# Patient Record
Sex: Male | Born: 1940 | Race: White | Hispanic: No | Smoking: Current every day smoker
Health system: Southern US, Community
[De-identification: ages and names within clinical notes are randomized; demographics above are authoritative.]

## PROBLEM LIST (undated history)

## (undated) DIAGNOSIS — F329 Major depressive disorder, single episode, unspecified: Secondary | ICD-10-CM

## (undated) DIAGNOSIS — F32A Depression, unspecified: Secondary | ICD-10-CM

## (undated) DIAGNOSIS — E785 Hyperlipidemia, unspecified: Secondary | ICD-10-CM

## (undated) DIAGNOSIS — E119 Type 2 diabetes mellitus without complications: Secondary | ICD-10-CM

## (undated) DIAGNOSIS — J189 Pneumonia, unspecified organism: Secondary | ICD-10-CM

## (undated) DIAGNOSIS — I480 Paroxysmal atrial fibrillation: Secondary | ICD-10-CM

## (undated) DIAGNOSIS — J449 Chronic obstructive pulmonary disease, unspecified: Secondary | ICD-10-CM

## (undated) DIAGNOSIS — G4700 Insomnia, unspecified: Secondary | ICD-10-CM

## (undated) DIAGNOSIS — I10 Essential (primary) hypertension: Secondary | ICD-10-CM

## (undated) DIAGNOSIS — M1990 Unspecified osteoarthritis, unspecified site: Secondary | ICD-10-CM

## (undated) HISTORY — DX: Essential (primary) hypertension: I10

## (undated) HISTORY — DX: Hyperlipidemia, unspecified: E78.5

## (undated) HISTORY — PX: REPLACEMENT TOTAL KNEE: SUR1224

## (undated) HISTORY — DX: Insomnia, unspecified: G47.00

## (undated) HISTORY — PX: EYE SURGERY: SHX253

## (undated) HISTORY — DX: Unspecified osteoarthritis, unspecified site: M19.90

## (undated) HISTORY — DX: Major depressive disorder, single episode, unspecified: F32.9

## (undated) HISTORY — DX: Depression, unspecified: F32.A

## (undated) HISTORY — PX: JOINT REPLACEMENT: SHX530

---

## 1998-05-09 DIAGNOSIS — M1990 Unspecified osteoarthritis, unspecified site: Secondary | ICD-10-CM

## 1998-05-09 HISTORY — DX: Unspecified osteoarthritis, unspecified site: M19.90

## 2003-05-10 DIAGNOSIS — G4700 Insomnia, unspecified: Secondary | ICD-10-CM

## 2003-05-10 HISTORY — DX: Insomnia, unspecified: G47.00

## 2009-05-09 HISTORY — PX: OTHER SURGICAL HISTORY: SHX169

## 2011-07-01 ENCOUNTER — Encounter: Payer: Self-pay | Admitting: Physician Assistant

## 2011-07-01 ENCOUNTER — Ambulatory Visit (INDEPENDENT_AMBULATORY_CARE_PROVIDER_SITE_OTHER): Payer: Medicare Other | Admitting: Physician Assistant

## 2011-07-01 VITALS — BP 121/79 | HR 94 | Ht 70.5 in | Wt 205.0 lb

## 2011-07-01 DIAGNOSIS — R1031 Right lower quadrant pain: Secondary | ICD-10-CM

## 2011-07-01 DIAGNOSIS — Z7689 Persons encountering health services in other specified circumstances: Secondary | ICD-10-CM

## 2011-07-01 DIAGNOSIS — Z23 Encounter for immunization: Secondary | ICD-10-CM

## 2011-07-01 DIAGNOSIS — N529 Male erectile dysfunction, unspecified: Secondary | ICD-10-CM

## 2011-07-01 MED ORDER — AMBULATORY NON FORMULARY MEDICATION: 1.0000 | Freq: Once | Status: DC

## 2011-07-01 MED ORDER — TADALAFIL 2.5 MG PO TABS: 1.0000 | ORAL_TABLET | Freq: Every day | ORAL | Status: DC

## 2011-07-01 NOTE — Patient Instructions (Signed)
Gave rx for shingles vaccine to get at local pharmacy. Stop Viagra and start Cialis daily. Recheck in 1 month to see how he is doing. Gave pneumonia vaccine in office.

## 2011-07-01 NOTE — Progress Notes (Signed)
  Subjective:    Patient ID: Raymond Hardin, male    DOB: 1940-10-28, 71 y.o.   MRN: [id number]  HPI Patient present to clinic to establish care. Patient has history of ED, hypertension, PTSD, and chronic back pain. Patient reports that viagra is not working and he wants to switch to Cialis because he found a free trial offer. He is also having pain in his right groin. Certain movements make it worse. Rest makes it better. He has not tried anything to make better. He just got a new puppy and he thinks the extra movement might have strain him. He denies any bulge that increases with lifting. He denies any heavy lifting.    Review of Systems     Objective:   Physical Exam  Constitutional: He is oriented to person, place, and time. He appears well-developed and well-nourished.  HENT:  Head: Normocephalic and atraumatic.  Cardiovascular: Normal rate, regular rhythm, normal heart sounds and intact distal pulses.   Pulmonary/Chest: Effort normal.       Coarse breath sounds.  Genitourinary: Penis normal.       No hernia palpated on either side.   Neurological: He is alert and oriented to person, place, and time.  Psychiatric: He has a normal mood and affect. His behavior is normal.          Assessment & Plan:  Right groin pain- REst, consider heat applied to area and warm baths. Educated to be aware of any buldge in the inguinal area to call if it came out and stayed out not going back in. Heavy lifting can cause hernias so be mindful when lifting.   ED- Cialis prescription given. Discussed side effects of cialis and to never use with viagra. F/U in one month.    Pneumonia Vaccine given. GAve rx to get zostavax.

## 2011-07-29 ENCOUNTER — Ambulatory Visit: Payer: Medicare Other | Admitting: Physician Assistant

## 2011-08-15 ENCOUNTER — Ambulatory Visit: Payer: Medicare Other | Admitting: Physician Assistant

## 2011-09-09 ENCOUNTER — Ambulatory Visit (INDEPENDENT_AMBULATORY_CARE_PROVIDER_SITE_OTHER): Payer: Medicare Other | Admitting: Physician Assistant

## 2011-09-09 ENCOUNTER — Encounter: Payer: Self-pay | Admitting: Physician Assistant

## 2011-09-09 VITALS — BP 138/82 | HR 100 | Ht 70.5 in | Wt 205.0 lb

## 2011-09-09 DIAGNOSIS — N529 Male erectile dysfunction, unspecified: Secondary | ICD-10-CM

## 2011-09-09 DIAGNOSIS — I10 Essential (primary) hypertension: Secondary | ICD-10-CM

## 2011-09-09 MED ORDER — TADALAFIL 2.5 MG PO TABS: 1.0000 | ORAL_TABLET | Freq: Every day | ORAL | Status: DC

## 2011-09-09 NOTE — Progress Notes (Signed)
  Subjective:    Patient ID: Raymond Hardin, male    DOB: Jul 27, 1940, 71 y.o.   MRN: [id number]  HPI Patient presents to the clinic for 1 month follow up on Cialis. He is very happy with results and reports no side effects. Takes Cialis daily and has had no HA's. BP was high today when taken in triage. Recheck manually and got a better reading 138/82. Blood pressure, depression and pain are managed by Dr. Severiano Gilbert and the [place].     Review of Systems     Objective:   Physical Exam  Constitutional: He is oriented to person, place, and time. He appears well-developed and well-nourished.  HENT:  Head: Normocephalic and atraumatic.  Cardiovascular: Normal rate, regular rhythm and normal heart sounds.   Pulmonary/Chest: Effort normal and breath sounds normal. He has no wheezes.       Coarse breath sounds bilaterally.  Neurological: He is alert and oriented to person, place, and time.  Skin: Skin is warm and dry.  Psychiatric: He has a normal mood and affect. His behavior is normal.         Assessment & Plan:  Erectile Dysfunction/Hypertension- Will recheck blood pressure in 2 weeks with nurse visit since on cialis and want to make sure BP stays controlled. Refilled Cialis today. Recheck with me in 6 months.   Zostavax prescription was given at last visit and has not gotten yet/ patient reports he will get and report to office when he does.

## 2011-09-09 NOTE — Patient Instructions (Addendum)
Refilled Cialis to take daily. Want you to come in for nurse visit in 2-4 weeks to recheck blood pressure.

## 2011-09-14 ENCOUNTER — Telehealth: Payer: Self-pay | Admitting: Physician Assistant

## 2011-09-14 NOTE — Telephone Encounter (Signed)
Called patient to see if he wanted to try a preffered drug for ED. I was not able to get an answer and not able to leave msg. He paid cash for cialis. If Patient calls back we need to find out if he has tried any other ED medications or wants to try something the insurance company will pay for.

## 2011-09-30 ENCOUNTER — Ambulatory Visit (INDEPENDENT_AMBULATORY_CARE_PROVIDER_SITE_OTHER): Payer: Medicare Other | Admitting: Physician Assistant

## 2011-09-30 ENCOUNTER — Encounter: Payer: Self-pay | Admitting: Physician Assistant

## 2011-09-30 VITALS — BP 152/92 | HR 100

## 2011-09-30 DIAGNOSIS — I10 Essential (primary) hypertension: Secondary | ICD-10-CM

## 2011-09-30 DIAGNOSIS — M7989 Other specified soft tissue disorders: Secondary | ICD-10-CM

## 2011-09-30 MED ORDER — HYDROCHLOROTHIAZIDE 25 MG PO TABS: 25.0000 mg | ORAL_TABLET | Freq: Every day | ORAL | Status: DC

## 2011-09-30 NOTE — Patient Instructions (Addendum)
Sent to pharmacy HCTZ to take 25mg  daily. Recheck in 1 weeks blood pressure with nurse visit.

## 2011-09-30 NOTE — Progress Notes (Signed)
  Subjective:    Patient ID: Raymond Hardin, male    DOB: 07-06-1940, 71 y.o.   MRN: [id number] BP check: pt is having some ankle swelling x 2 weeks HPI    Review of Systems     Objective:   Physical Exam        Assessment & Plan:  Sent rx for HCTZ 25mg  daily to pharmacy. Follow up in 1 week with nurse visit blood pressure recheck . Tandy Gaw PA-C

## 2011-10-07 ENCOUNTER — Encounter: Payer: Self-pay | Admitting: Physician Assistant

## 2011-10-07 ENCOUNTER — Ambulatory Visit (INDEPENDENT_AMBULATORY_CARE_PROVIDER_SITE_OTHER): Payer: Medicare Other | Admitting: Physician Assistant

## 2011-10-07 VITALS — BP 139/88 | HR 91

## 2011-10-07 DIAGNOSIS — R600 Localized edema: Secondary | ICD-10-CM

## 2011-10-07 DIAGNOSIS — R609 Edema, unspecified: Secondary | ICD-10-CM

## 2011-10-07 DIAGNOSIS — I10 Essential (primary) hypertension: Secondary | ICD-10-CM

## 2011-10-07 MED ORDER — METOPROLOL SUCCINATE ER 25 MG PO TB24: 25.0000 mg | ORAL_TABLET | Freq: Every day | ORAL | Status: DC

## 2011-10-07 NOTE — Progress Notes (Signed)
Pt informed

## 2011-10-07 NOTE — Progress Notes (Signed)
  Subjective:    Patient ID: Raymond Hardin, male    DOB: 1940-05-27, 71 y.o.   MRN: [id number] BP check; swelling has decreased. HPI    Review of Systems     Objective:   Physical Exam        Assessment & Plan:  HTN - BP is better but still borderline. Will add a low dose of metoprolol but he can take at bedtime. F/U with me in 2-3 weeks. We will check a BMP at that time.

## 2011-10-28 ENCOUNTER — Telehealth: Payer: Self-pay | Admitting: *Deleted

## 2011-10-28 DIAGNOSIS — I10 Essential (primary) hypertension: Secondary | ICD-10-CM

## 2011-10-28 NOTE — Telephone Encounter (Signed)
Lab order entered and given to pt

## 2011-10-29 LAB — BASIC METABOLIC PANEL WITH GFR
Calcium: 9 mg/dL (ref 8.4–10.5)
GFR, Est African American: 70 mL/min
Sodium: 139 mEq/L (ref 135–145)

## 2011-11-04 ENCOUNTER — Encounter: Payer: Self-pay | Admitting: Physician Assistant

## 2011-11-04 ENCOUNTER — Ambulatory Visit (INDEPENDENT_AMBULATORY_CARE_PROVIDER_SITE_OTHER): Payer: Medicare Other | Admitting: Physician Assistant

## 2011-11-04 VITALS — BP 111/75 | HR 83 | Ht 70.5 in | Wt 208.0 lb

## 2011-11-04 DIAGNOSIS — N529 Male erectile dysfunction, unspecified: Secondary | ICD-10-CM | POA: Insufficient documentation

## 2011-11-04 DIAGNOSIS — I10 Essential (primary) hypertension: Secondary | ICD-10-CM | POA: Insufficient documentation

## 2011-11-04 MED ORDER — METOPROLOL SUCCINATE ER 25 MG PO TB24: 25.0000 mg | ORAL_TABLET | Freq: Every day | ORAL | Status: DC

## 2011-11-04 MED ORDER — VARDENAFIL HCL 5 MG PO TABS: 5.0000 mg | ORAL_TABLET | ORAL | Status: DC | PRN

## 2011-11-04 MED ORDER — HYDROCHLOROTHIAZIDE 25 MG PO TABS: 25.0000 mg | ORAL_TABLET | Freq: Every day | ORAL | Status: DC

## 2011-11-04 NOTE — Patient Instructions (Addendum)
Try levitra. Let me know how it does. Refilled blood pressure medication. Follow up in 6 months.  [facility] and blood [facility]. Get Shingles vaccine.

## 2011-11-06 NOTE — Progress Notes (Signed)
  Subjective:    Patient ID: Raymond Hardin, male    DOB: 1940-05-14, 71 y.o.   MRN: [id number]  HPI Pt presents to the clinic to follow up on HTN and to talk about cheaper options for ED rather than Cialis. BP doing great today. Pt denies any SOB, CP, HA or palpitations. He is feeling really good. He has not been able to refill Cialis due to cost. He really wants something for ED. He needs his cholesterol checked and cannot remember when he had a regular check up. He goes to [facility] hospital and they usually manage those needs. Pt continues to smoke.  Review of Systems     Objective:   Physical Exam  Constitutional: He is oriented to person, place, and time. He appears well-developed and well-nourished.  HENT:  Head: Normocephalic and atraumatic.  Cardiovascular: Normal rate, regular rhythm, normal heart sounds and intact distal pulses.   Pulmonary/Chest: Effort normal and breath sounds normal. He has no wheezes.  Neurological: He is alert and oriented to person, place, and time.  Skin: Skin is warm and dry.  Psychiatric: He has a normal mood and affect. His behavior is normal.          Assessment & Plan:  HTN- doing great today. Refilled bp medications. Continue with current therapy. Will follow up in 6 months.  ED- Will give rx for levitra to try. Insurance may cover this rx. Will recheck in 1 month to recheck after starting new medication.  Pt was instructed to get CPE either in our office or with [facility]. He needs an up to date cholesterol and diabetes check. Already given rx for shingles shot reminded to get shot. Discussed need to quit smoking.

## 2012-05-07 ENCOUNTER — Ambulatory Visit: Payer: Medicare Other | Admitting: Physician Assistant

## 2012-05-11 ENCOUNTER — Ambulatory Visit: Payer: Medicare Other | Admitting: Physician Assistant

## 2012-05-16 ENCOUNTER — Ambulatory Visit (INDEPENDENT_AMBULATORY_CARE_PROVIDER_SITE_OTHER): Payer: Medicare Other | Admitting: Physician Assistant

## 2012-05-16 ENCOUNTER — Encounter: Payer: Self-pay | Admitting: Physician Assistant

## 2012-05-16 VITALS — BP 146/88 | HR 96 | Ht 70.5 in | Wt 223.0 lb

## 2012-05-16 DIAGNOSIS — I10 Essential (primary) hypertension: Secondary | ICD-10-CM

## 2012-05-16 DIAGNOSIS — N529 Male erectile dysfunction, unspecified: Secondary | ICD-10-CM

## 2012-05-16 MED ORDER — VARDENAFIL HCL 5 MG PO TABS: 10.0000 mg | ORAL_TABLET | ORAL | Status: DC | PRN

## 2012-05-16 NOTE — Patient Instructions (Addendum)
Increase toprol 50mg  daily. BP in 4 weeks.

## 2012-05-16 NOTE — Progress Notes (Signed)
  Subjective:    Patient ID: Raymond Hardin, male    DOB: Dec 28, 1940, 72 y.o.   MRN: [id number]  Hypertension This is a chronic problem. The current episode started more than 1 year ago. The problem has been gradually worsening since onset. The problem is uncontrolled. Pertinent negatives include no anxiety, blurred vision, chest pain, headaches, malaise/fatigue, neck pain, orthopnea, palpitations, peripheral edema, PND, shortness of breath or sweats. Agents associated with hypertension include NSAIDs. Risk factors for coronary artery disease include family history, obesity, male gender, smoking/tobacco exposure and sedentary lifestyle. Past treatments include diuretics, beta blockers and ACE inhibitors. The current treatment provides mild (Was controlled in June 2013.) improvement. Compliance problems include diet and exercise.  There is no history of kidney disease, renovascular disease or retinopathy. There is no history of a hypertension causing med.  Erectile Dysfunction This is a chronic problem. The current episode started more than 1 year ago. The problem is unchanged. The nature of his difficulty is achieving erection and maintaining erection. He reports no anxiety, decreased libido or performance anxiety. Duration of erection: with medication more than . Irritative symptoms do not include frequency, nocturia or urgency. Obstructive symptoms do not include dribbling, an intermittent stream, a slower stream, straining or a weak stream. Pertinent negatives include no chills, dysuria, genital pain, hematuria, hesitancy or inability to urinate. The symptoms are aggravated by alcohol use and medications. Past treatments include vardenafil, tadalafil and sildenafil (Cialis works the best but Orthoptist works also and needs refill). The treatment provided significant relief. He has been using treatment for 6 to 12 months. He has had no adverse reactions caused by medications. Risk factors include  hypertension.      Review of Systems  Constitutional: Negative for chills and malaise/fatigue.  HENT: Negative for neck pain.   Eyes: Negative for blurred vision.  Respiratory: Negative for shortness of breath.   Cardiovascular: Negative for chest pain, palpitations, orthopnea and PND.  Genitourinary: Negative for dysuria, hesitancy, urgency, frequency, hematuria, decreased libido and nocturia.  Neurological: Negative for headaches.       Objective:   Physical Exam  Constitutional: He is oriented to person, place, and time. He appears well-developed and well-nourished.  HENT:  Head: Normocephalic and atraumatic.  Cardiovascular: Normal rate, regular rhythm and normal heart sounds.   Pulmonary/Chest: Effort normal and breath sounds normal. He has no wheezes.  Neurological: He is alert and oriented to person, place, and time.  Skin: Skin is warm and dry.  Psychiatric: He has a normal mood and affect. His behavior is normal.          Assessment & Plan:  Hypertension-increase Toprol XL to 50 mg daily. Patient  or admits to eating a lot of salt. Encouraged patient to decrease sodium in diet into work on regular exercise. Followup in 4 weeks.   ED-new prescription was given for Levitra. Followup as needed.

## 2012-06-13 ENCOUNTER — Ambulatory Visit (INDEPENDENT_AMBULATORY_CARE_PROVIDER_SITE_OTHER): Payer: Medicare Other | Admitting: Physician Assistant

## 2012-06-13 ENCOUNTER — Encounter: Payer: Self-pay | Admitting: Physician Assistant

## 2012-06-13 VITALS — BP 136/76 | HR 97 | Wt 222.0 lb

## 2012-06-13 DIAGNOSIS — I10 Essential (primary) hypertension: Secondary | ICD-10-CM

## 2012-06-13 NOTE — Patient Instructions (Signed)
Can try voltaren up to twice a day. Continue low salt diet. Call me if want to try gel to put on joint areas that are painful.

## 2012-06-13 NOTE — Progress Notes (Signed)
  Subjective:    Patient ID: Raymond Hardin, male    DOB: 1940-09-17, 72 y.o.   MRN: [id number]  Hypertension This is a chronic problem. The current episode started more than 1 year ago. The problem has been gradually improving since onset. The problem is controlled. Pertinent negatives include no anxiety, blurred vision, chest pain, headaches, malaise/fatigue, neck pain, orthopnea, palpitations, peripheral edema, PND, shortness of breath or sweats. Agents associated with hypertension include NSAIDs. Risk factors for coronary artery disease include obesity, male gender, sedentary lifestyle and smoking/tobacco exposure. Past treatments include ACE inhibitors, beta blockers and diuretics. The current treatment provides moderate improvement. Compliance problems include diet and exercise.  There is no history of angina, kidney disease or CAD/MI.      Review of Systems  Constitutional: Negative for malaise/fatigue.  HENT: Negative for neck pain.   Eyes: Negative for blurred vision.  Respiratory: Negative for shortness of breath.   Cardiovascular: Negative for chest pain, palpitations, orthopnea and PND.  Neurological: Negative for headaches.       Objective:   Physical Exam  Constitutional: He is oriented to person, place, and time. He appears well-developed and well-nourished.  HENT:  Head: Normocephalic and atraumatic.  Cardiovascular: Normal rate, regular rhythm and normal heart sounds.   Pulmonary/Chest: Effort normal and breath sounds normal. He has no wheezes.  Neurological: He is alert and oriented to person, place, and time.  Skin: Skin is warm and dry.  Psychiatric: He has a normal mood and affect. His behavior is normal.          Assessment & Plan:  HTN- BP looks better today. Continue with current therapy. Keep low salt diet. Need to start exercising. Follow up 6 months. Keep monitoring BP away from office. Raymond Hardin keep below 140/90.  PT only comes here for HTN and BP. [facility] other conditions.

## 2012-11-15 ENCOUNTER — Other Ambulatory Visit: Payer: Self-pay | Admitting: *Deleted

## 2012-11-15 ENCOUNTER — Telehealth: Payer: Self-pay | Admitting: Physician Assistant

## 2012-11-15 ENCOUNTER — Telehealth: Payer: Self-pay | Admitting: *Deleted

## 2012-11-15 NOTE — Telephone Encounter (Signed)
Pt was in office 2 times today stating that [facility] had sent a refill request for cialis.  I spoke with pharmacy & they confirmed but no one saw the request.  My hesitation is that according to his chart Lesly Rubenstein gave him levitra but he was on cialis last year.  Are you ok with me filling this for him? Please advise

## 2012-11-15 NOTE — Telephone Encounter (Signed)
Patient came by to check status of his medication (cialis) to [facility] pharmacy. He said that they faxed request already.  I told him i would send you message and someone could call him back with status.  Number is [phone].  thanks

## 2012-11-16 NOTE — Telephone Encounter (Signed)
Okay to fill. Just make sure that he is not taking them at the same time or mixing them.

## 2012-12-12 ENCOUNTER — Ambulatory Visit (INDEPENDENT_AMBULATORY_CARE_PROVIDER_SITE_OTHER): Payer: Medicare HMO | Admitting: Physician Assistant

## 2012-12-12 ENCOUNTER — Telehealth: Payer: Self-pay | Admitting: *Deleted

## 2012-12-12 ENCOUNTER — Encounter: Payer: Self-pay | Admitting: Physician Assistant

## 2012-12-12 VITALS — BP 134/85 | HR 85 | Wt 215.0 lb

## 2012-12-12 DIAGNOSIS — Z720 Tobacco use: Secondary | ICD-10-CM

## 2012-12-12 DIAGNOSIS — R5383 Other fatigue: Secondary | ICD-10-CM

## 2012-12-12 DIAGNOSIS — K439 Ventral hernia without obstruction or gangrene: Secondary | ICD-10-CM

## 2012-12-12 DIAGNOSIS — R5381 Other malaise: Secondary | ICD-10-CM

## 2012-12-12 DIAGNOSIS — F329 Major depressive disorder, single episode, unspecified: Secondary | ICD-10-CM

## 2012-12-12 DIAGNOSIS — I10 Essential (primary) hypertension: Secondary | ICD-10-CM

## 2012-12-12 DIAGNOSIS — F17200 Nicotine dependence, unspecified, uncomplicated: Secondary | ICD-10-CM

## 2012-12-12 MED ORDER — SERTRALINE HCL 100 MG PO TABS: 50.0000 mg | ORAL_TABLET | Freq: Every day | ORAL | Status: DC

## 2012-12-12 MED ORDER — BUPROPION HCL ER (XL) 150 MG PO TB24: 150.0000 mg | ORAL_TABLET | Freq: Every day | ORAL | Status: DC

## 2012-12-12 NOTE — Telephone Encounter (Signed)
Liborio Nixon from [facility] called stating that they are not in network with [employer] & the pt will have to pay a $226 deposit to come to their office.

## 2012-12-12 NOTE — Telephone Encounter (Signed)
Can we call around and see if anyone takes [facility] for general surgery referral.

## 2012-12-12 NOTE — Addendum Note (Signed)
Addended by: Jomarie Longs on: 12/12/2012 11:13 AM   Modules accepted: Level of Service

## 2012-12-12 NOTE — Progress Notes (Signed)
  Subjective:    Patient ID: Raymond Hardin, male    DOB: [birth year], 72 y.o.   MRN: [id number]  HPI Patient is a 72 year old man who presents to the clinic to followup on hypertension.  Hypertension is well controlled today on a Natrecor L. Patient denies any chest pains, palpitations, shortness of breath, headache, vision changes. He continues to take his medication regularly.  Patient has had a bulging of his abdominal wall for many years now but it continues to worry him. He has had previous PCP spell him it is nothing to worry about. He denies any pain in the abdominal area. He is still able to reduce bulge.   Patient is very tired today. He reports that he does feel more depressed. She's been on Zoloft 100 mg for many years and has worked well. He lives with his 76 year old sister who wants him a lot. He says that he sleeps a lot of the day now and doesn't have as much pleasure in doing things that he once did. He still rides his motorcycle regularly and does find that son. Sometimes he admits to feeling like he would be better off dead. He does admit to trying the Xanax from his sisters and it helped.   Patient has decreased on smoking. He smokes about half pack a day currently. He has no interesting completely quitting.   Review of Systems     Objective:   Physical Exam  Constitutional: He is oriented to person, place, and time. He appears well-developed and well-nourished.  HENT:  Head: Normocephalic and atraumatic.  Cardiovascular: Normal rate, regular rhythm and normal heart sounds.   Pulmonary/Chest: Effort normal and breath sounds normal.  Abdominal: Soft. Bowel sounds are normal.  Soft reducible bulge from umbilicus about 6 cm up following central abdominal crease.  Neurological: He is alert and oriented to person, place, and time.  Skin: Skin is warm and dry.  Psychiatric: His behavior is normal.  Patient's affect was flat today.          Assessment & Plan:   Hypertension-patient reports he does not need any refills but encouraged patient blood pressure looked good. Would like to check a CMP make sure kidney and liver are in check.   Depression/fatigue- it could be that depression is making patient feel more fatigued. PHQ-9 was 9. I did decide to add Wellbutrin as a potential activator with the Zoloft daily. Will recheck in 6 weeks. I did go ahead and order a fatigue panel with a CBC, vitamin D, B12, TSH, and testosterone.  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 This seems to worry patients I stated I would make a General surgery referral to have surgeon evaluate.  Tobacco use-patient encouraged to continue to decrease cigarettes until he has stopped smoking.

## 2012-12-12 NOTE — Patient Instructions (Addendum)
Start Wellbutrin daily in the morning.   Will call with labs.

## 2012-12-13 LAB — VITAMIN D 25 HYDROXY (VIT D DEFICIENCY, FRACTURES): Vit D, 25-Hydroxy: 32 ng/mL (ref 30–89)

## 2012-12-13 LAB — COMPLETE METABOLIC PANEL WITH GFR
ALT: 10 U/L (ref 0–53)
AST: 16 U/L (ref 0–37)
Creat: 1.03 mg/dL (ref 0.50–1.35)
Total Bilirubin: 0.4 mg/dL (ref 0.3–1.2)

## 2012-12-13 LAB — CBC
HCT: 44.2 % (ref 39.0–52.0)
Hemoglobin: 14.3 g/dL (ref 13.0–17.0)
RBC: 4.61 MIL/uL (ref 4.22–5.81)

## 2012-12-13 LAB — TESTOSTERONE, FREE, TOTAL, SHBG
Testosterone, Free: 67.8 pg/mL (ref 47.0–244.0)
Testosterone-% Free: 1.7 % (ref 1.6–2.9)

## 2012-12-13 LAB — VITAMIN B12: Vitamin B-12: 319 pg/mL (ref 211–911)

## 2013-01-23 ENCOUNTER — Ambulatory Visit (INDEPENDENT_AMBULATORY_CARE_PROVIDER_SITE_OTHER): Payer: Medicare HMO | Admitting: Physician Assistant

## 2013-01-23 ENCOUNTER — Encounter: Payer: Self-pay | Admitting: Physician Assistant

## 2013-01-23 VITALS — BP 139/85 | HR 90 | Wt 212.0 lb

## 2013-01-23 DIAGNOSIS — R454 Irritability and anger: Secondary | ICD-10-CM

## 2013-01-23 DIAGNOSIS — F329 Major depressive disorder, single episode, unspecified: Secondary | ICD-10-CM

## 2013-01-23 DIAGNOSIS — Z23 Encounter for immunization: Secondary | ICD-10-CM

## 2013-01-23 DIAGNOSIS — F911 Conduct disorder, childhood-onset type: Secondary | ICD-10-CM

## 2013-01-23 MED ORDER — SERTRALINE HCL 100 MG PO TABS: ORAL_TABLET | ORAL | Status: DC

## 2013-01-23 MED ORDER — CLONAZEPAM 0.5 MG PO TABS: 0.5000 mg | ORAL_TABLET | Freq: Two times a day (BID) | ORAL | Status: DC | PRN

## 2013-01-23 MED ORDER — BUPROPION HCL ER (XL) 150 MG PO TB24: 150.0000 mg | ORAL_TABLET | Freq: Every day | ORAL | Status: DC

## 2013-01-23 NOTE — Progress Notes (Signed)
  Subjective:    Patient ID: Raymond Hardin, male    DOB: 1940/09/05, 72 y.o.   MRN: [id number]  HPI Patient is a 72 year old man who presents to the clinic following up on depression. He reports today that he feels much happier and under control as long as he is not in communication with his sister. His sister is older than him and he lives with her. He states that he becomes very easily agitated every time she exhibits present. She continually yells at him and talks about him. He often has extreme outbursts of anger when he is having conversations with her. He is considering moving out. He denies thinking about harming her or himself. Patient asked about Xanax and if he should have a prescription.   Review of Systems     Objective:   Physical Exam  Constitutional: He appears well-developed and well-nourished.  Cardiovascular: Normal rate, regular rhythm and normal heart sounds.   Pulmonary/Chest: Effort normal and breath sounds normal.  Psychiatric: He has a normal mood and affect. His behavior is normal.          Assessment & Plan:  Depression/outbursts of anger-PHQ-9 was 7 which has decreased from 9. I will keep on current therapy of Wellbutrin and Zoloft. If sister is the culprit I suggest looking for a place. He is worried about finances. I gave him a name of the place that seems more affordable close to where he currently lives. I discussed with patient 10 minutes and the side effects. I do not think that Xanax is a good medication for patient. I did talk about Klonopin and the extended half life. We will do a trial of Klonopin only as needed. Patient made aware that this is only to use daily and should not be used every day. I do not want patient to drink or drive with Klonopin and system.  Followup 3 months.   Flu shot given today without complication.  Patient gets labs done at [place]. Instructed patient to give [place] copy of fasting labs.

## 2013-01-23 NOTE — Patient Instructions (Signed)
Address: [address], [place], [place] [address]  [phone]

## 2013-04-24 ENCOUNTER — Ambulatory Visit (INDEPENDENT_AMBULATORY_CARE_PROVIDER_SITE_OTHER): Payer: Medicare HMO | Admitting: Physician Assistant

## 2013-04-24 ENCOUNTER — Encounter: Payer: Self-pay | Admitting: Physician Assistant

## 2013-04-24 VITALS — BP 130/73 | HR 79 | Wt 218.0 lb

## 2013-04-24 DIAGNOSIS — M1711 Unilateral primary osteoarthritis, right knee: Secondary | ICD-10-CM | POA: Insufficient documentation

## 2013-04-24 DIAGNOSIS — N529 Male erectile dysfunction, unspecified: Secondary | ICD-10-CM

## 2013-04-24 DIAGNOSIS — M1710 Unilateral primary osteoarthritis, unspecified knee: Secondary | ICD-10-CM

## 2013-04-24 DIAGNOSIS — I10 Essential (primary) hypertension: Secondary | ICD-10-CM

## 2013-04-24 DIAGNOSIS — M170 Bilateral primary osteoarthritis of knee: Secondary | ICD-10-CM

## 2013-04-24 DIAGNOSIS — F329 Major depressive disorder, single episode, unspecified: Secondary | ICD-10-CM

## 2013-04-24 MED ORDER — MELOXICAM 15 MG PO TABS: 15.0000 mg | ORAL_TABLET | Freq: Every day | ORAL | Status: DC

## 2013-04-24 MED ORDER — VORTIOXETINE HBR 10 MG PO TABS: 10.0000 mg | ORAL_TABLET | Freq: Every day | ORAL | Status: DC

## 2013-04-24 NOTE — Progress Notes (Signed)
   Subjective:    Patient ID: Raymond Hardin, male    DOB: [birth year], 72 y.o.   MRN: [id number]  HPI Patient is a 72 year old male who presents to the clinic to followup on depression. Patient currently takes Wellbutrin and Zoloft. He had previously been doing much better however the last 3 months he definitely feels more down. He is still living with his sister which is a point of contention in a lot of arguments. He is also down because of his bilateral knee pain from osteo-arthritis. He has seen orthopedic and they have done injections but they don't seem to help. He has been seen by Dr.Peele. He is not currently on any anti-inflammatories for pain. He does use oxycodone as needed for knee pain. The doctor at the [facility] gives him his oxycodone. Patient does not feels like he can't do all that he would like to do. He does report that he has had thoughts that he would be better off dead but has never made a plan.  ED-patient cannot afford prescriptions for erectile dysfunction he does asked for samples today.   Review of Systems     Objective:   Physical Exam  Constitutional: He is oriented to person, place, and time. He appears well-developed and well-nourished.  HENT:  Head: Normocephalic and atraumatic.  Cardiovascular: Normal rate, regular rhythm and normal heart sounds.   Pulmonary/Chest: Effort normal and breath sounds normal.  Neurological: He is alert and oriented to person, place, and time.  Skin: Skin is warm and dry.  Psychiatric: He has a normal mood and affect. His behavior is normal.          Assessment & Plan:  Depression- PHQ-9 was 9. I would like to change patient's medications up today but concerned that he will not on medications due to cost. I gave him a card and prescription for Brintellix 10mg  daily. I would like for him to see how much this would cost him. I did give him samples to take for 7 days to see if he didn't notice any benefit. I told him most likely he  would need to take longer than 7 days to make sure he would not seeing improvement. It starts up until a patient was advised to stop Wellbutrin and Zoloft. When considering if he can afford medications I reminded him to consider the to co-pays he would not have to pay anymore. Followup in 6 weeks. Discuss if he were to have suicidal thoughts to call about one or call our office and let us know how he is feeling.  Bilateral knee osteoarthritis-will make appointment for Dr. Karie Schwalbe. in office to see if he can make any headway with his knee pain. In the meantime I did give Mobic to take daily to see if an anti-inflammatory would help some of the pain.  ED-one sample of Cialis was given.

## 2013-04-24 NOTE — Patient Instructions (Addendum)
Start mobic for knee pain. Will send to Dr. Karie Schwalbe to look at bilateral knee pain.   Stop wellbutrin and zoloft. Start 10mg  of Brintellix.

## 2013-05-14 ENCOUNTER — Encounter: Payer: Self-pay | Admitting: Sports Medicine

## 2013-05-14 ENCOUNTER — Ambulatory Visit (INDEPENDENT_AMBULATORY_CARE_PROVIDER_SITE_OTHER): Payer: Medicare HMO

## 2013-05-14 ENCOUNTER — Ambulatory Visit (INDEPENDENT_AMBULATORY_CARE_PROVIDER_SITE_OTHER): Payer: Medicare HMO | Admitting: Sports Medicine

## 2013-05-14 VITALS — BP 145/87 | HR 108 | Wt 219.0 lb

## 2013-05-14 DIAGNOSIS — M1710 Unilateral primary osteoarthritis, unspecified knee: Secondary | ICD-10-CM

## 2013-05-14 DIAGNOSIS — M170 Bilateral primary osteoarthritis of knee: Secondary | ICD-10-CM

## 2013-05-14 DIAGNOSIS — IMO0002 Reserved for concepts with insufficient information to code with codable children: Secondary | ICD-10-CM

## 2013-05-14 MED ORDER — MELOXICAM 15 MG PO TABS: ORAL_TABLET | ORAL | Status: DC

## 2013-05-14 NOTE — Progress Notes (Signed)
Subjective:    I'm seeing this patient as a consultation for:  Raymond Planas, PA-C  CC: Knee osteoarthritis  HPI: This is a very pleasant 73 year old male, he has a history of severe knee arthritis, his last injection provided about 3 days of response and was about a year ago. He now has recurrent pain he localizes along the medial and lateral joint lines, worse with weightbearing, moderate, persistent, associated with swelling but no mechanical symptoms. He was referred to me for further evaluation and definitive treatment.  Past medical history, Surgical history, Family history not pertinant except as noted below, Social history, Allergies, and medications have been entered into the medical record, reviewed, and no changes needed.   Review of Systems: No headache, visual changes, nausea, vomiting, diarrhea, constipation, dizziness, abdominal pain, skin rash, fevers, chills, night sweats, weight loss, swollen lymph nodes, body aches, joint swelling, muscle aches, chest pain, shortness of breath, mood changes, visual or auditory hallucinations.   Objective:   General: Well Developed, well nourished, and in no acute distress.  Neuro/Psych: Alert and oriented x3, extra-ocular muscles intact, able to move all 4 extremities, sensation grossly intact. Skin: Warm and dry, no rashes noted.  Respiratory: Not using accessory muscles, speaking in full sentences, trachea midline.  Cardiovascular: Pulses palpable, no extremity edema. Abdomen: Does not appear distended. Bilateral Knee: Visible and palpable swelling and effusion with fluid wave, tender to palpation over the medial and lateral joint lines of both knees. ROM full in flexion and extension and lower leg rotation. Ligaments with solid consistent endpoints including ACL, PCL, LCL, MCL. Negative Mcmurray's, Apley's, and Thessalonian tests. Non painful patellar compression. Patellar glide without crepitus. Patellar and quadriceps tendons  unremarkable. Hamstring and quadriceps strength is normal.   Procedure: Real-time Ultrasound Guided aspiration/Injection of left knee Device: GE Logiq E  Verbal informed consent obtained.  Time-out conducted.  Noted no overlying erythema, induration, or other signs of local infection.  Skin prepped in a sterile fashion.  Local anesthesia: Topical Ethyl chloride.  With sterile technique and under real time ultrasound guidance:  22-gauge needle advanced into the suprapatellar recess, a total of 10 cc of straw-colored fluid is aspirated, syringe switched and 2 cc Kenalog 40, 4 cc lidocaine injected, syringe again switched and 25 mg/2.5 mL of Supartz (sodium hyaluronate) in a prefilled syringe was injected easily into the knee through a 22-gauge needle. Completed without difficulty  Pain immediately resolved suggesting accurate placement of the medication.  Advised to call if fevers/chills, erythema, induration, drainage, or persistent bleeding.  Images permanently stored and available for review in the ultrasound unit.  Impression: Technically successful ultrasound guided injection.  Procedure: Real-time Ultrasound Guided aspiration/Injection of right knee Device: GE Logiq E  Verbal informed consent obtained.  Time-out conducted.  Noted no overlying erythema, induration, or other signs of local infection.  Skin prepped in a sterile fashion.  Local anesthesia: Topical Ethyl chloride.  With sterile technique and under real time ultrasound guidance:  22-gauge needle advanced into the suprapatellar recess, a total of 20 cc of straw-colored fluid is aspirated, syringe switched and 2 cc Kenalog 40, 4 cc lidocaine injected, syringe again switched and 25 mg/2.5 mL of Supartz (sodium hyaluronate) in a prefilled syringe was injected easily into the knee through a 22-gauge needle. Completed without difficulty  Pain immediately resolved suggesting accurate placement of the medication.  Advised to call if  fevers/chills, erythema, induration, drainage, or persistent bleeding.  Images permanently stored and available for review in the  ultrasound unit.  Impression: Technically successful ultrasound guided injection.  Impression and Recommendations:   This case required medical decision making of moderate complexity.  I spent 40 minutes with this patient, greater than 50% was face-to-face time counseling regarding the diagnosis of osteoarthritis.

## 2013-05-14 NOTE — Assessment & Plan Note (Addendum)
Last intra-articular steroid injection was done one year ago and gave him several days of improvement. I am going to start viscous supplementation. I'm also going to have him go through formal physical therapy for his knees. Mobic. Today we did an aspiration and injection of steroid and Supartz, into both knees. Return to see me in one week for Supartz injection #2 of 5 into both knees.

## 2013-05-20 ENCOUNTER — Ambulatory Visit: Payer: Medicare HMO | Admitting: Physical Therapy

## 2013-05-21 ENCOUNTER — Encounter: Payer: Self-pay | Admitting: Sports Medicine

## 2013-05-21 ENCOUNTER — Ambulatory Visit (INDEPENDENT_AMBULATORY_CARE_PROVIDER_SITE_OTHER): Payer: Medicare HMO | Admitting: Sports Medicine

## 2013-05-21 VITALS — BP 143/79 | HR 93 | Wt 217.0 lb

## 2013-05-21 DIAGNOSIS — M170 Bilateral primary osteoarthritis of knee: Secondary | ICD-10-CM

## 2013-05-21 DIAGNOSIS — M1710 Unilateral primary osteoarthritis, unspecified knee: Secondary | ICD-10-CM

## 2013-05-21 MED ORDER — OXYCODONE HCL 5 MG PO TABS: 5.0000 mg | ORAL_TABLET | ORAL | Status: DC | PRN

## 2013-05-21 NOTE — Progress Notes (Signed)
  Procedure: Real-time Ultrasound Guided Injection of left knee Device: GE Logiq E  Verbal informed consent obtained.  Time-out conducted.  Noted no overlying erythema, induration, or other signs of local infection.  Skin prepped in a sterile fashion.  Local anesthesia: Topical Ethyl chloride.  With sterile technique and under real time ultrasound guidance:  Aspirated 10 cc of thick, straw-colored fluid, syringe switched and 25 mg/2.5 mL of Supartz (sodium hyaluronate) in a prefilled syringe was injected easily into the knee through a 22-gauge needle. Completed without difficulty  Pain immediately resolved suggesting accurate placement of the medication.  Advised to call if fevers/chills, erythema, induration, drainage, or persistent bleeding.  Images permanently stored and available for review in the ultrasound unit.  Impression: Technically successful ultrasound guided injection.  Procedure: Real-time Ultrasound Guided Injection of right knee Device: GE Logiq E  Verbal informed consent obtained.  Time-out conducted.  Noted no overlying erythema, induration, or other signs of local infection.  Skin prepped in a sterile fashion.  Local anesthesia: Topical Ethyl chloride.  With sterile technique and under real time ultrasound guidance:  Aspirated 5 cc of straw-colored fluid, syringe switched and 25 mg/2.5 mL of Supartz (sodium hyaluronate) in a prefilled syringe was injected easily into the knee through a 22-gauge needle.  Completed without difficulty  Pain immediately resolved suggesting accurate placement of the medication.  Advised to call if fevers/chills, erythema, induration, drainage, or persistent bleeding.  Images permanently stored and available for review in the ultrasound unit.  Impression: Technically successful ultrasound guided injection.

## 2013-05-21 NOTE — Assessment & Plan Note (Signed)
Supartz injection #2 of 5 into both knees. Return in one week for #3. Right knee is pain-free, left knee still hurts a little bit. Small supply of oxycodone given.

## 2013-05-22 ENCOUNTER — Ambulatory Visit: Payer: Medicare HMO | Admitting: Physician Assistant

## 2013-05-27 ENCOUNTER — Ambulatory Visit: Payer: Commercial Managed Care - HMO | Admitting: Physical Therapy

## 2013-05-28 ENCOUNTER — Encounter: Payer: Self-pay | Admitting: Sports Medicine

## 2013-05-28 ENCOUNTER — Ambulatory Visit (INDEPENDENT_AMBULATORY_CARE_PROVIDER_SITE_OTHER): Payer: Medicare HMO | Admitting: Sports Medicine

## 2013-05-28 VITALS — BP 153/78 | HR 92 | Wt 226.0 lb

## 2013-05-28 DIAGNOSIS — M1710 Unilateral primary osteoarthritis, unspecified knee: Secondary | ICD-10-CM

## 2013-05-28 DIAGNOSIS — M170 Bilateral primary osteoarthritis of knee: Secondary | ICD-10-CM

## 2013-05-28 NOTE — Progress Notes (Signed)
  Procedure: Real-time Ultrasound Guided Injection of left knee Device: GE Logiq E  Verbal informed consent obtained.  Time-out conducted.  Noted no overlying erythema, induration, or other signs of local infection.  Skin prepped in a sterile fashion.  Local anesthesia: Topical Ethyl chloride.  With sterile technique and under real time ultrasound guidance:  Aspirated 15 cc of thick, straw-colored fluid, syringe switched and 25 mg/2.5 mL of Supartz (sodium hyaluronate) in a prefilled syringe was injected easily into the knee through a 22-gauge needle. Completed without difficulty  Pain immediately resolved suggesting accurate placement of the medication.  Advised to call if fevers/chills, erythema, induration, drainage, or persistent bleeding.  Images permanently stored and available for review in the ultrasound unit.  Impression: Technically successful ultrasound guided injection.  Procedure: Real-time Ultrasound Guided Injection of right knee Device: GE Logiq E  Verbal informed consent obtained.  Time-out conducted.  Noted no overlying erythema, induration, or other signs of local infection.  Skin prepped in a sterile fashion.  Local anesthesia: Topical Ethyl chloride.  With sterile technique and under real time ultrasound guidance:  Aspirated 7 cc of straw-colored fluid, syringe switched and 25 mg/2.5 mL of Supartz (sodium hyaluronate) in a prefilled syringe was injected easily into the knee through a 22-gauge needle.  Completed without difficulty  Pain immediately resolved suggesting accurate placement of the medication.  Advised to call if fevers/chills, erythema, induration, drainage, or persistent bleeding.  Images permanently stored and available for review in the ultrasound unit.  Impression: Technically successful ultrasound guided injection.

## 2013-05-28 NOTE — Assessment & Plan Note (Signed)
Supartz injection #3 of 5 into both knees. Return in one week.

## 2013-05-29 ENCOUNTER — Ambulatory Visit (INDEPENDENT_AMBULATORY_CARE_PROVIDER_SITE_OTHER): Payer: Medicare HMO | Admitting: Physician Assistant

## 2013-05-29 ENCOUNTER — Encounter: Payer: Self-pay | Admitting: Physician Assistant

## 2013-05-29 VITALS — BP 147/82 | HR 99 | Wt 220.0 lb

## 2013-05-29 DIAGNOSIS — F329 Major depressive disorder, single episode, unspecified: Secondary | ICD-10-CM

## 2013-05-29 DIAGNOSIS — F32A Depression, unspecified: Secondary | ICD-10-CM

## 2013-05-29 DIAGNOSIS — F3289 Other specified depressive episodes: Secondary | ICD-10-CM

## 2013-05-29 DIAGNOSIS — I10 Essential (primary) hypertension: Secondary | ICD-10-CM

## 2013-05-29 MED ORDER — SERTRALINE HCL 100 MG PO TABS: ORAL_TABLET | ORAL | Status: DC

## 2013-05-29 MED ORDER — BUPROPION HCL ER (XL) 300 MG PO TB24: 300.0000 mg | ORAL_TABLET | Freq: Every day | ORAL | Status: DC

## 2013-05-29 NOTE — Patient Instructions (Signed)
Follow up [place] for hypertension.   Follow up in 2 months.

## 2013-05-29 NOTE — Progress Notes (Signed)
   Subjective:    Patient ID: Raymond Hardin, male    DOB: [birth year], 73 y.o.   MRN: [id number]  HPI Patient is a 73 year old man who presents to the clinic to followup on depression. He admits he has not started Brintellix a new medication. He wanted to finish taking all of his Zoloft and Wellbutrin before starting the medication. He did take medication to pharmacy and is going to be over $50. He is not wanting to pay that much for depression medication. He does so like the warmer weather in sunshine has made him feel better. He would like to go back on cheaper medications.   Review of Systems     Objective:   Physical Exam  Constitutional: He appears well-developed and well-nourished.  HENT:  Head: Normocephalic and atraumatic.  Cardiovascular: Normal rate, regular rhythm and normal heart sounds.   Pulmonary/Chest: Effort normal and breath sounds normal.  Skin: Skin is warm and dry.  Psychiatric: He has a normal mood and affect. His behavior is normal.          Assessment & Plan:  Depression- PHQ-9 was 2. This is improved since last visit with score of 9. Pt has not started Brintellix. Took off medication list. Decided to up wellbutrin to 300mg  and keep on zoloft 100mg  1 and 1/2 tablet daily. Encouraged regular exercise and doing hobbies that he likes. PHQ-9 was very reassuring. Follow up in 2 months.   HTN- BP is elevated today. [facility]. [facility]ed with [facility]>

## 2013-06-04 ENCOUNTER — Ambulatory Visit: Payer: Medicare HMO | Admitting: Sports Medicine

## 2013-06-11 ENCOUNTER — Ambulatory Visit (INDEPENDENT_AMBULATORY_CARE_PROVIDER_SITE_OTHER): Payer: Medicare HMO | Admitting: Sports Medicine

## 2013-06-11 ENCOUNTER — Encounter: Payer: Self-pay | Admitting: Sports Medicine

## 2013-06-11 VITALS — BP 163/95 | HR 101 | Ht 70.0 in | Wt 221.0 lb

## 2013-06-11 DIAGNOSIS — M170 Bilateral primary osteoarthritis of knee: Secondary | ICD-10-CM

## 2013-06-11 DIAGNOSIS — M1710 Unilateral primary osteoarthritis, unspecified knee: Secondary | ICD-10-CM

## 2013-06-11 MED ORDER — OXYCODONE HCL 5 MG PO TABS: 5.0000 mg | ORAL_TABLET | ORAL | Status: DC | PRN

## 2013-06-11 NOTE — Progress Notes (Signed)
   Procedure: Real-time Ultrasound Guided Injection of left knee Device: GE Logiq E  Verbal informed consent obtained.  Time-out conducted.  Noted no overlying erythema, induration, or other signs of local infection.  Skin prepped in a sterile fashion.  Local anesthesia: Topical Ethyl chloride.  With sterile technique and under real time ultrasound guidance:  Aspirated 27cc of straw-colored fluid, syringe switched and 25 mg/2.5 mL of Supartz (sodium hyaluronate) in a prefilled syringe was injected easily into the knee through a 22-gauge needle. Completed without difficulty  Pain immediately resolved suggesting accurate placement of the medication.  Advised to call if fevers/chills, erythema, induration, drainage, or persistent bleeding.  Images permanently stored and available for review in the ultrasound unit.  Impression: Technically successful ultrasound guided injection.  Procedure: Real-time Ultrasound Guided Injection of right knee Device: GE Logiq E  Verbal informed consent obtained.  Time-out conducted.  Noted no overlying erythema, induration, or other signs of local infection.  Skin prepped in a sterile fashion.  Local anesthesia: Topical Ethyl chloride.  With sterile technique and under real time ultrasound guidance:  25 mg/2.5 mL of Supartz (sodium hyaluronate) in a prefilled syringe was injected easily into the knee through a 22-gauge needle.  Completed without difficulty  Pain immediately resolved suggesting accurate placement of the medication.  Advised to call if fevers/chills, erythema, induration, drainage, or persistent bleeding.  Images permanently stored and available for review in the ultrasound unit.  Impression: Technically successful ultrasound guided injection.

## 2013-06-11 NOTE — Assessment & Plan Note (Signed)
Supartz injection # 4 of 5 into both knees. Return in one week for number 5. 70% improvement since initial injection

## 2013-06-18 ENCOUNTER — Ambulatory Visit (INDEPENDENT_AMBULATORY_CARE_PROVIDER_SITE_OTHER): Payer: Medicare HMO | Admitting: Sports Medicine

## 2013-06-18 ENCOUNTER — Encounter: Payer: Self-pay | Admitting: Sports Medicine

## 2013-06-18 VITALS — BP 160/85 | HR 93 | Ht 70.0 in | Wt 221.0 lb

## 2013-06-18 DIAGNOSIS — M1710 Unilateral primary osteoarthritis, unspecified knee: Secondary | ICD-10-CM

## 2013-06-18 DIAGNOSIS — M170 Bilateral primary osteoarthritis of knee: Secondary | ICD-10-CM

## 2013-06-18 NOTE — Assessment & Plan Note (Signed)
Fifth and final Supartz injection placed into both knees. Return in one month.

## 2013-06-18 NOTE — Progress Notes (Signed)
   Procedure: Real-time Ultrasound Guided Injection of left knee Device: GE Logiq E  Verbal informed consent obtained.  Time-out conducted.  Noted no overlying erythema, induration, or other signs of local infection.  Skin prepped in a sterile fashion.  Local anesthesia: Topical Ethyl chloride.  With sterile technique and under real time ultrasound guidance:  26 cc of straw-colored fluid was aspirated, syringe switched and 1 cc Kenalog 40, 2 cc lidocaine injected, syringe again switched and 25 mg/2.5 mL of Supartz (sodium hyaluronate) in a prefilled syringe was injected easily into the knee through a 22-gauge needle. Completed without difficulty  Pain immediately resolved suggesting accurate placement of the medication.  Advised to call if fevers/chills, erythema, induration, drainage, or persistent bleeding.  Images permanently stored and available for review in the ultrasound unit.  Impression: Technically successful ultrasound guided injection.  Procedure: Real-time Ultrasound Guided Injection of right knee Device: GE Logiq E  Verbal informed consent obtained.  Time-out conducted.  Noted no overlying erythema, induration, or other signs of local infection.  Skin prepped in a sterile fashion.  Local anesthesia: Topical Ethyl chloride.  With sterile technique and under real time ultrasound guidance:   25 mg/2.5 mL of Supartz (sodium hyaluronate) in a prefilled syringe was injected easily into the knee through a 22-gauge needle. Completed without difficulty  Pain immediately resolved suggesting accurate placement of the medication.  Advised to call if fevers/chills, erythema, induration, drainage, or persistent bleeding.  Images permanently stored and available for review in the ultrasound unit.  Impression: Technically successful ultrasound guided injection.

## 2013-07-16 ENCOUNTER — Ambulatory Visit (INDEPENDENT_AMBULATORY_CARE_PROVIDER_SITE_OTHER): Payer: Medicare HMO | Admitting: Sports Medicine

## 2013-07-16 ENCOUNTER — Encounter: Payer: Self-pay | Admitting: Sports Medicine

## 2013-07-16 VITALS — BP 155/86 | HR 118 | Ht 70.0 in | Wt 224.0 lb

## 2013-07-16 DIAGNOSIS — M170 Bilateral primary osteoarthritis of knee: Secondary | ICD-10-CM

## 2013-07-16 DIAGNOSIS — M1710 Unilateral primary osteoarthritis, unspecified knee: Secondary | ICD-10-CM

## 2013-07-16 NOTE — Assessment & Plan Note (Signed)
Pain-free after Supartz series. Return as needed, we can repeat this every 6 months as needed.

## 2013-07-16 NOTE — Progress Notes (Signed)
  Subjective:    CC: Followup  HPI: Bilateral knee osteoarthritis: Has now finished a full series of Supartz and is pain-free.  Past medical history, Surgical history, Family history not pertinant except as noted below, Social history, Allergies, and medications have been entered into the medical record, reviewed, and no changes needed.   Review of Systems: No fevers, chills, night sweats, weight loss, chest pain, or shortness of breath.   Objective:    General: Well Developed, well nourished, and in no acute distress.  Neuro: Alert and oriented x3, extra-ocular muscles intact, sensation grossly intact.  HEENT: Normocephalic, atraumatic, pupils equal round reactive to light, neck supple, no masses, no lymphadenopathy, thyroid nonpalpable.  Skin: Warm and dry, no rashes. Cardiac: Regular rate and rhythm, no murmurs rubs or gallops, no lower extremity edema.  Respiratory: Clear to auscultation bilaterally. Not using accessory muscles, speaking in full sentences. Bilateral Knee: Normal to inspection with no erythema or effusion or obvious bony abnormalities. Palpation normal with no warmth, joint line tenderness, patellar tenderness, or condyle tenderness. ROM full in flexion and extension and lower leg rotation. Ligaments with solid consistent endpoints including ACL, PCL, LCL, MCL. Negative Mcmurray's, Apley's, and Thessalonian tests. Non painful patellar compression. Patellar glide without crepitus. Patellar and quadriceps tendons unremarkable. Hamstring and quadriceps strength is normal.   Impression and Recommendations:

## 2013-07-23 ENCOUNTER — Telehealth: Payer: Self-pay

## 2013-07-23 MED ORDER — OXYCODONE HCL 5 MG PO TABS: 5.0000 mg | ORAL_TABLET | ORAL | Status: DC | PRN

## 2013-07-23 NOTE — Telephone Encounter (Signed)
Patient left message on vm requesting a refill for Oxycodone, he stated that he is still having some pain. Michael Ventresca,CMA

## 2013-07-23 NOTE — Telephone Encounter (Signed)
Very short course in box.  No further refills, if still having pain we need to consider the next step in knee OA treatment.

## 2013-07-24 NOTE — Telephone Encounter (Signed)
Patient has been notified. Rhonda Cunningham,CMA  

## 2013-07-29 ENCOUNTER — Encounter: Payer: Self-pay | Admitting: Physician Assistant

## 2013-07-29 ENCOUNTER — Ambulatory Visit (INDEPENDENT_AMBULATORY_CARE_PROVIDER_SITE_OTHER): Payer: Commercial Managed Care - HMO | Admitting: Physician Assistant

## 2013-07-29 VITALS — BP 129/73 | HR 104 | Wt 221.0 lb

## 2013-07-29 DIAGNOSIS — Z13220 Encounter for screening for lipoid disorders: Secondary | ICD-10-CM

## 2013-07-29 DIAGNOSIS — E559 Vitamin D deficiency, unspecified: Secondary | ICD-10-CM

## 2013-07-29 DIAGNOSIS — I10 Essential (primary) hypertension: Secondary | ICD-10-CM

## 2013-07-29 NOTE — Progress Notes (Signed)
   Subjective:    Patient ID: Raymond Hardin, male    DOB: 1940-06-07, 73 y.o.   MRN: [id number]  HPI The patient is a 73 year old male who presents to the clinic to followup on hypertension. He is seen dual-lead between the [facility] as well as our office. There are some labs they ordered that we do not order. He denies any chest pain, palpitations, headaches, vision changes. He is feeling much better today. He has been able to ride his motorcycle which is made him happy. He has more energy than usual. He has no complaints today. He also reports that his knees are feeling much better. Patient has just finished a series of Synvisc injections.   Review of Systems     Objective:   Physical Exam  Constitutional: He is oriented to person, place, and time. He appears well-developed and well-nourished.  HENT:  Head: Normocephalic and atraumatic.  Cardiovascular: Regular rhythm and normal heart sounds.   Tachycardia at 104.  Pulmonary/Chest: Effort normal and breath sounds normal. He has no wheezes.  Neurological: He is alert and oriented to person, place, and time.  Psychiatric: He has a normal mood and affect. His behavior is normal.          Assessment & Plan:  Hypertension-blood pressure is well-controlled on current dose of Vasotec. At this point patient does not need refills but ok for refills for the next 6 months. We'll check a CMP.  Depression-patient well controlled on Wellbutrin and Zoloft together. Patient does not need refills at this time. Klonopin to be used as needed for anxiety.  Discuss with patient having a fasting labs on patient for cholesterol he reports he thinks PA drops this. He is willing to have tried here. Sent him home with lab slip to come back fasting for at least 8 hours and get levels drawn.

## 2013-08-08 LAB — COMPLETE METABOLIC PANEL WITH GFR
ALBUMIN: 4.4 g/dL (ref 3.5–5.2)
ALK PHOS: 37 U/L — AB (ref 39–117)
ALT: 10 U/L (ref 0–53)
AST: 14 U/L (ref 0–37)
BUN: 15 mg/dL (ref 6–23)
CALCIUM: 9.3 mg/dL (ref 8.4–10.5)
CHLORIDE: 101 meq/L (ref 96–112)
CO2: 27 mEq/L (ref 19–32)
Creat: 1.18 mg/dL (ref 0.50–1.35)
GFR, Est African American: 72 mL/min
GFR, Est Non African American: 62 mL/min
Glucose, Bld: 110 mg/dL — ABNORMAL HIGH (ref 70–99)
POTASSIUM: 4.2 meq/L (ref 3.5–5.3)
SODIUM: 137 meq/L (ref 135–145)
TOTAL PROTEIN: 6.9 g/dL (ref 6.0–8.3)
Total Bilirubin: 0.4 mg/dL (ref 0.2–1.2)

## 2013-08-08 LAB — LIPID PANEL
CHOL/HDL RATIO: 4.8 ratio
Cholesterol: 206 mg/dL — ABNORMAL HIGH (ref 0–200)
HDL: 43 mg/dL (ref >39)
LDL Cholesterol: 113 mg/dL — ABNORMAL HIGH (ref 0–99)
Triglycerides: 252 mg/dL — ABNORMAL HIGH (ref <150)
VLDL: 50 mg/dL — AB (ref 0–40)

## 2013-08-09 LAB — VITAMIN D 25 HYDROXY (VIT D DEFICIENCY, FRACTURES): VIT D 25 HYDROXY: 14 ng/mL — AB (ref 30–89)

## 2013-08-13 ENCOUNTER — Other Ambulatory Visit: Payer: Self-pay | Admitting: Physician Assistant

## 2013-08-13 ENCOUNTER — Encounter: Payer: Self-pay | Admitting: Physician Assistant

## 2013-08-13 DIAGNOSIS — E559 Vitamin D deficiency, unspecified: Secondary | ICD-10-CM | POA: Insufficient documentation

## 2013-08-13 MED ORDER — VITAMIN D (ERGOCALCIFEROL) 1.25 MG (50000 UNIT) PO CAPS: 50000.0000 [IU] | ORAL_CAPSULE | ORAL | Status: DC

## 2013-08-14 ENCOUNTER — Telehealth: Payer: Self-pay | Admitting: *Deleted

## 2013-08-14 DIAGNOSIS — R7301 Impaired fasting glucose: Secondary | ICD-10-CM

## 2013-08-16 ENCOUNTER — Encounter: Payer: Self-pay | Admitting: Physician Assistant

## 2013-08-16 DIAGNOSIS — R7303 Prediabetes: Secondary | ICD-10-CM | POA: Insufficient documentation

## 2013-08-16 LAB — HEMOGLOBIN A1C
HEMOGLOBIN A1C: 6.1 % — AB (ref <5.7)
MEAN PLASMA GLUCOSE: 128 mg/dL — AB (ref <117)

## 2013-08-21 ENCOUNTER — Other Ambulatory Visit: Payer: Self-pay | Admitting: *Deleted

## 2013-08-21 MED ORDER — OXYCODONE HCL 5 MG PO TABS: 5.0000 mg | ORAL_TABLET | ORAL | Status: DC | PRN

## 2013-09-17 ENCOUNTER — Telehealth: Payer: Self-pay

## 2013-09-17 MED ORDER — OXYCODONE HCL 5 MG PO TABS: 5.0000 mg | ORAL_TABLET | ORAL | Status: DC | PRN

## 2013-09-17 NOTE — Telephone Encounter (Signed)
Patient request refill for Oxycodone. Rhonda Cunningham,CMA  

## 2013-09-17 NOTE — Telephone Encounter (Signed)
Small additional refill provided, we are not going to do any further refills on oxycodone, if he continues to have pain in his knees despite interventional/injection treatment, he needs to consider surgery.

## 2013-09-18 NOTE — Telephone Encounter (Signed)
Spoke to patient advised that Rx Oxycodone was ready for pick up and he will need surgical intervention if he continues to have pain. Rhonda Cunningham,CMA

## 2013-09-27 ENCOUNTER — Telehealth: Payer: Self-pay

## 2013-09-27 NOTE — Telephone Encounter (Signed)
Patient called and left a message on nurse line asking for a return call about samples  Returned Call: Left message asking patient to call back.

## 2013-10-17 ENCOUNTER — Other Ambulatory Visit: Payer: Self-pay | Admitting: *Deleted

## 2013-10-17 MED ORDER — OXYCODONE HCL 5 MG PO TABS: 5.0000 mg | ORAL_TABLET | ORAL | Status: DC | PRN

## 2013-11-14 ENCOUNTER — Other Ambulatory Visit: Payer: Self-pay | Admitting: *Deleted

## 2013-11-14 MED ORDER — OXYCODONE HCL 5 MG PO TABS: 5.0000 mg | ORAL_TABLET | ORAL | Status: DC | PRN

## 2013-12-16 ENCOUNTER — Other Ambulatory Visit: Payer: Self-pay | Admitting: *Deleted

## 2013-12-16 MED ORDER — OXYCODONE HCL 5 MG PO TABS: 5.0000 mg | ORAL_TABLET | ORAL | Status: DC | PRN

## 2014-01-16 ENCOUNTER — Other Ambulatory Visit: Payer: Self-pay | Admitting: *Deleted

## 2014-01-16 MED ORDER — OXYCODONE HCL 5 MG PO TABS: 5.0000 mg | ORAL_TABLET | ORAL | Status: DC | PRN

## 2014-01-29 ENCOUNTER — Encounter: Payer: Self-pay | Admitting: Physician Assistant

## 2014-01-29 ENCOUNTER — Ambulatory Visit (INDEPENDENT_AMBULATORY_CARE_PROVIDER_SITE_OTHER): Payer: Medicare HMO | Admitting: Physician Assistant

## 2014-01-29 VITALS — BP 138/78 | HR 104 | Ht 70.0 in | Wt 215.0 lb

## 2014-01-29 DIAGNOSIS — N521 Erectile dysfunction due to diseases classified elsewhere: Secondary | ICD-10-CM

## 2014-01-29 DIAGNOSIS — M1710 Unilateral primary osteoarthritis, unspecified knee: Secondary | ICD-10-CM

## 2014-01-29 DIAGNOSIS — R7303 Prediabetes: Secondary | ICD-10-CM

## 2014-01-29 DIAGNOSIS — I10 Essential (primary) hypertension: Secondary | ICD-10-CM

## 2014-01-29 DIAGNOSIS — R7309 Other abnormal glucose: Secondary | ICD-10-CM

## 2014-01-29 DIAGNOSIS — N529 Male erectile dysfunction, unspecified: Secondary | ICD-10-CM

## 2014-01-29 DIAGNOSIS — M170 Bilateral primary osteoarthritis of knee: Secondary | ICD-10-CM

## 2014-01-29 LAB — POCT GLYCOSYLATED HEMOGLOBIN (HGB A1C): HEMOGLOBIN A1C: 5.8

## 2014-01-29 MED ORDER — AVANAFIL 100 MG PO TABS: ORAL_TABLET | ORAL | Status: DC

## 2014-01-29 MED ORDER — ENALAPRIL MALEATE 20 MG PO TABS: 20.0000 mg | ORAL_TABLET | Freq: Two times a day (BID) | ORAL | Status: DC

## 2014-01-29 NOTE — Progress Notes (Signed)
   Subjective:    Patient ID: Raymond Hardin, male    DOB: 1941-02-20, 73 y.o.   MRN: [id number]  HPI Pt presents to the clinic for 6 month follow up on pre-diabets, HTN, ED.     patient was diagnosed with prediabetes approximately 6 months ago. He has made some lifestyle changes and comes to have A1c followed up with. Has lost 7 pounds since last visit. He has decreased a lot of bread in his diet. He started drinking more water. He is currently not on any medication.  Hypertension-patient is currently on enalapril 20 mg twice daily. He denies any chest pains, palpitations, headaches or vision changes.  erectile dysfunction-patient in the past that she is what ever has been the cheapest for his erectile dysfunction. He would like a prescription for anything today. We no longer carry samples therefore he needs a coupon card if we have one.  Osteoarthritis both knees-he was seen Dr. Darene Lamer. for this. Has not followed up in a while. He requested a refill of oxcodone at beginning of month it was refilled by nurses. [facility] gave him tramad[facility]. He did not collect the tramadol works. He minutes to receiving one injection of supratz by [facility] doctor but not following up. He is taking mobic daily which helps some.     Review of Systems  All other systems reviewed and are negative.      Objective:   Physical Exam  Constitutional: He is oriented to person, place, and time. He appears well-developed and well-nourished.  HENT:  Head: Normocephalic and atraumatic.  Cardiovascular: Normal rate, regular rhythm and normal heart sounds.   Pulmonary/Chest: Effort normal and breath sounds normal. He has no wheezes.  Neurological: He is alert and oriented to person, place, and time.  Skin: Skin is dry.  Psychiatric: He has a normal mood and affect. His behavior is normal.          Assessment & Plan:  Pre-diabetes-A1C was 5.8. Better than 6 months ago. Way to go. Will continue to monitor. Keep up diet  and exercise. Watch sugars and carbs.   Flu shot given without complication.   HtN- refilled vasotec for 6 months.   ED- will see if stendra is more affordable. Given coupon card with rx. Follow up as needed.   Primary osteoarthritis of both knees- needs appt with Dr. Darene Lamer. Discussed narcotics is not the way to go daily to control pain. Need to consider other options. You had seen Dr. Darene Lamer in past. Per pt received one injection of supartz from [place] [facility]. Recommended follow up. Per pt still has mobic continue daily. Discussed no more monthly refills of oxycodone. I would like for pt to control pain other ways. Pt given tramadol by another doctor and encouraged him to use.

## 2014-02-04 ENCOUNTER — Ambulatory Visit: Payer: Commercial Managed Care - HMO | Admitting: Sports Medicine

## 2014-02-05 ENCOUNTER — Ambulatory Visit (INDEPENDENT_AMBULATORY_CARE_PROVIDER_SITE_OTHER): Payer: Commercial Managed Care - HMO | Admitting: Sports Medicine

## 2014-02-05 ENCOUNTER — Encounter: Payer: Self-pay | Admitting: Sports Medicine

## 2014-02-05 VITALS — BP 159/80 | HR 70 | Ht 70.0 in | Wt 216.0 lb

## 2014-02-05 DIAGNOSIS — M170 Bilateral primary osteoarthritis of knee: Secondary | ICD-10-CM

## 2014-02-05 DIAGNOSIS — M1710 Unilateral primary osteoarthritis, unspecified knee: Secondary | ICD-10-CM

## 2014-02-05 NOTE — Assessment & Plan Note (Signed)
Finished Supartz series 6 months ago. We are going to repeat viscous supplementation this time with OrthoVisc. Aspiration and Steroid and OrthoVisc injections into both knees. Return in one week for injection #2 of 4 into both knees.

## 2014-02-05 NOTE — Progress Notes (Signed)
  Subjective:    CC: Followup  HPI: Bilateral knee osteoarthritis : finished Supartz series 6 months ago, desires repeat viscous supplementation. Pain is moderate, persistent, localized to the joint lines without mechanical symptoms.  Past medical history, Surgical history, Family history not pertinant except as noted below, Social history, Allergies, and medications have been entered into the medical record, reviewed, and no changes needed.   Review of Systems: No fevers, chills, night sweats, weight loss, chest pain, or shortness of breath.   Objective:    General: Well Developed, well nourished, and in no acute distress.  Neuro: Alert and oriented x3, extra-ocular muscles intact, sensation grossly intact.  HEENT: Normocephalic, atraumatic, pupils equal round reactive to light, neck supple, no masses, no lymphadenopathy, thyroid nonpalpable.  Skin: Warm and dry, no rashes. Cardiac: Regular rate and rhythm, no murmurs rubs or gallops, no lower extremity edema.  Respiratory: Clear to auscultation bilaterally. Not using accessory muscles, speaking in full sentences.  Procedure: Real-time Ultrasound Guided aspiration/Injection of left knee Device: GE Logiq E  Verbal informed consent obtained.  Time-out conducted.  Noted no overlying erythema, induration, or other signs of local infection.  Skin prepped in a sterile fashion.  Local anesthesia: Topical Ethyl chloride.  With sterile technique and under real time ultrasound guidance:  Aspirated 25 cc of straw-colored fluid, syringe switched and 2 cc kenalog 40, 4 cc lidocaine injected easily, syringe again switched and 30 mg/2 mL of OrthoVisc (sodium hyaluronate) in a prefilled syringe was injected easily into the knee through a 22-gauge needle. Completed without difficulty  Pain immediately resolved suggesting accurate placement of the medication.  Advised to call if fevers/chills, erythema, induration, drainage, or persistent bleeding.    Images permanently stored and available for review in the ultrasound unit.  Impression: Technically successful ultrasound guided injection.  Procedure: Real-time Ultrasound Guided aspiration/Injection of right knee Device: GE Logiq E  Verbal informed consent obtained.  Time-out conducted.  Noted no overlying erythema, induration, or other signs of local infection.  Skin prepped in a sterile fashion.  Local anesthesia: Topical Ethyl chloride.  With sterile technique and under real time ultrasound guidance:  Aspirated 15 cc of straw-colored fluid, syringe switched and 2 cc kenalog 40, 4 cc lidocaine injected easily, syringe again switched and 30 mg/2 mL of OrthoVisc (sodium hyaluronate) in a prefilled syringe was injected easily into the knee through a 22-gauge needle. Completed without difficulty  Pain immediately resolved suggesting accurate placement of the medication.  Advised to call if fevers/chills, erythema, induration, drainage, or persistent bleeding.  Images permanently stored and available for review in the ultrasound unit.  Impression: Technically successful ultrasound guided injection. Impression and Recommendations:

## 2014-02-12 ENCOUNTER — Encounter: Payer: Self-pay | Admitting: Sports Medicine

## 2014-02-12 ENCOUNTER — Ambulatory Visit (INDEPENDENT_AMBULATORY_CARE_PROVIDER_SITE_OTHER): Payer: Commercial Managed Care - HMO | Admitting: Sports Medicine

## 2014-02-12 VITALS — BP 171/87 | HR 127 | Ht 70.0 in | Wt 215.0 lb

## 2014-02-12 DIAGNOSIS — M170 Bilateral primary osteoarthritis of knee: Secondary | ICD-10-CM

## 2014-02-12 NOTE — Assessment & Plan Note (Addendum)
Doing well, orthovisc injection number 2 of 4 into both knees. Return in one week for #3. Takes an occasional Mobic and tramadol At his next OrthoVisc injection, we will discuss a down taper regimen to stop his oxycodone.

## 2014-02-12 NOTE — Progress Notes (Signed)
  Procedure: Real-time Ultrasound Guided Injection of left knee Device: GE Logiq E  Verbal informed consent obtained.  Time-out conducted.  Noted no overlying erythema, induration, or other signs of local infection.  Skin prepped in a sterile fashion.  Local anesthesia: Topical Ethyl chloride.  With sterile technique and under real time ultrasound guidance:  Aspirated 20 cc of straw-colored fluid, syringe switched and 30 mg/2 mL of OrthoVisc (sodium hyaluronate) in a prefilled syringe was injected easily into the knee through a 22-gauge needle. Completed without difficulty  Pain immediately resolved suggesting accurate placement of the medication.  Advised to call if fevers/chills, erythema, induration, drainage, or persistent bleeding.  Images permanently stored and available for review in the ultrasound unit.  Impression: Technically successful ultrasound guided   Procedure: Real-time Ultrasound Guided Injection of right knee Device: GE Logiq E  Verbal informed consent obtained.  Time-out conducted.  Noted no overlying erythema, induration, or other signs of local infection.  Skin prepped in a sterile fashion.  Local anesthesia: Topical Ethyl chloride.  With sterile technique and under real time ultrasound guidance:  Aspirated 5 cc of straw-colored fluid, syringe switched and 30 mg/2 mL of OrthoVisc (sodium hyaluronate) in a prefilled syringe was injected easily into the knee through a 22-gauge needle. Completed without difficulty  Pain immediately resolved suggesting accurate placement of the medication.  Advised to call if fevers/chills, erythema, induration, drainage, or persistent bleeding.  Images permanently stored and available for review in the ultrasound unit.  Impression: Technically successful ultrasound guided injection.

## 2014-02-14 ENCOUNTER — Other Ambulatory Visit: Payer: Self-pay | Admitting: *Deleted

## 2014-02-14 ENCOUNTER — Telehealth: Payer: Self-pay | Admitting: *Deleted

## 2014-02-14 NOTE — Telephone Encounter (Signed)
He will be seeing me soon for his next OrthoVisc injection, we will discuss a down taper regimen to stop.

## 2014-02-14 NOTE — Telephone Encounter (Signed)
Pt left vm asking for a refill on his oxycodone dated for 10/10.  Looking back through the notes it looks like you and Luvenia Starch are trying to get him to stop taking narcotics daily for pain management.  Please advise.

## 2014-02-17 MED ORDER — OXYCODONE HCL 5 MG PO TABS: 2.5000 mg | ORAL_TABLET | ORAL | Status: DC | PRN

## 2014-02-17 NOTE — Telephone Encounter (Signed)
I am going to start a scheduled down taper of his oxycodone. This is not the correct answer for knee osteoarthritis or even back pain for that matter. We will do one half tab daily for a month then stop.

## 2014-02-17 NOTE — Telephone Encounter (Signed)
So denying the refill for now??

## 2014-02-19 ENCOUNTER — Ambulatory Visit (INDEPENDENT_AMBULATORY_CARE_PROVIDER_SITE_OTHER): Payer: Commercial Managed Care - HMO | Admitting: Sports Medicine

## 2014-02-19 ENCOUNTER — Encounter: Payer: Self-pay | Admitting: Sports Medicine

## 2014-02-19 VITALS — BP 153/89 | HR 109 | Ht 70.5 in | Wt 220.0 lb

## 2014-02-19 DIAGNOSIS — M170 Bilateral primary osteoarthritis of knee: Secondary | ICD-10-CM

## 2014-02-19 NOTE — Assessment & Plan Note (Signed)
OrthoVisc injection #3 of 4. Decreasing oxycodone to one half tab daily. Return in one week for #4.

## 2014-02-19 NOTE — Progress Notes (Signed)
  Procedure: Real-time Ultrasound Guided Injection of left knee Device: GE Logiq E  Verbal informed consent obtained.  Time-out conducted.  Noted no overlying erythema, induration, or other signs of local infection.  Skin prepped in a sterile fashion.  Local anesthesia: Topical Ethyl chloride.  With sterile technique and under real time ultrasound guidance:  Aspirated 20 cc of straw-colored fluid, syringe switched and 30 mg/2 mL of OrthoVisc (sodium hyaluronate) in a prefilled syringe was injected easily into the knee through a 22-gauge needle. Completed without difficulty  Pain immediately resolved suggesting accurate placement of the medication.  Advised to call if fevers/chills, erythema, induration, drainage, or persistent bleeding.  Images permanently stored and available for review in the ultrasound unit.  Impression: Technically successful ultrasound guided   Procedure: Real-time Ultrasound Guided Injection of right knee Device: GE Logiq E  Verbal informed consent obtained.  Time-out conducted.  Noted no overlying erythema, induration, or other signs of local infection.  Skin prepped in a sterile fashion.  Local anesthesia: Topical Ethyl chloride.  With sterile technique and under real time ultrasound guidance:  Aspirated 10 cc of straw-colored fluid, syringe switched and 30 mg/2 mL of OrthoVisc (sodium hyaluronate) in a prefilled syringe was injected easily into the knee through a 22-gauge needle. Completed without difficulty  Pain immediately resolved suggesting accurate placement of the medication.  Advised to call if fevers/chills, erythema, induration, drainage, or persistent bleeding.  Images permanently stored and available for review in the ultrasound unit.  Impression: Technically successful ultrasound guided injection.

## 2014-02-26 ENCOUNTER — Ambulatory Visit (INDEPENDENT_AMBULATORY_CARE_PROVIDER_SITE_OTHER): Payer: Commercial Managed Care - HMO | Admitting: Sports Medicine

## 2014-02-26 ENCOUNTER — Encounter: Payer: Self-pay | Admitting: Sports Medicine

## 2014-02-26 VITALS — BP 146/76 | HR 99 | Wt 216.0 lb

## 2014-02-26 DIAGNOSIS — M170 Bilateral primary osteoarthritis of knee: Secondary | ICD-10-CM

## 2014-02-26 NOTE — Assessment & Plan Note (Signed)
OrthoVisc injection #4 of 4 into both knees. Doing well. Return in one month, if continues to have pain at that point we need to discuss surgical intervention versus autologous mesenchymal stem cell injection.

## 2014-02-26 NOTE — Progress Notes (Signed)
  Procedure: Real-time Ultrasound Guided Injection of left knee Device: GE Logiq E  Verbal informed consent obtained.  Time-out conducted.  Noted no overlying erythema, induration, or other signs of local infection.  Skin prepped in a sterile fashion.  Local anesthesia: Topical Ethyl chloride.  With sterile technique and under real time ultrasound guidance:   30 mg/2 mL of OrthoVisc (sodium hyaluronate) in a prefilled syringe was injected easily into the knee through a 22-gauge needle. Completed without difficulty  Pain immediately resolved suggesting accurate placement of the medication.  Advised to call if fevers/chills, erythema, induration, drainage, or persistent bleeding.  Images permanently stored and available for review in the ultrasound unit.  Impression: Technically successful ultrasound guided   Procedure: Real-time Ultrasound Guided Injection of right knee Device: GE Logiq E  Verbal informed consent obtained.  Time-out conducted.  Noted no overlying erythema, induration, or other signs of local infection.  Skin prepped in a sterile fashion.  Local anesthesia: Topical Ethyl chloride.  With sterile technique and under real time ultrasound guidance:  Aspirated 25 cc of straw-colored fluid, syringe switched and 30 mg/2 mL of OrthoVisc (sodium hyaluronate) in a prefilled syringe was injected easily into the knee through a 22-gauge needle. Completed without difficulty  Pain immediately resolved suggesting accurate placement of the medication.  Advised to call if fevers/chills, erythema, induration, drainage, or persistent bleeding.  Images permanently stored and available for review in the ultrasound unit.  Impression: Technically successful ultrasound guided injection.

## 2014-03-21 ENCOUNTER — Telehealth: Payer: Self-pay | Admitting: *Deleted

## 2014-03-21 MED ORDER — HYDROCODONE-ACETAMINOPHEN 5-325 MG PO TABS: ORAL_TABLET | ORAL | Status: DC

## 2014-03-21 NOTE — Telephone Encounter (Signed)
This is the classic response to a patient who has become tolerant to oxycodone, I will further down taper to hydrocodone one half tab daily.

## 2014-03-21 NOTE — Telephone Encounter (Signed)
Pt left vm asking for "one more refill" on his oxycodone.  He stated that the half tabs don't work quite as well as a whole tab and has sometimes had to take 2 (1 whole).  What are your thoughts on a refill? I know you wanted him to come off of this med completely. Please advise.

## 2014-03-21 NOTE — Telephone Encounter (Signed)
Pt.notified

## 2014-03-31 ENCOUNTER — Ambulatory Visit (INDEPENDENT_AMBULATORY_CARE_PROVIDER_SITE_OTHER): Payer: Commercial Managed Care - HMO | Admitting: Sports Medicine

## 2014-03-31 ENCOUNTER — Encounter: Payer: Self-pay | Admitting: Sports Medicine

## 2014-03-31 VITALS — BP 150/94 | HR 91 | Wt 217.0 lb

## 2014-03-31 DIAGNOSIS — M170 Bilateral primary osteoarthritis of knee: Secondary | ICD-10-CM

## 2014-03-31 NOTE — Addendum Note (Signed)
Addended by: Narda Rutherford on: 03/31/2014 11:29 AM   Modules accepted: Medications

## 2014-03-31 NOTE — Assessment & Plan Note (Addendum)
Aspiration from the left knee. He has already had viscous supplementation with Orthovisc and multiple steroid injections. He was taking oxycodone every day, we are weaning him down off of this, he is only doing one half a tab of hydrocodone 5/325 now. Return as needed. Again discussed knee replacement.

## 2014-03-31 NOTE — Progress Notes (Signed)
  Subjective:    CC: Follow-up  HPI: Knee osteoarthritis: Overall doing well, takes one half of a hydrocodone 5/325, we have been down tapering this from oxycodone. Finished viscosupplementation and steroid injections into both knees recently. He does have some left knee swelling, and is amenable now to consider knee replacement. Symptoms are moderate, persistent.  Past medical history, Surgical history, Family history not pertinant except as noted below, Social history, Allergies, and medications have been entered into the medical record, reviewed, and no changes needed.   Review of Systems: No fevers, chills, night sweats, weight loss, chest pain, or shortness of breath.   Objective:    General: Well Developed, well nourished, and in no acute distress.  Neuro: Alert and oriented x3, extra-ocular muscles intact, sensation grossly intact.  HEENT: Normocephalic, atraumatic, pupils equal round reactive to light, neck supple, no masses, no lymphadenopathy, thyroid nonpalpable.  Skin: Warm and dry, no rashes. Cardiac: Regular rate and rhythm, no murmurs rubs or gallops, no lower extremity edema.  Respiratory: Clear to auscultation bilaterally. Not using accessory muscles, speaking in full sentences.  Procedure: Real-time Ultrasound Guided aspiration of left knee Device: GE Logiq E  Verbal informed consent obtained.  Time-out conducted.  Noted no overlying erythema, induration, or other signs of local infection.  Skin prepped in a sterile fashion.  Local anesthesia: Topical Ethyl chloride.  With sterile technique and under real time ultrasound guidance:  21 mL of straw-colored fluid aspirated. Completed without difficulty  Pain immediately resolved suggesting accurate placement of the medication.  Advised to call if fevers/chills, erythema, induration, drainage, or persistent bleeding.  Images permanently stored and available for review in the ultrasound unit.  Impression: Technically  successful ultrasound guided injection.  Impression and Recommendations:

## 2014-04-08 ENCOUNTER — Ambulatory Visit: Payer: Commercial Managed Care - HMO | Admitting: Physician Assistant

## 2014-05-21 DIAGNOSIS — G4730 Sleep apnea, unspecified: Secondary | ICD-10-CM | POA: Diagnosis not present

## 2014-05-21 DIAGNOSIS — Z01812 Encounter for preprocedural laboratory examination: Secondary | ICD-10-CM | POA: Diagnosis not present

## 2014-05-21 DIAGNOSIS — E785 Hyperlipidemia, unspecified: Secondary | ICD-10-CM | POA: Diagnosis not present

## 2014-05-21 DIAGNOSIS — I10 Essential (primary) hypertension: Secondary | ICD-10-CM | POA: Diagnosis not present

## 2014-05-21 DIAGNOSIS — Z6831 Body mass index (BMI) 31.0-31.9, adult: Secondary | ICD-10-CM | POA: Diagnosis not present

## 2014-05-21 DIAGNOSIS — M1990 Unspecified osteoarthritis, unspecified site: Secondary | ICD-10-CM | POA: Diagnosis not present

## 2014-05-21 DIAGNOSIS — F17210 Nicotine dependence, cigarettes, uncomplicated: Secondary | ICD-10-CM | POA: Diagnosis not present

## 2014-05-21 DIAGNOSIS — Z049 Encounter for examination and observation for unspecified reason: Secondary | ICD-10-CM | POA: Diagnosis not present

## 2014-05-21 DIAGNOSIS — F329 Major depressive disorder, single episode, unspecified: Secondary | ICD-10-CM | POA: Diagnosis not present

## 2014-05-21 DIAGNOSIS — E669 Obesity, unspecified: Secondary | ICD-10-CM | POA: Diagnosis not present

## 2014-05-23 ENCOUNTER — Ambulatory Visit (INDEPENDENT_AMBULATORY_CARE_PROVIDER_SITE_OTHER): Payer: Commercial Managed Care - HMO | Admitting: Physician Assistant

## 2014-05-23 ENCOUNTER — Encounter: Payer: Self-pay | Admitting: Physician Assistant

## 2014-05-23 VITALS — BP 141/71 | HR 103 | Ht 70.5 in | Wt 219.0 lb

## 2014-05-23 DIAGNOSIS — Z01818 Encounter for other preprocedural examination: Secondary | ICD-10-CM

## 2014-05-23 NOTE — Progress Notes (Signed)
   Subjective:    Patient ID: Raymond Hardin, male    DOB: 20-Apr-1941, 74 y.o.   MRN: [id number]  HPI Pt is s 74 yo male who presents to the clinic for surgical clearance for 05/27/14 total knee replacement. No problems or concerns today.    Review of Systems  All other systems reviewed and are negative.      Objective:   Physical Exam  Constitutional: He is oriented to person, place, and time. He appears well-developed and well-nourished.  HENT:  Head: Normocephalic and atraumatic.  Right Ear: External ear normal.  Left Ear: External ear normal.  Nose: Nose normal.  Mouth/Throat: Oropharynx is clear and moist.  Eyes: Conjunctivae are normal.  Neck: Normal range of motion. Neck supple.  Cardiovascular: Normal rate, regular rhythm and normal heart sounds.   Pulmonary/Chest: Effort normal and breath sounds normal. He has no wheezes.  Lymphadenopathy:    He has no cervical adenopathy.  Neurological: He is alert and oriented to person, place, and time.  Skin: Skin is warm and dry.  Psychiatric: He has a normal mood and affect. His behavior is normal.          Assessment & Plan:  Surgical clearance- EKG done 05/21/14 NSR with no acute changes. CBC looked great. Pt stopped mobic last week. CXR unremarkable. Kidney and liver function great. BP up a little but controlled for age.  Pt has no compliants today and released for surgery.

## 2014-05-27 DIAGNOSIS — M179 Osteoarthritis of knee, unspecified: Secondary | ICD-10-CM | POA: Diagnosis not present

## 2014-05-27 DIAGNOSIS — Z471 Aftercare following joint replacement surgery: Secondary | ICD-10-CM | POA: Diagnosis not present

## 2014-05-27 DIAGNOSIS — M1712 Unilateral primary osteoarthritis, left knee: Secondary | ICD-10-CM | POA: Diagnosis not present

## 2014-05-27 DIAGNOSIS — Z96652 Presence of left artificial knee joint: Secondary | ICD-10-CM | POA: Diagnosis not present

## 2014-06-02 DIAGNOSIS — R2689 Other abnormalities of gait and mobility: Secondary | ICD-10-CM | POA: Diagnosis not present

## 2014-06-02 DIAGNOSIS — I10 Essential (primary) hypertension: Secondary | ICD-10-CM | POA: Diagnosis not present

## 2014-06-02 DIAGNOSIS — M25562 Pain in left knee: Secondary | ICD-10-CM | POA: Diagnosis not present

## 2014-06-02 DIAGNOSIS — R489 Unspecified symbolic dysfunctions: Secondary | ICD-10-CM | POA: Diagnosis not present

## 2014-06-02 DIAGNOSIS — M1990 Unspecified osteoarthritis, unspecified site: Secondary | ICD-10-CM | POA: Diagnosis not present

## 2014-06-02 DIAGNOSIS — M6281 Muscle weakness (generalized): Secondary | ICD-10-CM | POA: Diagnosis not present

## 2014-06-02 DIAGNOSIS — F329 Major depressive disorder, single episode, unspecified: Secondary | ICD-10-CM | POA: Diagnosis not present

## 2014-06-02 DIAGNOSIS — E780 Pure hypercholesterolemia: Secondary | ICD-10-CM | POA: Diagnosis not present

## 2014-06-02 DIAGNOSIS — R41841 Cognitive communication deficit: Secondary | ICD-10-CM | POA: Diagnosis not present

## 2014-06-02 DIAGNOSIS — M1712 Unilateral primary osteoarthritis, left knee: Secondary | ICD-10-CM | POA: Diagnosis not present

## 2014-06-09 DIAGNOSIS — I10 Essential (primary) hypertension: Secondary | ICD-10-CM | POA: Diagnosis not present

## 2014-06-09 DIAGNOSIS — F329 Major depressive disorder, single episode, unspecified: Secondary | ICD-10-CM | POA: Diagnosis not present

## 2014-06-09 DIAGNOSIS — M1990 Unspecified osteoarthritis, unspecified site: Secondary | ICD-10-CM | POA: Diagnosis not present

## 2014-06-09 DIAGNOSIS — M1712 Unilateral primary osteoarthritis, left knee: Secondary | ICD-10-CM | POA: Diagnosis not present

## 2014-06-09 DIAGNOSIS — E780 Pure hypercholesterolemia: Secondary | ICD-10-CM | POA: Diagnosis not present

## 2014-06-11 DIAGNOSIS — M25562 Pain in left knee: Secondary | ICD-10-CM | POA: Diagnosis not present

## 2014-06-18 DIAGNOSIS — Z471 Aftercare following joint replacement surgery: Secondary | ICD-10-CM | POA: Diagnosis not present

## 2014-06-18 DIAGNOSIS — R2689 Other abnormalities of gait and mobility: Secondary | ICD-10-CM | POA: Diagnosis not present

## 2014-06-18 DIAGNOSIS — Z96652 Presence of left artificial knee joint: Secondary | ICD-10-CM | POA: Diagnosis not present

## 2014-06-18 DIAGNOSIS — F329 Major depressive disorder, single episode, unspecified: Secondary | ICD-10-CM | POA: Diagnosis not present

## 2014-06-18 DIAGNOSIS — I10 Essential (primary) hypertension: Secondary | ICD-10-CM | POA: Diagnosis not present

## 2014-06-20 DIAGNOSIS — Z471 Aftercare following joint replacement surgery: Secondary | ICD-10-CM | POA: Diagnosis not present

## 2014-06-20 DIAGNOSIS — F329 Major depressive disorder, single episode, unspecified: Secondary | ICD-10-CM | POA: Diagnosis not present

## 2014-06-20 DIAGNOSIS — R2689 Other abnormalities of gait and mobility: Secondary | ICD-10-CM | POA: Diagnosis not present

## 2014-06-20 DIAGNOSIS — I10 Essential (primary) hypertension: Secondary | ICD-10-CM | POA: Diagnosis not present

## 2014-06-20 DIAGNOSIS — Z96652 Presence of left artificial knee joint: Secondary | ICD-10-CM | POA: Diagnosis not present

## 2014-06-24 DIAGNOSIS — R2689 Other abnormalities of gait and mobility: Secondary | ICD-10-CM | POA: Diagnosis not present

## 2014-06-24 DIAGNOSIS — Z471 Aftercare following joint replacement surgery: Secondary | ICD-10-CM | POA: Diagnosis not present

## 2014-06-24 DIAGNOSIS — I10 Essential (primary) hypertension: Secondary | ICD-10-CM | POA: Diagnosis not present

## 2014-06-24 DIAGNOSIS — F329 Major depressive disorder, single episode, unspecified: Secondary | ICD-10-CM | POA: Diagnosis not present

## 2014-06-24 DIAGNOSIS — Z96652 Presence of left artificial knee joint: Secondary | ICD-10-CM | POA: Diagnosis not present

## 2014-06-26 DIAGNOSIS — F329 Major depressive disorder, single episode, unspecified: Secondary | ICD-10-CM | POA: Diagnosis not present

## 2014-06-26 DIAGNOSIS — R2689 Other abnormalities of gait and mobility: Secondary | ICD-10-CM | POA: Diagnosis not present

## 2014-06-26 DIAGNOSIS — Z471 Aftercare following joint replacement surgery: Secondary | ICD-10-CM | POA: Diagnosis not present

## 2014-06-26 DIAGNOSIS — Z96652 Presence of left artificial knee joint: Secondary | ICD-10-CM | POA: Diagnosis not present

## 2014-06-26 DIAGNOSIS — I10 Essential (primary) hypertension: Secondary | ICD-10-CM | POA: Diagnosis not present

## 2014-07-01 DIAGNOSIS — F329 Major depressive disorder, single episode, unspecified: Secondary | ICD-10-CM | POA: Diagnosis not present

## 2014-07-01 DIAGNOSIS — Z96652 Presence of left artificial knee joint: Secondary | ICD-10-CM | POA: Diagnosis not present

## 2014-07-01 DIAGNOSIS — R2689 Other abnormalities of gait and mobility: Secondary | ICD-10-CM | POA: Diagnosis not present

## 2014-07-01 DIAGNOSIS — Z471 Aftercare following joint replacement surgery: Secondary | ICD-10-CM | POA: Diagnosis not present

## 2014-07-01 DIAGNOSIS — I10 Essential (primary) hypertension: Secondary | ICD-10-CM | POA: Diagnosis not present

## 2014-07-03 DIAGNOSIS — Z96652 Presence of left artificial knee joint: Secondary | ICD-10-CM | POA: Diagnosis not present

## 2014-07-03 DIAGNOSIS — F329 Major depressive disorder, single episode, unspecified: Secondary | ICD-10-CM | POA: Diagnosis not present

## 2014-07-03 DIAGNOSIS — R2689 Other abnormalities of gait and mobility: Secondary | ICD-10-CM | POA: Diagnosis not present

## 2014-07-03 DIAGNOSIS — I10 Essential (primary) hypertension: Secondary | ICD-10-CM | POA: Diagnosis not present

## 2014-07-03 DIAGNOSIS — Z471 Aftercare following joint replacement surgery: Secondary | ICD-10-CM | POA: Diagnosis not present

## 2014-07-07 DIAGNOSIS — I10 Essential (primary) hypertension: Secondary | ICD-10-CM | POA: Diagnosis not present

## 2014-07-07 DIAGNOSIS — F329 Major depressive disorder, single episode, unspecified: Secondary | ICD-10-CM | POA: Diagnosis not present

## 2014-07-07 DIAGNOSIS — R2689 Other abnormalities of gait and mobility: Secondary | ICD-10-CM | POA: Diagnosis not present

## 2014-07-07 DIAGNOSIS — Z96652 Presence of left artificial knee joint: Secondary | ICD-10-CM | POA: Diagnosis not present

## 2014-07-07 DIAGNOSIS — Z471 Aftercare following joint replacement surgery: Secondary | ICD-10-CM | POA: Diagnosis not present

## 2014-07-10 DIAGNOSIS — Z471 Aftercare following joint replacement surgery: Secondary | ICD-10-CM | POA: Diagnosis not present

## 2014-07-10 DIAGNOSIS — F329 Major depressive disorder, single episode, unspecified: Secondary | ICD-10-CM | POA: Diagnosis not present

## 2014-07-10 DIAGNOSIS — I10 Essential (primary) hypertension: Secondary | ICD-10-CM | POA: Diagnosis not present

## 2014-07-10 DIAGNOSIS — R2689 Other abnormalities of gait and mobility: Secondary | ICD-10-CM | POA: Diagnosis not present

## 2014-07-10 DIAGNOSIS — Z96652 Presence of left artificial knee joint: Secondary | ICD-10-CM | POA: Diagnosis not present

## 2014-07-15 DIAGNOSIS — Z96652 Presence of left artificial knee joint: Secondary | ICD-10-CM | POA: Diagnosis not present

## 2014-07-15 DIAGNOSIS — R2689 Other abnormalities of gait and mobility: Secondary | ICD-10-CM | POA: Diagnosis not present

## 2014-07-15 DIAGNOSIS — I10 Essential (primary) hypertension: Secondary | ICD-10-CM | POA: Diagnosis not present

## 2014-07-15 DIAGNOSIS — F329 Major depressive disorder, single episode, unspecified: Secondary | ICD-10-CM | POA: Diagnosis not present

## 2014-07-15 DIAGNOSIS — Z471 Aftercare following joint replacement surgery: Secondary | ICD-10-CM | POA: Diagnosis not present

## 2014-07-30 ENCOUNTER — Ambulatory Visit: Payer: Commercial Managed Care - HMO | Admitting: Physician Assistant

## 2014-08-13 ENCOUNTER — Ambulatory Visit (INDEPENDENT_AMBULATORY_CARE_PROVIDER_SITE_OTHER): Payer: Commercial Managed Care - HMO | Admitting: Physician Assistant

## 2014-08-13 ENCOUNTER — Encounter: Payer: Self-pay | Admitting: Physician Assistant

## 2014-08-13 VITALS — BP 138/82 | HR 95 | Wt 217.0 lb

## 2014-08-13 DIAGNOSIS — N529 Male erectile dysfunction, unspecified: Secondary | ICD-10-CM

## 2014-08-13 DIAGNOSIS — I10 Essential (primary) hypertension: Secondary | ICD-10-CM | POA: Diagnosis not present

## 2014-08-13 DIAGNOSIS — Z96652 Presence of left artificial knee joint: Secondary | ICD-10-CM | POA: Diagnosis not present

## 2014-08-13 DIAGNOSIS — F411 Generalized anxiety disorder: Secondary | ICD-10-CM

## 2014-08-13 DIAGNOSIS — Z23 Encounter for immunization: Secondary | ICD-10-CM | POA: Diagnosis not present

## 2014-08-13 MED ORDER — ENALAPRIL MALEATE 20 MG PO TABS: 20.0000 mg | ORAL_TABLET | Freq: Two times a day (BID) | ORAL | Status: DC

## 2014-08-13 MED ORDER — SERTRALINE HCL 100 MG PO TABS: ORAL_TABLET | ORAL | Status: DC

## 2014-08-13 MED ORDER — SILDENAFIL CITRATE 100 MG PO TABS: 100.0000 mg | ORAL_TABLET | ORAL | Status: DC | PRN

## 2014-08-13 MED ORDER — ALPRAZOLAM 0.5 MG PO TABS: 0.5000 mg | ORAL_TABLET | Freq: Two times a day (BID) | ORAL | Status: DC | PRN

## 2014-08-13 NOTE — Progress Notes (Signed)
   Subjective:    Patient ID: Raymond Hardin, male    DOB: 01/10/1941, 74 y.o.   MRN: [id number]  HPI Pt is a 74 yo male who presents to the clinic for follow up on medications and get refills.   Depression/anxiety- pt does not feel depressed but feels more anxious. Most of his anxiety is focused around his sister and them arguing. Denies any sucidal or homicidal thoughts. On zoloft and klonapin but tried his friends xanax and seemed to work better. He is also s/p left knee replacement and spending more time at home. Doing well and done with therapy. Continues to walk with cane.   ED- would like to try something affordable. We have been giving samples but we are not carrying samples any more.    Review of Systems  All other systems reviewed and are negative.      Objective:   Physical Exam  Constitutional: He is oriented to person, place, and time. He appears well-developed and well-nourished.  HENT:  Head: Normocephalic and atraumatic.  Cardiovascular: Normal rate, regular rhythm and normal heart sounds.   Pulmonary/Chest: Effort normal and breath sounds normal. He has no wheezes.  Neurological: He is alert and oriented to person, place, and time.  Skin: Skin is dry.  Psychiatric: He has a normal mood and affect. His behavior is normal.          Assessment & Plan:  Depression/anxiety- PHQ-9 was 4. GAD-7 was 12. Discussed ways to avoid confrontation. Continue zoloft refilled for 6 months. Stop klonapin. Start xanax. Discussed abuse potential and to only use as needed. Could even cut in half and do twice a day. GET out of house, discussed deep breathing. Follow up in 3-6 months.   Prevnar given today.   S/p knee replacement- doing great. Still following up with orthopedic.   HTN- rechecked today in office and good. Refilled vasotec for 6 months.   ED- generic viagra sent to pharmacy to see if affordable.

## 2014-08-14 ENCOUNTER — Other Ambulatory Visit: Payer: Self-pay | Admitting: *Deleted

## 2014-10-14 ENCOUNTER — Other Ambulatory Visit: Payer: Self-pay | Admitting: Physician Assistant

## 2014-10-24 ENCOUNTER — Other Ambulatory Visit: Payer: Self-pay | Admitting: Physician Assistant

## 2014-10-31 ENCOUNTER — Other Ambulatory Visit: Payer: Self-pay | Admitting: Physician Assistant

## 2014-12-03 DIAGNOSIS — Z0100 Encounter for examination of eyes and vision without abnormal findings: Secondary | ICD-10-CM | POA: Diagnosis not present

## 2014-12-17 ENCOUNTER — Other Ambulatory Visit: Payer: Self-pay | Admitting: Physician Assistant

## 2014-12-22 IMAGING — CR DG KNEE COMPLETE 4+V*L*
2 series · 2 of 2 positions shown · non-contrast
Comparison: None.

CLINICAL DATA: Osteoarthritis both knees

EXAM:
LEFT KNEE - COMPLETE 4+ VIEW

[view not recorded (1 of 2)]
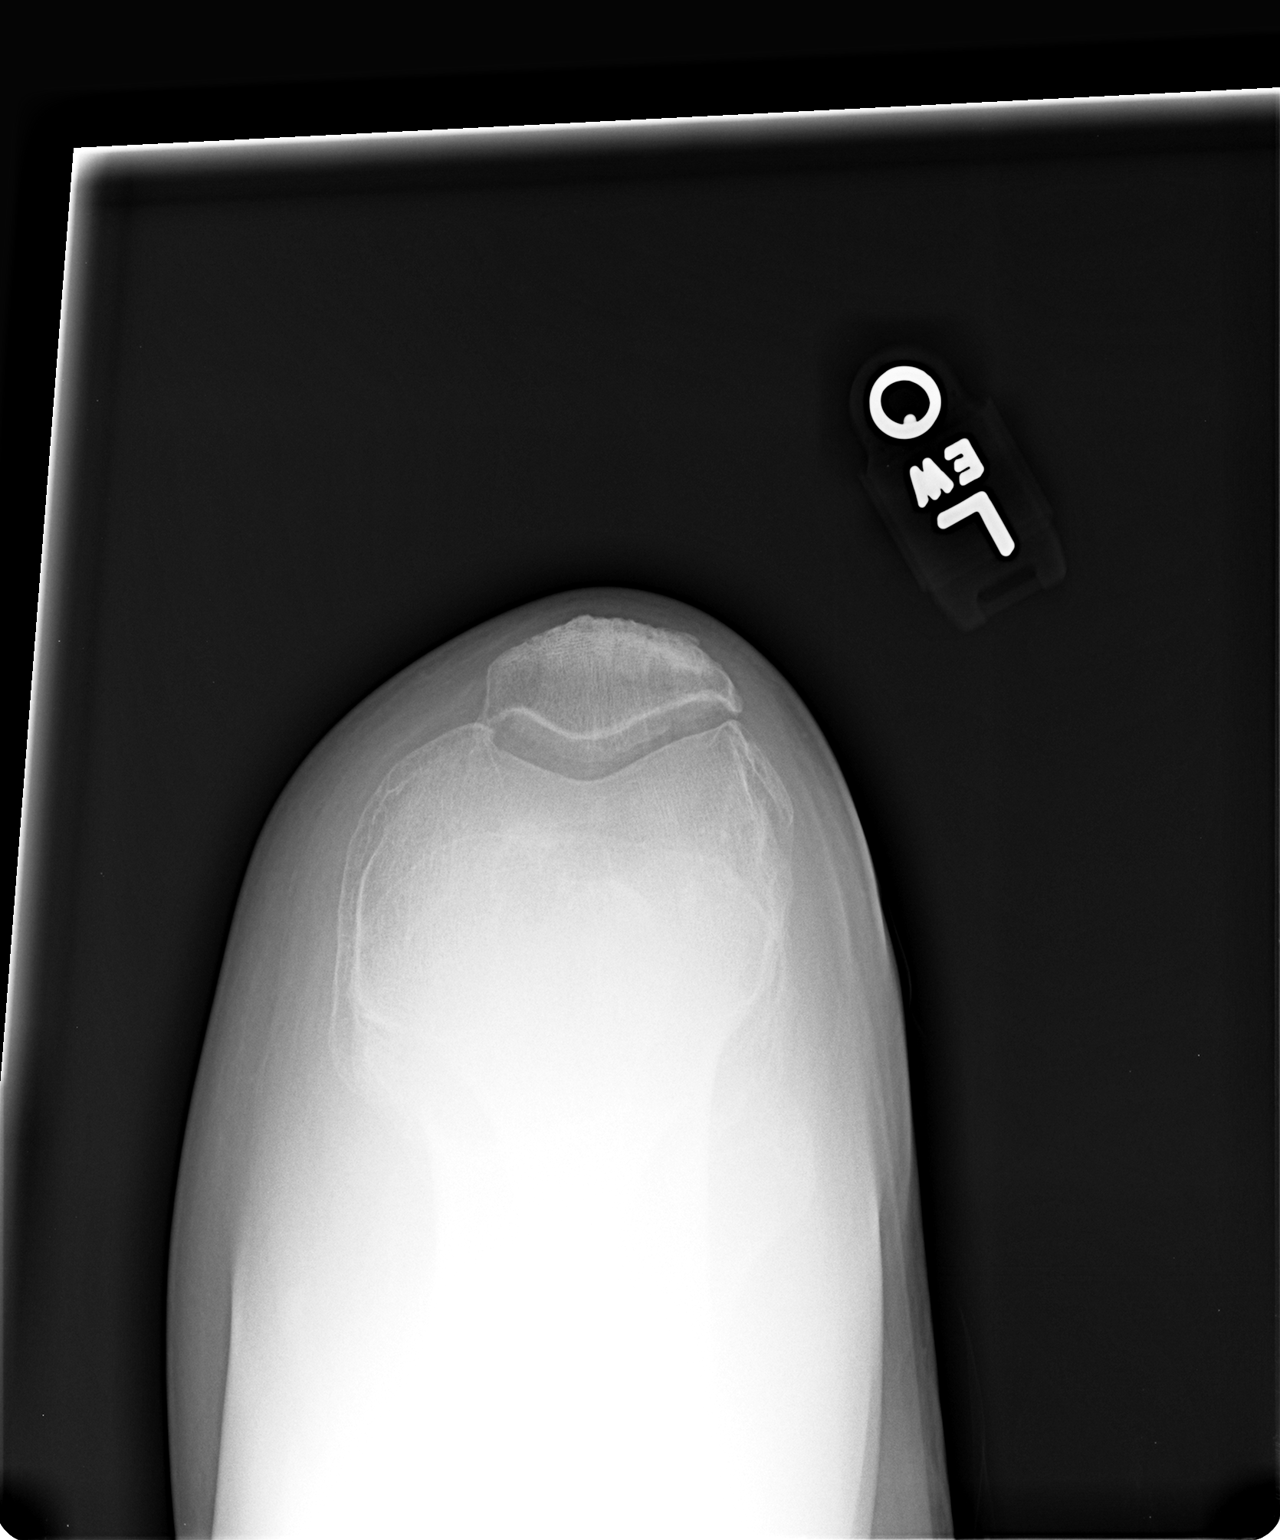

[view not recorded (2 of 2)]
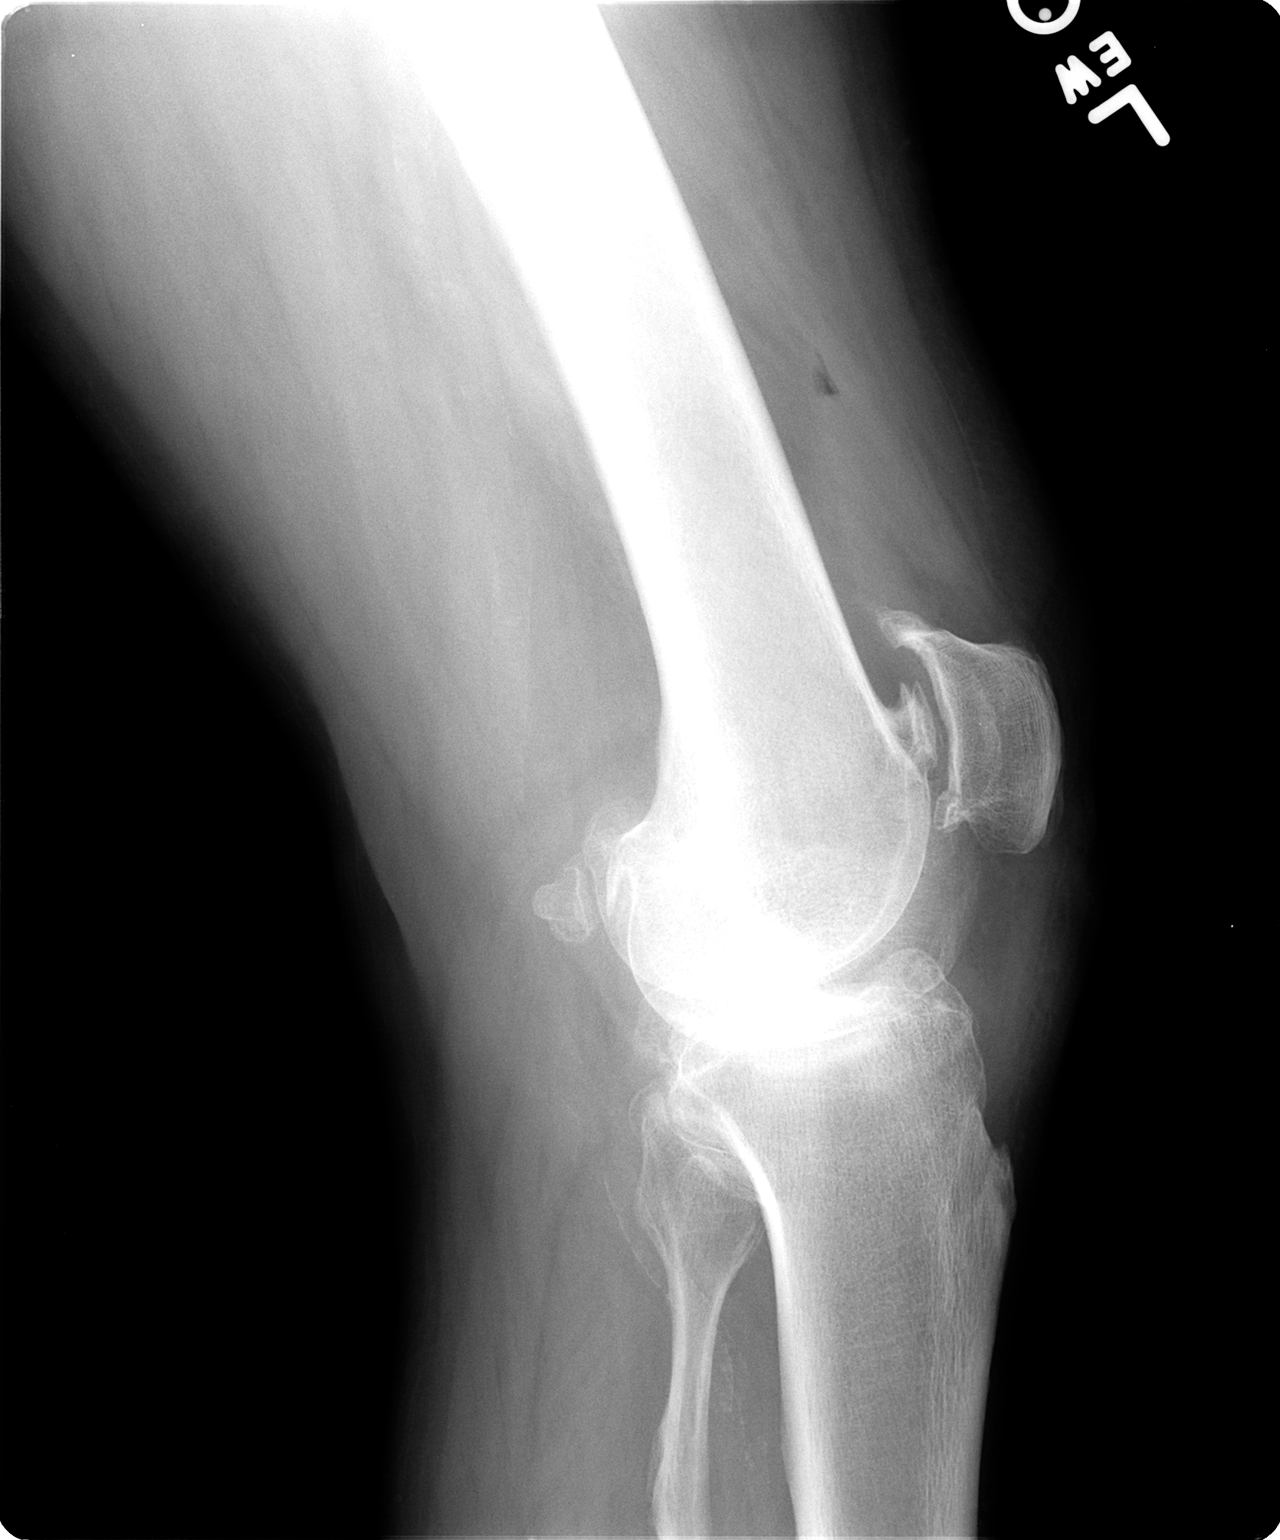

[2 of 2 positions shown; findings below may reference images not displayed]

FINDINGS: Lateral subluxation of the tibia. Tricompartmental degenerative
change with joint space narrowing and spurring. Patellofemoral joint
space narrowing and extensive spurring.

Negative for fracture or mass.
IMPRESSION: Moderate to advanced osteoarthritis left knee

## 2015-01-14 ENCOUNTER — Other Ambulatory Visit: Payer: Self-pay | Admitting: Physician Assistant

## 2015-01-15 ENCOUNTER — Other Ambulatory Visit: Payer: Self-pay | Admitting: Physician Assistant

## 2015-02-13 ENCOUNTER — Ambulatory Visit: Payer: Medicare HMO | Admitting: Physician Assistant

## 2015-02-23 ENCOUNTER — Ambulatory Visit (INDEPENDENT_AMBULATORY_CARE_PROVIDER_SITE_OTHER): Payer: Commercial Managed Care - HMO | Admitting: Physician Assistant

## 2015-02-23 ENCOUNTER — Encounter: Payer: Self-pay | Admitting: Physician Assistant

## 2015-02-23 VITALS — BP 141/87 | HR 97 | Wt 223.0 lb

## 2015-02-23 DIAGNOSIS — E559 Vitamin D deficiency, unspecified: Secondary | ICD-10-CM

## 2015-02-23 DIAGNOSIS — R7309 Other abnormal glucose: Secondary | ICD-10-CM | POA: Diagnosis not present

## 2015-02-23 DIAGNOSIS — F329 Major depressive disorder, single episode, unspecified: Secondary | ICD-10-CM | POA: Diagnosis not present

## 2015-02-23 DIAGNOSIS — Z79899 Other long term (current) drug therapy: Secondary | ICD-10-CM

## 2015-02-23 DIAGNOSIS — I10 Essential (primary) hypertension: Secondary | ICD-10-CM | POA: Diagnosis not present

## 2015-02-23 DIAGNOSIS — Z23 Encounter for immunization: Secondary | ICD-10-CM | POA: Diagnosis not present

## 2015-02-23 DIAGNOSIS — F32A Depression, unspecified: Secondary | ICD-10-CM

## 2015-02-23 DIAGNOSIS — M170 Bilateral primary osteoarthritis of knee: Secondary | ICD-10-CM

## 2015-02-23 DIAGNOSIS — E785 Hyperlipidemia, unspecified: Secondary | ICD-10-CM

## 2015-02-23 DIAGNOSIS — R7303 Prediabetes: Secondary | ICD-10-CM | POA: Diagnosis not present

## 2015-02-23 DIAGNOSIS — F411 Generalized anxiety disorder: Secondary | ICD-10-CM

## 2015-02-23 LAB — COMPLETE METABOLIC PANEL WITH GFR
ALT: 10 U/L (ref 9–46)
AST: 14 U/L (ref 10–35)
Albumin: 4.1 g/dL (ref 3.6–5.1)
Alkaline Phosphatase: 41 U/L (ref 40–115)
BUN: 14 mg/dL (ref 7–25)
CO2: 25 mmol/L (ref 20–31)
Calcium: 8.9 mg/dL (ref 8.6–10.3)
Chloride: 103 mmol/L (ref 98–110)
Creat: 1.25 mg/dL — ABNORMAL HIGH (ref 0.70–1.18)
GFR, EST NON AFRICAN AMERICAN: 57 mL/min — AB (ref >=60)
GFR, Est African American: 66 mL/min (ref >=60)
GLUCOSE: 89 mg/dL (ref 65–99)
POTASSIUM: 4.4 mmol/L (ref 3.5–5.3)
SODIUM: 140 mmol/L (ref 135–146)
TOTAL PROTEIN: 7 g/dL (ref 6.1–8.1)
Total Bilirubin: 0.3 mg/dL (ref 0.2–1.2)

## 2015-02-23 LAB — LIPID PANEL
CHOL/HDL RATIO: 4.7 ratio (ref <=5.0)
Cholesterol: 214 mg/dL — ABNORMAL HIGH (ref 125–200)
HDL: 46 mg/dL (ref >=40)
LDL CALC: 123 mg/dL (ref <130)
Triglycerides: 227 mg/dL — ABNORMAL HIGH (ref <150)
VLDL: 45 mg/dL — ABNORMAL HIGH (ref <30)

## 2015-02-23 LAB — HEMOGLOBIN A1C
Hgb A1c MFr Bld: 6.4 % — ABNORMAL HIGH (ref <5.7)
MEAN PLASMA GLUCOSE: 137 mg/dL — AB (ref <117)

## 2015-02-23 MED ORDER — HYDROCODONE-ACETAMINOPHEN 5-325 MG PO TABS: ORAL_TABLET | ORAL | Status: DC

## 2015-02-23 MED ORDER — MELOXICAM 15 MG PO TABS: ORAL_TABLET | ORAL | Status: DC

## 2015-02-23 MED ORDER — ENALAPRIL MALEATE 20 MG PO TABS: 20.0000 mg | ORAL_TABLET | Freq: Two times a day (BID) | ORAL | Status: DC

## 2015-02-23 MED ORDER — ALPRAZOLAM 0.5 MG PO TABS: 0.5000 mg | ORAL_TABLET | Freq: Two times a day (BID) | ORAL | Status: DC | PRN

## 2015-02-23 MED ORDER — SERTRALINE HCL 100 MG PO TABS: ORAL_TABLET | ORAL | Status: DC

## 2015-02-23 NOTE — Progress Notes (Signed)
   Subjective:    Patient ID: Raymond Hardin, male    DOB: [birth year], 74 y.o.   MRN: [id number]  HPI  Patient presents to clinic for a medication refill.  Depression/anxiety-doing fairly well. His sister continues to work on his nerves. He is taking his medications regularly. He needs refill today. He denies any suicidal or homicidal thoughts.  Hypertension-no chest pains, palpitations, shortness of breath. Taking medication daily.  He continues to have right knee pain. He did have the left knee replaced a few months back. It is doing much better. He still has some stiffness in left knee but overall doing better. He continues to take mobile daily. He does request some Norco for as needed pain control of right knee.    Review of Systems  All other systems reviewed and are negative.      Objective:   Physical Exam  Constitutional: He is oriented to person, place, and time. He appears well-developed and well-nourished.  HENT:  Head: Normocephalic and atraumatic.  Cardiovascular: Normal rate, regular rhythm and normal heart sounds.   Pulmonary/Chest: Effort normal and breath sounds normal.  Neurological: He is alert and oriented to person, place, and time.  Skin: Skin is dry.  Psychiatric: He has a normal mood and affect. His behavior is normal.          Assessment & Plan:  Depression/anxiety- PHQ-9 was 4. GAD-7 was 4. Doing well. Refilled zoloft and xanax to use as needed. Follow up in 6 months.   HTN- under 150/90, controlled. Continue and refilled enalapril.   S/P left knee replacement/OA of right knee- continue mobic and norco as needed. Discussed trying tumeric for overall inflammation. Pt is aware needs replacement of right knee as well.   History of prediabetes-A1c ordered today.  Pt is also seeing [facility] however I feel like he is not going regularly in labs being ordered. I ordered fasting labs today.

## 2015-02-24 ENCOUNTER — Encounter: Payer: Self-pay | Admitting: Physician Assistant

## 2015-02-24 DIAGNOSIS — E781 Pure hyperglyceridemia: Secondary | ICD-10-CM | POA: Insufficient documentation

## 2015-02-24 DIAGNOSIS — E785 Hyperlipidemia, unspecified: Secondary | ICD-10-CM | POA: Insufficient documentation

## 2015-02-24 LAB — VITAMIN D 25 HYDROXY (VIT D DEFICIENCY, FRACTURES): Vit D, 25-Hydroxy: 22 ng/mL — ABNORMAL LOW (ref 30–100)

## 2015-03-23 ENCOUNTER — Other Ambulatory Visit: Payer: Self-pay | Admitting: Family Medicine

## 2015-03-30 ENCOUNTER — Telehealth: Payer: Self-pay | Admitting: *Deleted

## 2015-03-30 MED ORDER — HYDROCODONE-ACETAMINOPHEN 5-325 MG PO TABS: ORAL_TABLET | ORAL | Status: DC

## 2015-03-30 NOTE — Telephone Encounter (Signed)
I personally voided & shredded the Hydrocodone fill that was written on 11/14 because it was too early.  I am giving another refill today.

## 2015-04-06 ENCOUNTER — Other Ambulatory Visit: Payer: Self-pay | Admitting: Family Medicine

## 2015-05-06 DIAGNOSIS — I951 Orthostatic hypotension: Secondary | ICD-10-CM | POA: Diagnosis not present

## 2015-05-06 DIAGNOSIS — R531 Weakness: Secondary | ICD-10-CM | POA: Diagnosis not present

## 2015-05-06 DIAGNOSIS — G4730 Sleep apnea, unspecified: Secondary | ICD-10-CM | POA: Diagnosis not present

## 2015-05-06 DIAGNOSIS — Z96612 Presence of left artificial shoulder joint: Secondary | ICD-10-CM | POA: Diagnosis not present

## 2015-05-06 DIAGNOSIS — I10 Essential (primary) hypertension: Secondary | ICD-10-CM | POA: Diagnosis not present

## 2015-05-06 DIAGNOSIS — R0602 Shortness of breath: Secondary | ICD-10-CM | POA: Diagnosis not present

## 2015-05-06 DIAGNOSIS — F17210 Nicotine dependence, cigarettes, uncomplicated: Secondary | ICD-10-CM | POA: Diagnosis not present

## 2015-05-06 DIAGNOSIS — M1990 Unspecified osteoarthritis, unspecified site: Secondary | ICD-10-CM | POA: Diagnosis not present

## 2015-05-06 DIAGNOSIS — R42 Dizziness and giddiness: Secondary | ICD-10-CM | POA: Diagnosis not present

## 2015-05-06 DIAGNOSIS — F329 Major depressive disorder, single episode, unspecified: Secondary | ICD-10-CM | POA: Diagnosis not present

## 2015-05-06 DIAGNOSIS — E785 Hyperlipidemia, unspecified: Secondary | ICD-10-CM | POA: Diagnosis not present

## 2015-05-06 DIAGNOSIS — Z79899 Other long term (current) drug therapy: Secondary | ICD-10-CM | POA: Diagnosis not present

## 2015-05-21 ENCOUNTER — Other Ambulatory Visit: Payer: Self-pay | Admitting: Physician Assistant

## 2015-05-25 ENCOUNTER — Other Ambulatory Visit: Payer: Self-pay | Admitting: Physician Assistant

## 2015-06-15 ENCOUNTER — Other Ambulatory Visit: Payer: Self-pay | Admitting: Physician Assistant

## 2015-06-15 NOTE — Telephone Encounter (Signed)
Okay to fill? Last sent on 05/22/15.

## 2015-06-22 DIAGNOSIS — Z0100 Encounter for examination of eyes and vision without abnormal findings: Secondary | ICD-10-CM | POA: Diagnosis not present

## 2015-06-22 DIAGNOSIS — I10 Essential (primary) hypertension: Secondary | ICD-10-CM | POA: Diagnosis not present

## 2015-07-06 ENCOUNTER — Other Ambulatory Visit: Payer: Self-pay | Admitting: Physician Assistant

## 2015-08-04 ENCOUNTER — Other Ambulatory Visit: Payer: Self-pay | Admitting: Physician Assistant

## 2015-08-09 DIAGNOSIS — I10 Essential (primary) hypertension: Secondary | ICD-10-CM | POA: Diagnosis not present

## 2015-08-09 DIAGNOSIS — R030 Elevated blood-pressure reading, without diagnosis of hypertension: Secondary | ICD-10-CM | POA: Diagnosis not present

## 2015-08-09 DIAGNOSIS — G319 Degenerative disease of nervous system, unspecified: Secondary | ICD-10-CM | POA: Diagnosis not present

## 2015-08-09 DIAGNOSIS — R110 Nausea: Secondary | ICD-10-CM | POA: Diagnosis not present

## 2015-08-09 DIAGNOSIS — R112 Nausea with vomiting, unspecified: Secondary | ICD-10-CM | POA: Diagnosis not present

## 2015-08-09 DIAGNOSIS — G4730 Sleep apnea, unspecified: Secondary | ICD-10-CM | POA: Diagnosis not present

## 2015-08-09 DIAGNOSIS — Z720 Tobacco use: Secondary | ICD-10-CM | POA: Diagnosis not present

## 2015-08-09 DIAGNOSIS — F329 Major depressive disorder, single episode, unspecified: Secondary | ICD-10-CM | POA: Diagnosis not present

## 2015-08-09 DIAGNOSIS — Z96612 Presence of left artificial shoulder joint: Secondary | ICD-10-CM | POA: Diagnosis not present

## 2015-08-09 DIAGNOSIS — M1990 Unspecified osteoarthritis, unspecified site: Secondary | ICD-10-CM | POA: Diagnosis not present

## 2015-08-09 DIAGNOSIS — I6782 Cerebral ischemia: Secondary | ICD-10-CM | POA: Diagnosis not present

## 2015-08-09 DIAGNOSIS — E785 Hyperlipidemia, unspecified: Secondary | ICD-10-CM | POA: Diagnosis not present

## 2015-08-09 DIAGNOSIS — R51 Headache: Secondary | ICD-10-CM | POA: Diagnosis not present

## 2015-08-24 ENCOUNTER — Encounter: Payer: Self-pay | Admitting: Physician Assistant

## 2015-08-24 ENCOUNTER — Ambulatory Visit (INDEPENDENT_AMBULATORY_CARE_PROVIDER_SITE_OTHER): Payer: Medicare HMO | Admitting: Physician Assistant

## 2015-08-24 VITALS — BP 152/84 | HR 91 | Ht 70.5 in | Wt 219.0 lb

## 2015-08-24 DIAGNOSIS — F329 Major depressive disorder, single episode, unspecified: Secondary | ICD-10-CM | POA: Diagnosis not present

## 2015-08-24 DIAGNOSIS — R748 Abnormal levels of other serum enzymes: Secondary | ICD-10-CM

## 2015-08-24 DIAGNOSIS — R7303 Prediabetes: Secondary | ICD-10-CM | POA: Diagnosis not present

## 2015-08-24 DIAGNOSIS — R7309 Other abnormal glucose: Secondary | ICD-10-CM | POA: Diagnosis not present

## 2015-08-24 DIAGNOSIS — M170 Bilateral primary osteoarthritis of knee: Secondary | ICD-10-CM

## 2015-08-24 DIAGNOSIS — R7989 Other specified abnormal findings of blood chemistry: Secondary | ICD-10-CM

## 2015-08-24 DIAGNOSIS — F32A Depression, unspecified: Secondary | ICD-10-CM

## 2015-08-24 DIAGNOSIS — I10 Essential (primary) hypertension: Secondary | ICD-10-CM | POA: Diagnosis not present

## 2015-08-24 LAB — COMPLETE METABOLIC PANEL WITHOUT GFR
ALT: 5 U/L — ABNORMAL LOW (ref 9–46)
AST: 13 U/L (ref 10–35)
Albumin: 3.6 g/dL (ref 3.6–5.1)
Alkaline Phosphatase: 40 U/L (ref 40–115)
BUN: 17 mg/dL (ref 7–25)
CO2: 25 mmol/L (ref 20–31)
Calcium: 8.5 mg/dL — ABNORMAL LOW (ref 8.6–10.3)
Chloride: 104 mmol/L (ref 98–110)
Creat: 1.34 mg/dL — ABNORMAL HIGH (ref 0.70–1.18)
GFR, Est African American: 61 mL/min
GFR, Est Non African American: 53 mL/min — ABNORMAL LOW
Glucose, Bld: 87 mg/dL (ref 65–99)
Potassium: 4.3 mmol/L (ref 3.5–5.3)
Sodium: 140 mmol/L (ref 135–146)
Total Bilirubin: 0.4 mg/dL (ref 0.2–1.2)
Total Protein: 6.4 g/dL (ref 6.1–8.1)

## 2015-08-24 MED ORDER — SERTRALINE HCL 100 MG PO TABS: ORAL_TABLET | ORAL | Status: DC

## 2015-08-24 MED ORDER — HYDROCODONE-ACETAMINOPHEN 5-325 MG PO TABS: ORAL_TABLET | ORAL | Status: DC

## 2015-08-24 MED ORDER — ENALAPRIL MALEATE 20 MG PO TABS: 20.0000 mg | ORAL_TABLET | Freq: Two times a day (BID) | ORAL | Status: DC

## 2015-08-24 MED ORDER — ALPRAZOLAM 0.5 MG PO TABS: ORAL_TABLET | ORAL | Status: DC

## 2015-08-24 NOTE — Progress Notes (Signed)
   Subjective:    Patient ID: Raymond Hardin, male    DOB: 1940-12-11, 75 y.o.   MRN: [id number]  HPI Pt is a 75 yo male who presents to the clinic for 6 month follow up and refills.   He is doing well. Still having some knee pain that is off and on with intensity. He laid his bike down the other day and pain has been worse since. He would like some as needed norco.   His depression and anxiety is good. Has good and bad days. Still lives with sister and that makes his anxiety worse. On zoloft and taking xanax up to 2 times a day.   Not checking blood pressure. No CP, palpitations, headaches, dizziness. Last labs elevated creatine. Never rechecked.    Review of Systems  All other systems reviewed and are negative.      Objective:   Physical Exam  Constitutional: He is oriented to person, place, and time. He appears well-developed and well-nourished.  HENT:  Head: Normocephalic and atraumatic.  Cardiovascular: Normal rate, regular rhythm and normal heart sounds.   Pulmonary/Chest: Effort normal and breath sounds normal. He has no wheezes.  Neurological: He is alert and oriented to person, place, and time.  Psychiatric: He has a normal mood and affect. His behavior is normal.          Assessment & Plan:  Depression/Anxiety- PHQ-9 was 6. GAD-7 was 6. Refilled zoloft. Refilled xanax. Discussed sedation and abuse potential only use when needed and NO more than twice a day.   HTN/elevated serum creatine- never rechecked after elevated levels. cmp ordered today. BP a little elevated but goal over 65 is 150/90 better at 2nd recheck. Enalapril refilled.   Pre-diabetes- A1C ordered. Continue with low sugar/carb diet.   Primary OA of knees- as needed norco ordered. Pt not using regularly. Last refill was 2016.

## 2015-08-25 ENCOUNTER — Encounter: Payer: Self-pay | Admitting: Physician Assistant

## 2015-08-25 LAB — HEMOGLOBIN A1C
HEMOGLOBIN A1C: 6.4 % — AB (ref <5.7)
Mean Plasma Glucose: 137 mg/dL

## 2015-08-31 ENCOUNTER — Encounter: Payer: Self-pay | Admitting: Sports Medicine

## 2015-08-31 ENCOUNTER — Ambulatory Visit (INDEPENDENT_AMBULATORY_CARE_PROVIDER_SITE_OTHER): Payer: Commercial Managed Care - HMO | Admitting: Sports Medicine

## 2015-08-31 ENCOUNTER — Telehealth: Payer: Self-pay | Admitting: Sports Medicine

## 2015-08-31 VITALS — BP 169/95 | HR 98 | Resp 18 | Wt 218.0 lb

## 2015-08-31 DIAGNOSIS — M1711 Unilateral primary osteoarthritis, right knee: Secondary | ICD-10-CM

## 2015-08-31 MED ORDER — OXYCODONE-ACETAMINOPHEN 5-325 MG PO TABS: 1.0000 | ORAL_TABLET | Freq: Three times a day (TID) | ORAL | Status: DC | PRN

## 2015-08-31 NOTE — Telephone Encounter (Signed)
Received the following information from [employer] benefits investigation:  Patient has Medicare Advantage HMO plan plan with an effective date of 05/09/2012. Orthovisc is no longer considered preferred products with [employer] as of 05-09-14. To obtain approval for orthovisc we recommend submitting a pre-cert along with a letter of medical necessity to [facility] by calling [phone] or faxing to [phone]. [id number] is covered at 80% & [id number] is covered at 100% of the contracted rate when performed in an office setting. If Out of pocket is met, coverage goes to 100%. $45.00 copay for specialist office visit applies. REF# [id number]  Will route to ordering Provider to see if New Rx will be sent of if we will attempt to get pre-cert.

## 2015-08-31 NOTE — Telephone Encounter (Addendum)
I'm going to try Supartz. Where should I send it?

## 2015-08-31 NOTE — Progress Notes (Addendum)
  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 For the past several days he said increasing pain, swelling in the right knee, worse at the joint lines. Pain is moderate, persistent without radiation, no mechanical symptoms, no trauma.  Past medical history, Surgical history, Family history not pertinant except as noted below, Social history, Allergies, and medications have been entered into the medical record, reviewed, and no changes needed.   Review of Systems: No fevers, chills, night sweats, weight loss, chest pain, or shortness of breath.   Objective:    General: Well Developed, well nourished, and in no acute distress.  Neuro: Alert and oriented x3, extra-ocular muscles intact, sensation grossly intact.  HEENT: Normocephalic, atraumatic, pupils equal round reactive to light, neck supple, no masses, no lymphadenopathy, thyroid nonpalpable.  Skin: Warm and dry, no rashes. Cardiac: Regular rate and rhythm, no murmurs rubs or gallops, no lower extremity edema.  Respiratory: Clear to auscultation bilaterally. Not using accessory muscles, speaking in full sentences. Right Knee: Visibly swollen with a palpable fluid wave and effusion. ROM normal in flexion and extension and lower leg rotation. Ligaments with solid consistent endpoints including ACL, PCL, LCL, MCL. Negative Mcmurray's and provocative meniscal tests. Non painful patellar compression. Patellar and quadriceps tendons unremarkable. Hamstring and quadriceps strength is normal.  Procedure: Real-time Ultrasound Guided aspiration/Injection of right knee Device: GE Logiq E  Verbal informed consent obtained.  Time-out conducted.  Noted no overlying erythema, induration, or other signs of local infection.  Skin prepped in a sterile fashion.  Local anesthesia: Topical Ethyl chloride.  With sterile technique and  under real time ultrasound guidance:  Aspirated 25 mL straw-colored fluid, syringe switched and 2 mL kenalog 40, 2 mL lidocaine, 2 mL Marcaine injected easily. Completed without difficulty  Pain immediately resolved suggesting accurate placement of the medication.  Advised to call if fevers/chills, erythema, induration, drainage, or persistent bleeding.  Images permanently stored and available for review in the ultrasound unit.  Impression: Technically successful ultrasound guided injection.  Impression and Recommendations:

## 2015-08-31 NOTE — Telephone Encounter (Signed)
Submitted for approval on Orthovisc. Awaiting confirmation.  

## 2015-08-31 NOTE — Telephone Encounter (Signed)
I would try [employer] [facility]? A pharmacy was not specified on the OV benefits investigation.

## 2015-08-31 NOTE — Assessment & Plan Note (Addendum)
Aspiration and injection, patient will return when Orthovisc is approved. Short term course of Percocet given.

## 2015-08-31 NOTE — Telephone Encounter (Signed)
-----   Message from Silverio Decamp, MD sent at 08/31/2015 11:22 AM EDT ----- Primary right knee osteoarthritis, x-ray confirmed, failed NSAIDs, steroid injection. Orthovisc approval please. Preesh Boogs.

## 2015-08-31 NOTE — Addendum Note (Signed)
Addended by: Silverio Decamp on: 08/31/2015 11:32 AM   Modules accepted: Orders

## 2015-09-01 MED ORDER — SODIUM HYALURONATE (VISCOSUP) 25 MG/2.5ML IX SOSY: PREFILLED_SYRINGE | INTRA_ARTICULAR | Status: DC

## 2015-09-01 NOTE — Telephone Encounter (Signed)
Done

## 2015-09-01 NOTE — Addendum Note (Signed)
Addended by: Silverio Decamp on: 09/01/2015 12:55 PM   Modules accepted: Orders

## 2015-09-10 ENCOUNTER — Ambulatory Visit (INDEPENDENT_AMBULATORY_CARE_PROVIDER_SITE_OTHER): Payer: Commercial Managed Care - HMO | Admitting: Sports Medicine

## 2015-09-10 ENCOUNTER — Encounter: Payer: Self-pay | Admitting: Sports Medicine

## 2015-09-10 VITALS — BP 146/83 | HR 98 | Resp 18 | Wt 213.1 lb

## 2015-09-10 DIAGNOSIS — M1711 Unilateral primary osteoarthritis, right knee: Secondary | ICD-10-CM | POA: Diagnosis not present

## 2015-09-10 MED ORDER — OXYCODONE-ACETAMINOPHEN 5-325 MG PO TABS: 1.0000 | ORAL_TABLET | Freq: Three times a day (TID) | ORAL | Status: DC | PRN

## 2015-09-10 NOTE — Progress Notes (Signed)
  Procedure: Real-time Ultrasound Guided Injection of right knee Device: GE Logiq E  Verbal informed consent obtained.  Time-out conducted.  Noted no overlying erythema, induration, or other signs of local infection.  Skin prepped in a sterile fashion.  Local anesthesia: Topical Ethyl chloride.  With sterile technique and under real time ultrasound guidance:  25 mg/2.5 mL of Supartz (sodium hyaluronate) in a prefilled syringe was injected easily into the knee through a 22-gauge needle.  Completed without difficulty  Pain immediately resolved suggesting accurate placement of the medication.  Advised to call if fevers/chills, erythema, induration, drainage, or persistent bleeding.  Images permanently stored and available for review in the ultrasound unit.  Impression: Technically successful ultrasound guided injection. 

## 2015-09-10 NOTE — Telephone Encounter (Signed)
Supartz injections have been delivered to office. Contacted Pt and transferred to scheduling for appts.

## 2015-09-10 NOTE — Addendum Note (Signed)
Addended by: Silverio Decamp on: 09/10/2015 10:52 AM   Modules accepted: Orders

## 2015-09-10 NOTE — Assessment & Plan Note (Signed)
Supartz injection #1 of 5, return in one week for #2

## 2015-09-17 ENCOUNTER — Encounter: Payer: Self-pay | Admitting: Sports Medicine

## 2015-09-17 ENCOUNTER — Ambulatory Visit (INDEPENDENT_AMBULATORY_CARE_PROVIDER_SITE_OTHER): Payer: Commercial Managed Care - HMO | Admitting: Sports Medicine

## 2015-09-17 VITALS — BP 144/84 | HR 85 | Resp 18 | Wt 215.2 lb

## 2015-09-17 DIAGNOSIS — M1711 Unilateral primary osteoarthritis, right knee: Secondary | ICD-10-CM | POA: Diagnosis not present

## 2015-09-17 NOTE — Progress Notes (Signed)
  Procedure: Real-time Ultrasound Guided Injection of right knee Device: GE Logiq E  Verbal informed consent obtained.  Time-out conducted.  Noted no overlying erythema, induration, or other signs of local infection.  Skin prepped in a sterile fashion.  Local anesthesia: Topical Ethyl chloride.  With sterile technique and under real time ultrasound guidance:  25 mg/2.5 mL of Supartz (sodium hyaluronate) in a prefilled syringe was injected easily into the knee through a 22-gauge needle.  Completed without difficulty  Pain immediately resolved suggesting accurate placement of the medication.  Advised to call if fevers/chills, erythema, induration, drainage, or persistent bleeding.  Images permanently stored and available for review in the ultrasound unit.  Impression: Technically successful ultrasound guided injection. 

## 2015-09-17 NOTE — Addendum Note (Signed)
Addended by: Elizabeth Sauer on: 09/17/2015 10:45 AM   Modules accepted: Medications

## 2015-09-17 NOTE — Assessment & Plan Note (Signed)
Supartz injection #2 of 5 into the right knee, return in one week for #3

## 2015-09-24 ENCOUNTER — Ambulatory Visit (INDEPENDENT_AMBULATORY_CARE_PROVIDER_SITE_OTHER): Payer: Commercial Managed Care - HMO | Admitting: Sports Medicine

## 2015-09-24 VITALS — BP 125/77 | HR 97 | Resp 18 | Wt 215.7 lb

## 2015-09-24 DIAGNOSIS — M1711 Unilateral primary osteoarthritis, right knee: Secondary | ICD-10-CM

## 2015-09-24 NOTE — Progress Notes (Signed)
  Procedure: Real-time Ultrasound Guided Injection of right knee Device: GE Logiq E  Verbal informed consent obtained.  Time-out conducted.  Noted no overlying erythema, induration, or other signs of local infection.  Skin prepped in a sterile fashion.  Local anesthesia: Topical Ethyl chloride.  With sterile technique and under real time ultrasound guidance:  25 mg/2.5 mL of Supartz (sodium hyaluronate) in a prefilled syringe was injected easily into the knee through a 22-gauge needle.  Completed without difficulty  Pain immediately resolved suggesting accurate placement of the medication.  Advised to call if fevers/chills, erythema, induration, drainage, or persistent bleeding.  Images permanently stored and available for review in the ultrasound unit.  Impression: Technically successful ultrasound guided injection. 

## 2015-09-24 NOTE — Assessment & Plan Note (Signed)
Supartz injection #3 of 5 into the right knee, return in one week for #4, starting to feel a benefit. No more narcotics.

## 2015-10-01 ENCOUNTER — Ambulatory Visit (INDEPENDENT_AMBULATORY_CARE_PROVIDER_SITE_OTHER): Payer: Commercial Managed Care - HMO | Admitting: Sports Medicine

## 2015-10-01 ENCOUNTER — Encounter: Payer: Self-pay | Admitting: Sports Medicine

## 2015-10-01 VITALS — BP 160/73 | HR 87 | Resp 18 | Wt 219.5 lb

## 2015-10-01 DIAGNOSIS — M1711 Unilateral primary osteoarthritis, right knee: Secondary | ICD-10-CM

## 2015-10-01 NOTE — Progress Notes (Signed)
  Procedure: Real-time Ultrasound Guided Injection of right knee Device: GE Logiq E  Verbal informed consent obtained.  Time-out conducted.  Noted no overlying erythema, induration, or other signs of local infection.  Skin prepped in a sterile fashion.  Local anesthesia: Topical Ethyl chloride.  With sterile technique and under real time ultrasound guidance:  25 mg/2.5 mL of Supartz (sodium hyaluronate) in a prefilled syringe was injected easily into the knee through a 22-gauge needle.  Completed without difficulty  Pain immediately resolved suggesting accurate placement of the medication.  Advised to call if fevers/chills, erythema, induration, drainage, or persistent bleeding.  Images permanently stored and available for review in the ultrasound unit.  Impression: Technically successful ultrasound guided injection. 

## 2015-10-01 NOTE — Assessment & Plan Note (Signed)
Supartz injection #4 of 5, return in one week for #5

## 2015-10-01 NOTE — Addendum Note (Signed)
Addended by: Elizabeth Sauer on: 10/01/2015 11:46 AM   Modules accepted: Orders, Medications

## 2015-10-08 ENCOUNTER — Ambulatory Visit (INDEPENDENT_AMBULATORY_CARE_PROVIDER_SITE_OTHER): Payer: Commercial Managed Care - HMO | Admitting: Sports Medicine

## 2015-10-08 ENCOUNTER — Encounter: Payer: Self-pay | Admitting: Sports Medicine

## 2015-10-08 DIAGNOSIS — M1711 Unilateral primary osteoarthritis, right knee: Secondary | ICD-10-CM

## 2015-10-08 NOTE — Assessment & Plan Note (Signed)
Supartz injection #5 of 5, at this point he has failed several steroid injections, as well as several courses of viscosupplementation and he is a candidate for right total knee arthroplasty. He's doing well with regards to his left knee arthroplasty. Referral to Dr. Berenice Primas.

## 2015-10-08 NOTE — Progress Notes (Signed)
  Procedure: Real-time Ultrasound Guided Injection of right knee Device: GE Logiq E  Verbal informed consent obtained.  Time-out conducted.  Noted no overlying erythema, induration, or other signs of local infection.  Skin prepped in a sterile fashion.  Local anesthesia: Topical Ethyl chloride.  With sterile technique and under real time ultrasound guidance:  25 mg/2.5 mL of Supartz (sodium hyaluronate) in a prefilled syringe was injected easily into the knee through a 22-gauge needle.  Completed without difficulty  Pain immediately resolved suggesting accurate placement of the medication.  Advised to call if fevers/chills, erythema, induration, drainage, or persistent bleeding.  Images permanently stored and available for review in the ultrasound unit.  Impression: Technically successful ultrasound guided injection. 

## 2015-10-15 ENCOUNTER — Other Ambulatory Visit: Payer: Self-pay | Admitting: Physician Assistant

## 2015-10-16 ENCOUNTER — Other Ambulatory Visit: Payer: Self-pay | Admitting: *Deleted

## 2015-10-16 ENCOUNTER — Telehealth: Payer: Self-pay

## 2015-10-16 MED ORDER — HYDROCODONE-ACETAMINOPHEN 5-325 MG PO TABS: ORAL_TABLET | ORAL | Status: DC

## 2015-10-16 NOTE — Telephone Encounter (Signed)
Spoke with Dr. Darene Lamer and since he is managing your knee pain he does not feel like you need any more narcotics for pain. Do you have ibuprofen 800mg  up to three times a day for pain. He wants you to get knee replacement to resolve problem completely.

## 2015-10-16 NOTE — Telephone Encounter (Signed)
No further narcotics for concern that he will put off knee replacement.

## 2015-10-16 NOTE — Telephone Encounter (Signed)
Raymond Hardin states he has an appointment July 6 for knee replacement. He was hoping to get some hydrocodone to last until that appointment. Please advise.

## 2015-10-16 NOTE — Telephone Encounter (Signed)
Left detailed message advising of recommendations.  

## 2015-10-16 NOTE — Telephone Encounter (Signed)
Dadrian called and states she wants a refill on hydrocodone. The hydrocodone is not on current medication list. He states his knees are hurting him. Please advise.

## 2015-10-19 NOTE — Telephone Encounter (Signed)
Left message that hydrocodone will not be refilled that he can take ibuprofen 800mg  tid as needed for pain.

## 2015-12-01 ENCOUNTER — Telehealth: Payer: Self-pay | Admitting: Physician Assistant

## 2015-12-01 DIAGNOSIS — N183 Chronic kidney disease, stage 3 unspecified: Secondary | ICD-10-CM

## 2015-12-01 DIAGNOSIS — R944 Abnormal results of kidney function studies: Secondary | ICD-10-CM

## 2015-12-01 NOTE — Telephone Encounter (Signed)
-----   Message from Donella Stade, [place] sent at 08/31/2015  1:32 PM EDT ----- Remind to recheck CMP/kidney function due to increase.

## 2015-12-02 ENCOUNTER — Telehealth: Payer: Self-pay

## 2015-12-03 DIAGNOSIS — R944 Abnormal results of kidney function studies: Secondary | ICD-10-CM | POA: Diagnosis not present

## 2015-12-03 NOTE — Telephone Encounter (Signed)
Labs ordered.  Pt wants to know if he can have a refill on his vicodin.  Please advise.

## 2015-12-04 NOTE — Telephone Encounter (Signed)
I sent note to Raymond Hardin regarding norco.

## 2015-12-04 NOTE — Telephone Encounter (Signed)
Pt was supposed to have ortho appt on July 6th. We were only giving enough to last until then. Ortho needs to take over or have procedure to elminate pain.

## 2015-12-04 NOTE — Telephone Encounter (Signed)
Patient advised to come in for labs. He states he cancelled his ortho appointment because it is in [place]. His sister doesn't drive and he would have no way home. He is wanting to get flexogenix on his knees. He will call back with the provider's name for the referral.

## 2015-12-05 LAB — COMPLETE METABOLIC PANEL WITH GFR
ALBUMIN: 4 g/dL (ref 3.6–5.1)
ALT: 8 U/L — ABNORMAL LOW (ref 9–46)
AST: 14 U/L (ref 10–35)
Alkaline Phosphatase: 40 U/L (ref 40–115)
BUN: 16 mg/dL (ref 7–25)
CHLORIDE: 103 mmol/L (ref 98–110)
CO2: 24 mmol/L (ref 20–31)
Calcium: 9 mg/dL (ref 8.6–10.3)
Creat: 1.48 mg/dL — ABNORMAL HIGH (ref 0.70–1.18)
GFR, Est African American: 53 mL/min — ABNORMAL LOW (ref >=60)
GFR, Est Non African American: 46 mL/min — ABNORMAL LOW (ref >=60)
GLUCOSE: 96 mg/dL (ref 65–99)
POTASSIUM: 4.5 mmol/L (ref 3.5–5.3)
SODIUM: 141 mmol/L (ref 135–146)
Total Bilirubin: 0.4 mg/dL (ref 0.2–1.2)
Total Protein: 6.5 g/dL (ref 6.1–8.1)

## 2015-12-07 DIAGNOSIS — N183 Chronic kidney disease, stage 3 unspecified: Secondary | ICD-10-CM | POA: Insufficient documentation

## 2015-12-07 MED ORDER — HYDROCODONE-ACETAMINOPHEN 5-325 MG PO TABS: ORAL_TABLET | ORAL | 0 refills | Status: DC

## 2015-12-23 ENCOUNTER — Telehealth: Payer: Self-pay | Admitting: Physician Assistant

## 2015-12-23 ENCOUNTER — Other Ambulatory Visit: Payer: Self-pay | Admitting: Physician Assistant

## 2015-12-23 DIAGNOSIS — M170 Bilateral primary osteoarthritis of knee: Secondary | ICD-10-CM

## 2015-12-23 NOTE — Telephone Encounter (Signed)
Received call from the [facility] clinic in [place] requesting a referral, Pt has appt scheduled for tomorrow. They are going to be treating the Pt for bilateral knee pain.

## 2015-12-23 NOTE — Telephone Encounter (Signed)
Referral has been placed. 

## 2016-01-12 DIAGNOSIS — M25561 Pain in right knee: Secondary | ICD-10-CM | POA: Diagnosis not present

## 2016-01-12 DIAGNOSIS — M1711 Unilateral primary osteoarthritis, right knee: Secondary | ICD-10-CM | POA: Diagnosis not present

## 2016-01-13 ENCOUNTER — Other Ambulatory Visit: Payer: Self-pay | Admitting: Physician Assistant

## 2016-02-03 ENCOUNTER — Other Ambulatory Visit: Payer: Self-pay | Admitting: Physician Assistant

## 2016-02-23 ENCOUNTER — Ambulatory Visit: Payer: Medicare HMO | Admitting: Physician Assistant

## 2016-02-24 LAB — LIPID PANEL
CHOLESTEROL: 198 mg/dL (ref 0–200)
HDL: 52 mg/dL (ref 35–70)
LDL Cholesterol: 159 mg/dL
TRIGLYCERIDES: 159 mg/dL (ref 40–160)

## 2016-02-24 LAB — BASIC METABOLIC PANEL
BUN: 15 mg/dL (ref 4–21)
CREATININE: 1.4 mg/dL — AB (ref 0.6–1.3)
Glucose: 88 mg/dL
POTASSIUM: 4.7 mmol/L (ref 3.4–5.3)
Sodium: 142 mmol/L (ref 137–147)

## 2016-02-24 LAB — CBC AND DIFFERENTIAL
HCT: 43 % (ref 41–53)
HEMOGLOBIN: 14.3 g/dL (ref 13.5–17.5)
PLATELETS: 186 10*3/uL (ref 150–399)
WBC: 5.8 10*3/mL

## 2016-03-01 ENCOUNTER — Encounter: Payer: Self-pay | Admitting: Physician Assistant

## 2016-03-01 ENCOUNTER — Ambulatory Visit (INDEPENDENT_AMBULATORY_CARE_PROVIDER_SITE_OTHER): Payer: Commercial Managed Care - HMO | Admitting: Physician Assistant

## 2016-03-01 VITALS — BP 161/79 | HR 93 | Ht 70.5 in | Wt 223.0 lb

## 2016-03-01 DIAGNOSIS — Z23 Encounter for immunization: Secondary | ICD-10-CM | POA: Diagnosis not present

## 2016-03-01 DIAGNOSIS — M1711 Unilateral primary osteoarthritis, right knee: Secondary | ICD-10-CM

## 2016-03-01 DIAGNOSIS — F411 Generalized anxiety disorder: Secondary | ICD-10-CM | POA: Diagnosis not present

## 2016-03-01 MED ORDER — ALPRAZOLAM 0.5 MG PO TABS: 0.5000 mg | ORAL_TABLET | Freq: Every evening | ORAL | 5 refills | Status: DC | PRN

## 2016-03-01 MED ORDER — HYDROCODONE-ACETAMINOPHEN 5-325 MG PO TABS: ORAL_TABLET | ORAL | 0 refills | Status: DC

## 2016-03-01 NOTE — Progress Notes (Signed)
   Subjective:    Patient ID: Raymond Hardin, male    DOB: May 07, 1941, 75 y.o.   MRN: [id number]  HPI  Pt is a 75 yo male who presents to the clinic to follow up on right knee pain. He is a little over a year status post left knee replacement which has done great. He sees Dr. Ballard Russell in [place]. He needs to have right knee replaced but having to take care of his sister after she had a fall. He has tried supartz with no relief. He is taking NSAIDs. He wears a supportive brace. He would like something to use as needed for pain.   Pt is also struggling with anxiety and nerves. He takes as needed xanax. He tries to limit himself to one a day. He needs a refill. He is on zoloft more for depression and helping a lot.     Review of Systems See HPI.     Objective:   Physical Exam  Constitutional: He is oriented to person, place, and time. He appears well-developed and well-nourished.  Cardiovascular: Normal rate, regular rhythm and normal heart sounds.   Musculoskeletal:  Right knee in brace.   Neurological: He is alert and oriented to person, place, and time.  Psychiatric: He has a normal mood and affect. His behavior is normal.          Assessment & Plan:  Marland KitchenMarland KitchenDiagnoses and all orders for this visit:  Primary osteoarthritis of right knee -     HYDROcodone-acetaminophen (NORCO/VICODIN) 5-325 MG tablet; TAKE ONE-HALF TABLET EVERY DAY AS NEEDED  Influenza vaccine needed -     Flu Vaccine QUAD 36+ mos PF IM (Fluarix & Fluzone Quad PF)  Anxiety state -     ALPRAZolam (XANAX) 0.5 MG tablet; Take 1 tablet (0.5 mg total) by mouth at bedtime as needed for anxiety.   Follow up in 3 months.

## 2016-03-14 ENCOUNTER — Encounter: Payer: Self-pay | Admitting: Physician Assistant

## 2016-04-25 ENCOUNTER — Other Ambulatory Visit: Payer: Self-pay | Admitting: Family Medicine

## 2016-04-25 ENCOUNTER — Other Ambulatory Visit: Payer: Self-pay | Admitting: Physician Assistant

## 2016-04-25 DIAGNOSIS — M1711 Unilateral primary osteoarthritis, right knee: Secondary | ICD-10-CM

## 2016-06-01 ENCOUNTER — Ambulatory Visit: Payer: Commercial Managed Care - HMO | Admitting: Physician Assistant

## 2016-06-13 ENCOUNTER — Ambulatory Visit: Payer: Commercial Managed Care - HMO | Admitting: Physician Assistant

## 2016-07-11 ENCOUNTER — Encounter: Payer: Self-pay | Admitting: Physician Assistant

## 2016-07-11 ENCOUNTER — Ambulatory Visit (INDEPENDENT_AMBULATORY_CARE_PROVIDER_SITE_OTHER): Payer: Commercial Managed Care - HMO | Admitting: Physician Assistant

## 2016-07-11 VITALS — BP 156/92 | HR 108 | Ht 70.5 in | Wt 228.0 lb

## 2016-07-11 DIAGNOSIS — N529 Male erectile dysfunction, unspecified: Secondary | ICD-10-CM

## 2016-07-11 DIAGNOSIS — I10 Essential (primary) hypertension: Secondary | ICD-10-CM | POA: Diagnosis not present

## 2016-07-11 DIAGNOSIS — F329 Major depressive disorder, single episode, unspecified: Secondary | ICD-10-CM | POA: Diagnosis not present

## 2016-07-11 DIAGNOSIS — E7800 Pure hypercholesterolemia, unspecified: Secondary | ICD-10-CM

## 2016-07-11 DIAGNOSIS — F411 Generalized anxiety disorder: Secondary | ICD-10-CM | POA: Diagnosis not present

## 2016-07-11 DIAGNOSIS — F32A Depression, unspecified: Secondary | ICD-10-CM

## 2016-07-11 MED ORDER — SILDENAFIL CITRATE 20 MG PO TABS: ORAL_TABLET | ORAL | 5 refills | Status: DC

## 2016-07-11 MED ORDER — AMLODIPINE BESYLATE 5 MG PO TABS: 5.0000 mg | ORAL_TABLET | Freq: Every day | ORAL | 0 refills | Status: DC

## 2016-07-11 MED ORDER — ENALAPRIL MALEATE 20 MG PO TABS: 20.0000 mg | ORAL_TABLET | Freq: Two times a day (BID) | ORAL | 5 refills | Status: DC

## 2016-07-11 MED ORDER — SERTRALINE HCL 100 MG PO TABS: ORAL_TABLET | ORAL | 5 refills | Status: DC

## 2016-07-11 MED ORDER — ATORVASTATIN CALCIUM 20 MG PO TABS: 20.0000 mg | ORAL_TABLET | Freq: Every day | ORAL | 1 refills | Status: DC

## 2016-07-11 MED ORDER — ALPRAZOLAM 0.5 MG PO TABS: 0.5000 mg | ORAL_TABLET | Freq: Two times a day (BID) | ORAL | 5 refills | Status: DC | PRN

## 2016-07-11 NOTE — Patient Instructions (Signed)
Continue vasotec.  Add norvasc 5mg  recheck BP in 2-3 weeks.

## 2016-07-13 NOTE — Progress Notes (Signed)
   Subjective:    Patient ID: PASTOR SGRO, male    DOB: 11-24-1940, 76 y.o.   MRN: [id number]  HPI Pt presents to the clinic for 6 month follow up. Pt is a 76 yo ol male.   .. Active Ambulatory Problems    Diagnosis Date Noted  . Hypertension, essential, benign 11/04/2011  . Erectile dysfunction 11/04/2011  . Tobacco abuse 12/12/2012  . Depression 12/12/2012  . Primary osteoarthritis of right knee 04/24/2013  . Vitamin D deficiency 08/13/2013  . Pre-diabetes 08/16/2013  . Status post left knee replacement 08/13/2014  . Anxiety state 08/13/2014  . Essential hypertension, benign 08/13/2014  . Hyperlipidemia 02/24/2015  . Hypertriglyceridemia 02/24/2015  . CKD (chronic kidney disease), stage III 12/07/2015   Resolved Ambulatory Problems    Diagnosis Date Noted  . No Resolved Ambulatory Problems   Past Medical History:  Diagnosis Date  . Depression   . Hyperlipidemia   . Hypertension   . Insomnia 2005  . Osteoarthritis 2000   HTN-reports to taking enalapril bid. Denies any CP, headaches, palpitations, dizziness. He is not checking BP at home.   Anxiety/depression- ok with zoloft. Continues to use xanax up to twice a day for nerves. Some days only uses once.    Review of Systems  All other systems reviewed and are negative.      Objective:   Physical Exam  Constitutional: He is oriented to person, place, and time. He appears well-developed and well-nourished.  HENT:  Head: Normocephalic and atraumatic.  Cardiovascular: Normal rate, regular rhythm and normal heart sounds.   Pulmonary/Chest: Effort normal and breath sounds normal.  Neurological: He is alert and oriented to person, place, and time.  Psychiatric: He has a normal mood and affect. His behavior is normal.          Assessment & Plan:  Marland KitchenMarland KitchenTedford was seen today for hypertension, depression and anxiety.  Diagnoses and all orders for this visit:  Hypertension, essential, benign -     amLODipine  (NORVASC) 5 MG tablet; Take 1 tablet (5 mg total) by mouth daily. -     enalapril (VASOTEC) 20 MG tablet; Take 1 tablet (20 mg total) by mouth 2 (two) times daily.  Anxiety state -     ALPRAZolam (XANAX) 0.5 MG tablet; Take 1 tablet (0.5 mg total) by mouth 2 (two) times daily as needed for anxiety.  Erectile dysfunction, unspecified erectile dysfunction type -     sildenafil (REVATIO) 20 MG tablet; TAKE FIVE TABLETS BY MOUTH AS NEEDED FOR ERECTILE DYSFUNCTION (FOR USE PRIOR TO SEXUAL ACTIVITY)  Depression, unspecified depression type -     sertraline (ZOLOFT) 100 MG tablet; Take one and one/half tablet daily.  Pure hypercholesterolemia -     atorvastatin (LIPITOR) 20 MG tablet; Take 1 tablet (20 mg total) by mouth daily.   Added norvasc to blood pressure. Recheck in 2 weeks. Discussed side effects.  Encouraged to add daily ASA 81mg  daily.   Reviewed labs he brought in from outside source. Cholesterol looks great.   Discussed as needed use of xanax.

## 2016-07-20 DIAGNOSIS — H2513 Age-related nuclear cataract, bilateral: Secondary | ICD-10-CM | POA: Diagnosis not present

## 2016-07-20 DIAGNOSIS — Z0100 Encounter for examination of eyes and vision without abnormal findings: Secondary | ICD-10-CM | POA: Diagnosis not present

## 2016-07-20 DIAGNOSIS — E109 Type 1 diabetes mellitus without complications: Secondary | ICD-10-CM | POA: Diagnosis not present

## 2016-07-25 ENCOUNTER — Encounter: Payer: Self-pay | Admitting: Physician Assistant

## 2016-07-25 ENCOUNTER — Ambulatory Visit (INDEPENDENT_AMBULATORY_CARE_PROVIDER_SITE_OTHER): Payer: Commercial Managed Care - HMO | Admitting: Physician Assistant

## 2016-07-25 VITALS — BP 144/88 | HR 109 | Ht 70.5 in | Wt 230.0 lb

## 2016-07-25 DIAGNOSIS — I10 Essential (primary) hypertension: Secondary | ICD-10-CM | POA: Diagnosis not present

## 2016-07-25 NOTE — Progress Notes (Signed)
   Subjective:    Patient ID: Raymond Hardin, male    DOB: May 25, 1940, 76 y.o.   MRN: [id number]  HPI Pt is a 76 yo male who presents to the clinic for 2 week follow up on HTN. He started norvasc 5mg  daily. He is not checking his BP's at home. He denies any CP, palpitations, headaches, dizziness.    Review of Systems  All other systems reviewed and are negative.      Objective:   Physical Exam  Constitutional: He is oriented to person, place, and time. He appears well-developed and well-nourished.  HENT:  Head: Normocephalic and atraumatic.  Cardiovascular: Normal rate, regular rhythm and normal heart sounds.   Pulmonary/Chest: Effort normal and breath sounds normal.  Neurological: He is alert and oriented to person, place, and time.  Psychiatric: He has a normal mood and affect. His behavior is normal.          Assessment & Plan:  Marland KitchenMarland KitchenDemir was seen today for hypertension.  Diagnoses and all orders for this visit:  Hypertension, essential, benign   Increased norvasc to 10mg (take 2 of the 5mg ). Follow up in 1 week nurse visit BP recheck.  Continue on enalapril.  Recheck BP was better but still not at goal.  Discussed DASH diet.

## 2016-08-01 ENCOUNTER — Ambulatory Visit: Payer: Commercial Managed Care - HMO

## 2016-08-10 ENCOUNTER — Ambulatory Visit: Payer: Medicare HMO

## 2016-08-10 ENCOUNTER — Ambulatory Visit (INDEPENDENT_AMBULATORY_CARE_PROVIDER_SITE_OTHER): Payer: Medicare HMO | Admitting: Physician Assistant

## 2016-08-10 VITALS — BP 119/65 | HR 88 | Ht 70.5 in | Wt 228.1 lb

## 2016-08-10 DIAGNOSIS — I10 Essential (primary) hypertension: Secondary | ICD-10-CM

## 2016-08-10 MED ORDER — AMLODIPINE BESYLATE 10 MG PO TABS: 10.0000 mg | ORAL_TABLET | Freq: Every day | ORAL | 5 refills | Status: DC

## 2016-08-10 MED ORDER — AMLODIPINE BESYLATE 10 MG PO TABS
10.0000 mg | ORAL_TABLET | Freq: Every day | ORAL | 5 refills | Status: DC
Start: 2016-08-10 — End: 2016-08-10

## 2016-08-10 NOTE — Progress Notes (Signed)
Pt informed of results. Pt expressed understanding and is agreeable. Silvio Sausedo CMA, RT 

## 2016-08-10 NOTE — Progress Notes (Signed)
   Subjective:    Patient ID: Raymond Hardin, male    DOB: [birth year], 76 y.o.   MRN: [id number]  HPI Pt is here for a BP check. Pt denies chest pain, shortness of breath, palpitations, headaches, or any problems with medication.    Review of Systems     Objective:   Physical Exam        Assessment & Plan:   Continue on norvasc 10mg  sent over so he can take one tablet daily with enalapril 20mg  twice a day. BP looks great. Follow up in 6 months. Iran Planas PA-C

## 2016-09-12 DIAGNOSIS — M25562 Pain in left knee: Secondary | ICD-10-CM | POA: Diagnosis not present

## 2016-09-12 DIAGNOSIS — M25561 Pain in right knee: Secondary | ICD-10-CM | POA: Diagnosis not present

## 2016-09-12 DIAGNOSIS — M1711 Unilateral primary osteoarthritis, right knee: Secondary | ICD-10-CM | POA: Diagnosis not present

## 2016-09-19 DIAGNOSIS — Z01811 Encounter for preprocedural respiratory examination: Secondary | ICD-10-CM | POA: Diagnosis not present

## 2016-09-19 DIAGNOSIS — Z01818 Encounter for other preprocedural examination: Secondary | ICD-10-CM | POA: Diagnosis not present

## 2016-09-19 DIAGNOSIS — Z471 Aftercare following joint replacement surgery: Secondary | ICD-10-CM | POA: Diagnosis not present

## 2016-09-19 DIAGNOSIS — Z01812 Encounter for preprocedural laboratory examination: Secondary | ICD-10-CM | POA: Diagnosis not present

## 2016-09-19 DIAGNOSIS — M1990 Unspecified osteoarthritis, unspecified site: Secondary | ICD-10-CM | POA: Diagnosis not present

## 2016-09-19 DIAGNOSIS — F419 Anxiety disorder, unspecified: Secondary | ICD-10-CM | POA: Diagnosis not present

## 2016-09-19 DIAGNOSIS — Z01810 Encounter for preprocedural cardiovascular examination: Secondary | ICD-10-CM | POA: Diagnosis not present

## 2016-09-19 DIAGNOSIS — M1711 Unilateral primary osteoarthritis, right knee: Secondary | ICD-10-CM | POA: Diagnosis not present

## 2016-09-19 DIAGNOSIS — F17200 Nicotine dependence, unspecified, uncomplicated: Secondary | ICD-10-CM | POA: Diagnosis not present

## 2016-09-19 DIAGNOSIS — Z7982 Long term (current) use of aspirin: Secondary | ICD-10-CM | POA: Diagnosis not present

## 2016-09-19 DIAGNOSIS — I10 Essential (primary) hypertension: Secondary | ICD-10-CM | POA: Diagnosis not present

## 2016-09-19 DIAGNOSIS — E785 Hyperlipidemia, unspecified: Secondary | ICD-10-CM | POA: Diagnosis not present

## 2016-09-19 DIAGNOSIS — G4733 Obstructive sleep apnea (adult) (pediatric): Secondary | ICD-10-CM | POA: Diagnosis not present

## 2016-09-19 DIAGNOSIS — Z96651 Presence of right artificial knee joint: Secondary | ICD-10-CM | POA: Diagnosis not present

## 2016-09-19 DIAGNOSIS — F329 Major depressive disorder, single episode, unspecified: Secondary | ICD-10-CM | POA: Diagnosis not present

## 2016-10-04 DIAGNOSIS — E785 Hyperlipidemia, unspecified: Secondary | ICD-10-CM | POA: Diagnosis not present

## 2016-10-04 DIAGNOSIS — R531 Weakness: Secondary | ICD-10-CM | POA: Diagnosis not present

## 2016-10-04 DIAGNOSIS — F419 Anxiety disorder, unspecified: Secondary | ICD-10-CM | POA: Diagnosis not present

## 2016-10-04 DIAGNOSIS — F329 Major depressive disorder, single episode, unspecified: Secondary | ICD-10-CM | POA: Diagnosis not present

## 2016-10-04 DIAGNOSIS — F17200 Nicotine dependence, unspecified, uncomplicated: Secondary | ICD-10-CM | POA: Diagnosis not present

## 2016-10-04 DIAGNOSIS — M25562 Pain in left knee: Secondary | ICD-10-CM | POA: Diagnosis not present

## 2016-10-04 DIAGNOSIS — Z96651 Presence of right artificial knee joint: Secondary | ICD-10-CM | POA: Diagnosis not present

## 2016-10-04 DIAGNOSIS — D649 Anemia, unspecified: Secondary | ICD-10-CM | POA: Diagnosis not present

## 2016-10-04 DIAGNOSIS — G4730 Sleep apnea, unspecified: Secondary | ICD-10-CM | POA: Diagnosis not present

## 2016-10-04 DIAGNOSIS — M1711 Unilateral primary osteoarthritis, right knee: Secondary | ICD-10-CM | POA: Diagnosis not present

## 2016-10-04 DIAGNOSIS — Z471 Aftercare following joint replacement surgery: Secondary | ICD-10-CM | POA: Diagnosis not present

## 2016-10-04 DIAGNOSIS — M25561 Pain in right knee: Secondary | ICD-10-CM | POA: Diagnosis not present

## 2016-10-04 DIAGNOSIS — Z7982 Long term (current) use of aspirin: Secondary | ICD-10-CM | POA: Diagnosis not present

## 2016-10-04 DIAGNOSIS — Z720 Tobacco use: Secondary | ICD-10-CM | POA: Diagnosis not present

## 2016-10-04 DIAGNOSIS — I10 Essential (primary) hypertension: Secondary | ICD-10-CM | POA: Diagnosis not present

## 2016-10-04 DIAGNOSIS — R269 Unspecified abnormalities of gait and mobility: Secondary | ICD-10-CM | POA: Diagnosis not present

## 2016-10-04 DIAGNOSIS — M1990 Unspecified osteoarthritis, unspecified site: Secondary | ICD-10-CM | POA: Diagnosis not present

## 2016-10-04 DIAGNOSIS — Z96659 Presence of unspecified artificial knee joint: Secondary | ICD-10-CM | POA: Diagnosis not present

## 2016-10-05 DIAGNOSIS — M1711 Unilateral primary osteoarthritis, right knee: Secondary | ICD-10-CM | POA: Diagnosis not present

## 2016-10-05 DIAGNOSIS — F329 Major depressive disorder, single episode, unspecified: Secondary | ICD-10-CM | POA: Diagnosis not present

## 2016-10-05 DIAGNOSIS — E785 Hyperlipidemia, unspecified: Secondary | ICD-10-CM | POA: Diagnosis not present

## 2016-10-05 DIAGNOSIS — I10 Essential (primary) hypertension: Secondary | ICD-10-CM | POA: Diagnosis not present

## 2016-10-05 DIAGNOSIS — G4730 Sleep apnea, unspecified: Secondary | ICD-10-CM | POA: Diagnosis not present

## 2016-10-05 DIAGNOSIS — Z471 Aftercare following joint replacement surgery: Secondary | ICD-10-CM | POA: Diagnosis not present

## 2016-10-05 DIAGNOSIS — M1990 Unspecified osteoarthritis, unspecified site: Secondary | ICD-10-CM | POA: Diagnosis not present

## 2016-10-05 DIAGNOSIS — Z96659 Presence of unspecified artificial knee joint: Secondary | ICD-10-CM | POA: Diagnosis not present

## 2016-10-10 DIAGNOSIS — Z96651 Presence of right artificial knee joint: Secondary | ICD-10-CM | POA: Diagnosis not present

## 2016-10-10 DIAGNOSIS — D649 Anemia, unspecified: Secondary | ICD-10-CM | POA: Diagnosis not present

## 2016-10-10 DIAGNOSIS — R269 Unspecified abnormalities of gait and mobility: Secondary | ICD-10-CM | POA: Diagnosis not present

## 2016-10-10 DIAGNOSIS — I10 Essential (primary) hypertension: Secondary | ICD-10-CM | POA: Diagnosis not present

## 2016-10-10 DIAGNOSIS — Z471 Aftercare following joint replacement surgery: Secondary | ICD-10-CM | POA: Diagnosis not present

## 2016-10-10 DIAGNOSIS — R531 Weakness: Secondary | ICD-10-CM | POA: Diagnosis not present

## 2016-10-11 ENCOUNTER — Other Ambulatory Visit: Payer: Self-pay | Admitting: Physician Assistant

## 2016-10-11 DIAGNOSIS — E7800 Pure hypercholesterolemia, unspecified: Secondary | ICD-10-CM

## 2016-10-12 DIAGNOSIS — M25561 Pain in right knee: Secondary | ICD-10-CM | POA: Diagnosis not present

## 2016-10-12 DIAGNOSIS — M25562 Pain in left knee: Secondary | ICD-10-CM | POA: Diagnosis not present

## 2016-10-14 DIAGNOSIS — E785 Hyperlipidemia, unspecified: Secondary | ICD-10-CM | POA: Diagnosis not present

## 2016-10-14 DIAGNOSIS — I10 Essential (primary) hypertension: Secondary | ICD-10-CM | POA: Diagnosis not present

## 2016-10-14 DIAGNOSIS — Z471 Aftercare following joint replacement surgery: Secondary | ICD-10-CM | POA: Diagnosis not present

## 2016-10-14 DIAGNOSIS — F419 Anxiety disorder, unspecified: Secondary | ICD-10-CM | POA: Diagnosis not present

## 2016-10-14 DIAGNOSIS — Z96651 Presence of right artificial knee joint: Secondary | ICD-10-CM | POA: Diagnosis not present

## 2016-10-14 DIAGNOSIS — Z720 Tobacco use: Secondary | ICD-10-CM | POA: Diagnosis not present

## 2016-10-18 DIAGNOSIS — R262 Difficulty in walking, not elsewhere classified: Secondary | ICD-10-CM | POA: Diagnosis not present

## 2016-10-18 DIAGNOSIS — F17210 Nicotine dependence, cigarettes, uncomplicated: Secondary | ICD-10-CM | POA: Diagnosis not present

## 2016-10-18 DIAGNOSIS — Z471 Aftercare following joint replacement surgery: Secondary | ICD-10-CM | POA: Diagnosis not present

## 2016-10-18 DIAGNOSIS — I10 Essential (primary) hypertension: Secondary | ICD-10-CM | POA: Diagnosis not present

## 2016-10-18 DIAGNOSIS — Z96651 Presence of right artificial knee joint: Secondary | ICD-10-CM | POA: Diagnosis not present

## 2016-10-18 DIAGNOSIS — M6281 Muscle weakness (generalized): Secondary | ICD-10-CM | POA: Diagnosis not present

## 2016-10-18 DIAGNOSIS — E785 Hyperlipidemia, unspecified: Secondary | ICD-10-CM | POA: Diagnosis not present

## 2016-10-18 DIAGNOSIS — F329 Major depressive disorder, single episode, unspecified: Secondary | ICD-10-CM | POA: Diagnosis not present

## 2016-10-18 DIAGNOSIS — F419 Anxiety disorder, unspecified: Secondary | ICD-10-CM | POA: Diagnosis not present

## 2016-10-19 DIAGNOSIS — Z96651 Presence of right artificial knee joint: Secondary | ICD-10-CM | POA: Diagnosis not present

## 2016-10-19 DIAGNOSIS — M6281 Muscle weakness (generalized): Secondary | ICD-10-CM | POA: Diagnosis not present

## 2016-10-19 DIAGNOSIS — F419 Anxiety disorder, unspecified: Secondary | ICD-10-CM | POA: Diagnosis not present

## 2016-10-19 DIAGNOSIS — R262 Difficulty in walking, not elsewhere classified: Secondary | ICD-10-CM | POA: Diagnosis not present

## 2016-10-19 DIAGNOSIS — Z471 Aftercare following joint replacement surgery: Secondary | ICD-10-CM | POA: Diagnosis not present

## 2016-10-19 DIAGNOSIS — F17210 Nicotine dependence, cigarettes, uncomplicated: Secondary | ICD-10-CM | POA: Diagnosis not present

## 2016-10-19 DIAGNOSIS — E785 Hyperlipidemia, unspecified: Secondary | ICD-10-CM | POA: Diagnosis not present

## 2016-10-19 DIAGNOSIS — F329 Major depressive disorder, single episode, unspecified: Secondary | ICD-10-CM | POA: Diagnosis not present

## 2016-10-19 DIAGNOSIS — I10 Essential (primary) hypertension: Secondary | ICD-10-CM | POA: Diagnosis not present

## 2016-10-21 ENCOUNTER — Ambulatory Visit (INDEPENDENT_AMBULATORY_CARE_PROVIDER_SITE_OTHER): Payer: Medicare HMO | Admitting: Osteopathic Medicine

## 2016-10-21 ENCOUNTER — Encounter: Payer: Self-pay | Admitting: Osteopathic Medicine

## 2016-10-21 VITALS — BP 129/82 | HR 97 | Ht 70.5 in | Wt 214.0 lb

## 2016-10-21 DIAGNOSIS — D649 Anemia, unspecified: Secondary | ICD-10-CM

## 2016-10-21 DIAGNOSIS — Z96651 Presence of right artificial knee joint: Secondary | ICD-10-CM

## 2016-10-21 DIAGNOSIS — I10 Essential (primary) hypertension: Secondary | ICD-10-CM | POA: Diagnosis not present

## 2016-10-21 LAB — COMPLETE METABOLIC PANEL WITH GFR
ALBUMIN: 4.1 g/dL (ref 3.6–5.1)
ALK PHOS: 78 U/L (ref 40–115)
ALT: 4 U/L — AB (ref 9–46)
AST: 10 U/L (ref 10–35)
BUN: 15 mg/dL (ref 7–25)
CHLORIDE: 103 mmol/L (ref 98–110)
CO2: 25 mmol/L (ref 20–31)
CREATININE: 1.49 mg/dL — AB (ref 0.70–1.18)
Calcium: 9.2 mg/dL (ref 8.6–10.3)
GFR, Est African American: 53 mL/min — ABNORMAL LOW (ref >=60)
GFR, Est Non African American: 46 mL/min — ABNORMAL LOW (ref >=60)
GLUCOSE: 106 mg/dL — AB (ref 65–99)
POTASSIUM: 4.8 mmol/L (ref 3.5–5.3)
SODIUM: 140 mmol/L (ref 135–146)
Total Bilirubin: 0.4 mg/dL (ref 0.2–1.2)
Total Protein: 6.9 g/dL (ref 6.1–8.1)

## 2016-10-21 LAB — CBC WITH DIFFERENTIAL/PLATELET
BASOS PCT: 1 %
Basophils Absolute: 78 cells/uL (ref 0–200)
EOS PCT: 3 %
Eosinophils Absolute: 234 cells/uL (ref 15–500)
HCT: 34.7 % — ABNORMAL LOW (ref 38.5–50.0)
HEMOGLOBIN: 11.1 g/dL — AB (ref 13.2–17.1)
LYMPHS ABS: 2340 {cells}/uL (ref 850–3900)
Lymphocytes Relative: 30 %
MCH: 31 pg (ref 27.0–33.0)
MCHC: 32 g/dL (ref 32.0–36.0)
MCV: 96.9 fL (ref 80.0–100.0)
MONO ABS: 702 {cells}/uL (ref 200–950)
MPV: 10.5 fL (ref 7.5–12.5)
Monocytes Relative: 9 %
NEUTROS ABS: 4446 {cells}/uL (ref 1500–7800)
NEUTROS PCT: 57 %
Platelets: 314 10*3/uL (ref 140–400)
RBC: 3.58 MIL/uL — AB (ref 4.20–5.80)
RDW: 14.2 % (ref 11.0–15.0)
WBC: 7.8 10*3/uL (ref 3.8–10.8)

## 2016-10-21 LAB — RETICULOCYTES
ABS Retic: 75180 cells/uL (ref 25000–90000)
RBC.: 3.58 MIL/uL — AB (ref 4.20–5.80)
RETIC CT PCT: 2.1 %

## 2016-10-21 NOTE — Progress Notes (Signed)
HPI: Raymond Hardin is a 76 y.o. male  who presents to [facility] today, 10/21/16,  for chief complaint of:  Chief Complaint  Patient presents with  . Hospitalization Follow-up    KNEE SURGERY    Patient is here for hospital follow-up status post right knee replacement with Dr. Ballard Russell. Patient has no complaints, is following as directed with his orthopedic surgeon. Participating in physical therapy.   HTN: no CP/SOB, no home BP to report, compliant with medications as below.    Past medical, surgical, social and family history reviewed: Patient Active Problem List   Diagnosis Date Noted  . CKD (chronic kidney disease), stage III 12/07/2015  . Hyperlipidemia 02/24/2015  . Hypertriglyceridemia 02/24/2015  . Status post left knee replacement 08/13/2014  . Anxiety state 08/13/2014  . Essential hypertension, benign 08/13/2014  . Pre-diabetes 08/16/2013  . Vitamin D deficiency 08/13/2013  . Primary osteoarthritis of right knee 04/24/2013  . Tobacco abuse 12/12/2012  . Depression 12/12/2012  . Hypertension, essential, benign 11/04/2011  . Erectile dysfunction 11/04/2011   Past Surgical History:  Procedure Laterality Date  . left shoulder replacement  2011   Social History  Substance Use Topics  . Smoking status: Current Every Day Smoker    Packs/day: 0.50    Years: 25.00    Types: Cigarettes    Last attempt to quit: 08/10/2015  . Smokeless tobacco: Never Used  . Alcohol use No   Family History  Problem Relation Age of Onset  . Cancer Mother   . Suicidality Father 64  . Depression Father      Current medication list and allergy/intolerance information reviewed:   Current Outpatient Prescriptions  Medication Sig Dispense Refill  . ALPRAZolam (XANAX) 0.5 MG tablet Take 1 tablet (0.5 mg total) by mouth 2 (two) times daily as needed for anxiety. 45 tablet 5  . amLODipine (NORVASC) 10 MG tablet Take 1 tablet (10 mg total) by mouth daily. 30  tablet 5  . atorvastatin (LIPITOR) 20 MG tablet Take 1 tablet (20 mg total) by mouth daily. 90 tablet 1  . cholecalciferol (VITAMIN D) 1000 units tablet Take 1,000 Units by mouth daily.    . enalapril (VASOTEC) 20 MG tablet Take 1 tablet (20 mg total) by mouth 2 (two) times daily. 60 tablet 5  . sertraline (ZOLOFT) 100 MG tablet Take one and one/half tablet daily. 45 tablet 5  . sildenafil (REVATIO) 20 MG tablet TAKE FIVE TABLETS BY MOUTH AS NEEDED FOR ERECTILE DYSFUNCTION (FOR USE PRIOR TO SEXUAL ACTIVITY) 30 tablet 5   No current facility-administered medications for this visit.    No Known Allergies    Review of Systems:  Constitutional:  No recent illness, No unintentional weight changes. No significant fatigue.   Cardiac: No  chest pain, No  pressure, No palpitations  Respiratory:  No  shortness of breath.   Gastrointestinal: No  abdominal pain, No  nausea  Musculoskeletal: No new myalgia/arthralgia  Skin: No  Rash, No other wounds/concerning lesions  Neurologic: No  weakness, No  dizziness   Exam:  BP 129/82   Pulse 97   Ht 5' 10.5" (1.791 m)   Wt 214 lb (97.1 kg)   BMI 30.27 kg/m   Constitutional: VS see above. General Appearance: alert, well-developed, well-nourished, NAD  Eyes: Normal lids and conjunctive, non-icteric sclera  Ears, Nose, Mouth, Throat: MMM, Normal external inspection ears/nares/mouth/lips/gums.   Neck: No masses, trachea midline.  Respiratory: Normal respiratory effort. no wheeze,  no rhonchi, no rales  Cardiovascular: S1/S2 normal, no murmur, no rub/gallop auscultated. RRR. No lower extremity edema.  Musculoskeletal: Using walker. No clubbing/cyanosis of digits.    Neurological: Normal balance/coordination. No tremor.  Skin: warm, dry, intact - surgical scar appears to be healing appropriately, no erythema/drainage.    Records reviewed: Significant and anemia postop, hypocalcemia.  ASSESSMENT/PLAN:   Status post total right knee  replacement - Continue follow-up with orthopedics  Anemia, unspecified type - We'll recheck labs - Plan: CBC with Differential/Platelet, Reticulocytes  Hypertension, essential, benign - Blood pressure under good control - Plan: COMPLETE METABOLIC PANEL WITH GFR      Visit summary with medication list and pertinent instructions was printed for patient to review. All questions at time of visit were answered - patient instructed to contact office with any additional concerns. ER/RTC precautions were reviewed with the patient. Follow-up plan: Return in about 4 months (around 02/20/2017) for recheck blood pressure with Luvenia Starch, sooner if needed, keep appointment with Dr Pill .

## 2016-10-24 DIAGNOSIS — E785 Hyperlipidemia, unspecified: Secondary | ICD-10-CM | POA: Diagnosis not present

## 2016-10-24 DIAGNOSIS — Z96651 Presence of right artificial knee joint: Secondary | ICD-10-CM | POA: Diagnosis not present

## 2016-10-24 DIAGNOSIS — M6281 Muscle weakness (generalized): Secondary | ICD-10-CM | POA: Diagnosis not present

## 2016-10-24 DIAGNOSIS — F329 Major depressive disorder, single episode, unspecified: Secondary | ICD-10-CM | POA: Diagnosis not present

## 2016-10-24 DIAGNOSIS — I10 Essential (primary) hypertension: Secondary | ICD-10-CM | POA: Diagnosis not present

## 2016-10-24 DIAGNOSIS — Z471 Aftercare following joint replacement surgery: Secondary | ICD-10-CM | POA: Diagnosis not present

## 2016-10-24 DIAGNOSIS — F419 Anxiety disorder, unspecified: Secondary | ICD-10-CM | POA: Diagnosis not present

## 2016-10-24 DIAGNOSIS — R262 Difficulty in walking, not elsewhere classified: Secondary | ICD-10-CM | POA: Diagnosis not present

## 2016-10-24 DIAGNOSIS — F17210 Nicotine dependence, cigarettes, uncomplicated: Secondary | ICD-10-CM | POA: Diagnosis not present

## 2016-10-25 ENCOUNTER — Inpatient Hospital Stay: Payer: Medicare HMO | Admitting: Osteopathic Medicine

## 2016-10-25 DIAGNOSIS — E785 Hyperlipidemia, unspecified: Secondary | ICD-10-CM | POA: Diagnosis not present

## 2016-10-25 DIAGNOSIS — F17210 Nicotine dependence, cigarettes, uncomplicated: Secondary | ICD-10-CM | POA: Diagnosis not present

## 2016-10-25 DIAGNOSIS — Z96651 Presence of right artificial knee joint: Secondary | ICD-10-CM | POA: Diagnosis not present

## 2016-10-25 DIAGNOSIS — F419 Anxiety disorder, unspecified: Secondary | ICD-10-CM | POA: Diagnosis not present

## 2016-10-25 DIAGNOSIS — M6281 Muscle weakness (generalized): Secondary | ICD-10-CM | POA: Diagnosis not present

## 2016-10-25 DIAGNOSIS — Z471 Aftercare following joint replacement surgery: Secondary | ICD-10-CM | POA: Diagnosis not present

## 2016-10-25 DIAGNOSIS — I10 Essential (primary) hypertension: Secondary | ICD-10-CM | POA: Diagnosis not present

## 2016-10-25 DIAGNOSIS — F329 Major depressive disorder, single episode, unspecified: Secondary | ICD-10-CM | POA: Diagnosis not present

## 2016-10-25 DIAGNOSIS — R262 Difficulty in walking, not elsewhere classified: Secondary | ICD-10-CM | POA: Diagnosis not present

## 2016-10-31 DIAGNOSIS — E785 Hyperlipidemia, unspecified: Secondary | ICD-10-CM | POA: Diagnosis not present

## 2016-10-31 DIAGNOSIS — F17210 Nicotine dependence, cigarettes, uncomplicated: Secondary | ICD-10-CM | POA: Diagnosis not present

## 2016-10-31 DIAGNOSIS — M6281 Muscle weakness (generalized): Secondary | ICD-10-CM | POA: Diagnosis not present

## 2016-10-31 DIAGNOSIS — Z96651 Presence of right artificial knee joint: Secondary | ICD-10-CM | POA: Diagnosis not present

## 2016-10-31 DIAGNOSIS — F419 Anxiety disorder, unspecified: Secondary | ICD-10-CM | POA: Diagnosis not present

## 2016-10-31 DIAGNOSIS — I10 Essential (primary) hypertension: Secondary | ICD-10-CM | POA: Diagnosis not present

## 2016-10-31 DIAGNOSIS — Z471 Aftercare following joint replacement surgery: Secondary | ICD-10-CM | POA: Diagnosis not present

## 2016-10-31 DIAGNOSIS — R262 Difficulty in walking, not elsewhere classified: Secondary | ICD-10-CM | POA: Diagnosis not present

## 2016-10-31 DIAGNOSIS — F329 Major depressive disorder, single episode, unspecified: Secondary | ICD-10-CM | POA: Diagnosis not present

## 2016-11-02 DIAGNOSIS — M6281 Muscle weakness (generalized): Secondary | ICD-10-CM | POA: Diagnosis not present

## 2016-11-02 DIAGNOSIS — F17210 Nicotine dependence, cigarettes, uncomplicated: Secondary | ICD-10-CM | POA: Diagnosis not present

## 2016-11-02 DIAGNOSIS — F419 Anxiety disorder, unspecified: Secondary | ICD-10-CM | POA: Diagnosis not present

## 2016-11-02 DIAGNOSIS — F329 Major depressive disorder, single episode, unspecified: Secondary | ICD-10-CM | POA: Diagnosis not present

## 2016-11-02 DIAGNOSIS — I10 Essential (primary) hypertension: Secondary | ICD-10-CM | POA: Diagnosis not present

## 2016-11-02 DIAGNOSIS — E785 Hyperlipidemia, unspecified: Secondary | ICD-10-CM | POA: Diagnosis not present

## 2016-11-02 DIAGNOSIS — Z96651 Presence of right artificial knee joint: Secondary | ICD-10-CM | POA: Diagnosis not present

## 2016-11-02 DIAGNOSIS — Z471 Aftercare following joint replacement surgery: Secondary | ICD-10-CM | POA: Diagnosis not present

## 2016-11-02 DIAGNOSIS — R262 Difficulty in walking, not elsewhere classified: Secondary | ICD-10-CM | POA: Diagnosis not present

## 2016-11-08 DIAGNOSIS — F329 Major depressive disorder, single episode, unspecified: Secondary | ICD-10-CM | POA: Diagnosis not present

## 2016-11-08 DIAGNOSIS — I10 Essential (primary) hypertension: Secondary | ICD-10-CM | POA: Diagnosis not present

## 2016-11-08 DIAGNOSIS — Z96651 Presence of right artificial knee joint: Secondary | ICD-10-CM | POA: Diagnosis not present

## 2016-11-08 DIAGNOSIS — R262 Difficulty in walking, not elsewhere classified: Secondary | ICD-10-CM | POA: Diagnosis not present

## 2016-11-08 DIAGNOSIS — Z471 Aftercare following joint replacement surgery: Secondary | ICD-10-CM | POA: Diagnosis not present

## 2016-11-08 DIAGNOSIS — F17210 Nicotine dependence, cigarettes, uncomplicated: Secondary | ICD-10-CM | POA: Diagnosis not present

## 2016-11-08 DIAGNOSIS — M6281 Muscle weakness (generalized): Secondary | ICD-10-CM | POA: Diagnosis not present

## 2016-11-08 DIAGNOSIS — E785 Hyperlipidemia, unspecified: Secondary | ICD-10-CM | POA: Diagnosis not present

## 2016-11-08 DIAGNOSIS — F419 Anxiety disorder, unspecified: Secondary | ICD-10-CM | POA: Diagnosis not present

## 2016-11-11 DIAGNOSIS — E785 Hyperlipidemia, unspecified: Secondary | ICD-10-CM | POA: Diagnosis not present

## 2016-11-11 DIAGNOSIS — Z96651 Presence of right artificial knee joint: Secondary | ICD-10-CM | POA: Diagnosis not present

## 2016-11-11 DIAGNOSIS — M6281 Muscle weakness (generalized): Secondary | ICD-10-CM | POA: Diagnosis not present

## 2016-11-11 DIAGNOSIS — F419 Anxiety disorder, unspecified: Secondary | ICD-10-CM | POA: Diagnosis not present

## 2016-11-11 DIAGNOSIS — I10 Essential (primary) hypertension: Secondary | ICD-10-CM | POA: Diagnosis not present

## 2016-11-11 DIAGNOSIS — Z471 Aftercare following joint replacement surgery: Secondary | ICD-10-CM | POA: Diagnosis not present

## 2016-11-11 DIAGNOSIS — F329 Major depressive disorder, single episode, unspecified: Secondary | ICD-10-CM | POA: Diagnosis not present

## 2016-11-11 DIAGNOSIS — R262 Difficulty in walking, not elsewhere classified: Secondary | ICD-10-CM | POA: Diagnosis not present

## 2016-11-11 DIAGNOSIS — F17210 Nicotine dependence, cigarettes, uncomplicated: Secondary | ICD-10-CM | POA: Diagnosis not present

## 2016-11-14 ENCOUNTER — Other Ambulatory Visit: Payer: Self-pay | Admitting: Physician Assistant

## 2016-11-14 DIAGNOSIS — F411 Generalized anxiety disorder: Secondary | ICD-10-CM

## 2016-11-21 ENCOUNTER — Other Ambulatory Visit: Payer: Self-pay

## 2016-11-21 DIAGNOSIS — F32A Depression, unspecified: Secondary | ICD-10-CM

## 2016-11-21 DIAGNOSIS — I10 Essential (primary) hypertension: Secondary | ICD-10-CM

## 2016-11-21 DIAGNOSIS — E7800 Pure hypercholesterolemia, unspecified: Secondary | ICD-10-CM

## 2016-11-21 DIAGNOSIS — F329 Major depressive disorder, single episode, unspecified: Secondary | ICD-10-CM

## 2016-11-21 MED ORDER — ENALAPRIL MALEATE 20 MG PO TABS: 20.0000 mg | ORAL_TABLET | Freq: Two times a day (BID) | ORAL | 3 refills | Status: DC

## 2016-11-21 MED ORDER — ATORVASTATIN CALCIUM 20 MG PO TABS: 20.0000 mg | ORAL_TABLET | Freq: Every day | ORAL | 3 refills | Status: DC

## 2016-11-21 MED ORDER — SERTRALINE HCL 100 MG PO TABS: 150.0000 mg | ORAL_TABLET | Freq: Every day | ORAL | 3 refills | Status: DC

## 2016-11-21 NOTE — Progress Notes (Signed)
Years worth of refills per Dr Dianah Field

## 2016-11-29 ENCOUNTER — Other Ambulatory Visit: Payer: Self-pay | Admitting: *Deleted

## 2016-11-29 DIAGNOSIS — F32A Depression, unspecified: Secondary | ICD-10-CM

## 2016-11-29 DIAGNOSIS — I10 Essential (primary) hypertension: Secondary | ICD-10-CM

## 2016-11-29 DIAGNOSIS — E7800 Pure hypercholesterolemia, unspecified: Secondary | ICD-10-CM

## 2016-11-29 DIAGNOSIS — F329 Major depressive disorder, single episode, unspecified: Secondary | ICD-10-CM

## 2016-11-29 MED ORDER — ATORVASTATIN CALCIUM 20 MG PO TABS: 20.0000 mg | ORAL_TABLET | Freq: Every day | ORAL | 3 refills | Status: DC

## 2016-11-29 MED ORDER — ENALAPRIL MALEATE 20 MG PO TABS: 20.0000 mg | ORAL_TABLET | Freq: Two times a day (BID) | ORAL | 3 refills | Status: DC

## 2016-11-29 MED ORDER — SERTRALINE HCL 100 MG PO TABS: 150.0000 mg | ORAL_TABLET | Freq: Every day | ORAL | 3 refills | Status: DC

## 2016-12-01 ENCOUNTER — Other Ambulatory Visit: Payer: Self-pay

## 2016-12-01 DIAGNOSIS — F411 Generalized anxiety disorder: Secondary | ICD-10-CM

## 2016-12-01 MED ORDER — ALPRAZOLAM 0.5 MG PO TABS: 0.5000 mg | ORAL_TABLET | Freq: Two times a day (BID) | ORAL | 1 refills | Status: DC | PRN

## 2016-12-26 ENCOUNTER — Other Ambulatory Visit: Payer: Self-pay | Admitting: Physician Assistant

## 2016-12-26 DIAGNOSIS — F411 Generalized anxiety disorder: Secondary | ICD-10-CM

## 2017-01-17 ENCOUNTER — Telehealth: Payer: Self-pay | Admitting: Physician Assistant

## 2017-01-17 NOTE — Telephone Encounter (Signed)
Pt is requesting Refill Xanax

## 2017-01-17 NOTE — Telephone Encounter (Signed)
Patient needs an appointment and its too early to fill. Patient notified and transferred to scheduling to schedule a f/u

## 2017-01-19 ENCOUNTER — Other Ambulatory Visit: Payer: Self-pay | Admitting: Physician Assistant

## 2017-01-27 ENCOUNTER — Ambulatory Visit: Payer: Medicare HMO | Admitting: Physician Assistant

## 2017-01-27 DIAGNOSIS — Z0189 Encounter for other specified special examinations: Secondary | ICD-10-CM

## 2017-01-31 ENCOUNTER — Encounter: Payer: Self-pay | Admitting: Physician Assistant

## 2017-01-31 ENCOUNTER — Ambulatory Visit (INDEPENDENT_AMBULATORY_CARE_PROVIDER_SITE_OTHER): Payer: Medicare HMO | Admitting: Physician Assistant

## 2017-01-31 VITALS — BP 150/80 | HR 106 | Ht 70.5 in | Wt 211.0 lb

## 2017-01-31 DIAGNOSIS — F329 Major depressive disorder, single episode, unspecified: Secondary | ICD-10-CM

## 2017-01-31 DIAGNOSIS — F411 Generalized anxiety disorder: Secondary | ICD-10-CM | POA: Diagnosis not present

## 2017-01-31 DIAGNOSIS — Z96651 Presence of right artificial knee joint: Secondary | ICD-10-CM | POA: Insufficient documentation

## 2017-01-31 DIAGNOSIS — N529 Male erectile dysfunction, unspecified: Secondary | ICD-10-CM

## 2017-01-31 DIAGNOSIS — Z23 Encounter for immunization: Secondary | ICD-10-CM | POA: Diagnosis not present

## 2017-01-31 DIAGNOSIS — F32A Depression, unspecified: Secondary | ICD-10-CM

## 2017-01-31 MED ORDER — ALPRAZOLAM ER 1 MG PO TB24: 1.0000 mg | ORAL_TABLET | Freq: Every day | ORAL | 2 refills | Status: DC

## 2017-01-31 MED ORDER — SERTRALINE HCL 100 MG PO TABS: 200.0000 mg | ORAL_TABLET | Freq: Every day | ORAL | 5 refills | Status: DC

## 2017-01-31 MED ORDER — SILDENAFIL CITRATE 20 MG PO TABS
ORAL_TABLET | ORAL | 5 refills | Status: DC
Start: 2017-01-31 — End: 2017-02-01

## 2017-01-31 NOTE — Patient Instructions (Signed)
Will make referral to pain clinic.

## 2017-01-31 NOTE — Progress Notes (Deleted)
   Subjective:    Patient ID: Raymond Hardin, male    DOB: [birth year], 76 y.o.   MRN: [id number]  HPI June 2nd and January 16 [facility]   Review of Systems     Objective:   Physical Exam        Assessment & Plan:

## 2017-01-31 NOTE — Progress Notes (Addendum)
Subjective:    Patient ID: Raymond Hardin, male    DOB: [birth year], 76 y.o.   MRN: [id number]  HPI The patient is here today for a medication refill. His blood pressure today was 181/93. He states that he is taking his BP medication as directed. He denies chest pain, SOB, palpitations, or headaches.   He also reports increased anxiety and is requesting an increase in his Xanax. He has begun taking care of his sister and he reports that this has been anxiety inducing. When he becomes anxious he says that he leaves the house and walks in his backyard. He is not taking zoloft.   He requested a refill on his Sildenafil today. He would also like a referral for a pain clinic in [place] because of the continued pain after his right total knee replacement in June of this year.  His left was replaced in January 2016.   .. Active Ambulatory Problems    Diagnosis Date Noted  . Hypertension, essential, benign 11/04/2011  . Erectile dysfunction 11/04/2011  . Tobacco abuse 12/12/2012  . Depression 12/12/2012  . Primary osteoarthritis of right knee 04/24/2013  . Vitamin D deficiency 08/13/2013  . Pre-diabetes 08/16/2013  . Status post left knee replacement 08/13/2014  . Anxiety state 08/13/2014  . Essential hypertension, benign 08/13/2014  . Hyperlipidemia 02/24/2015  . Hypertriglyceridemia 02/24/2015  . CKD (chronic kidney disease), stage III 12/07/2015  . S/P total knee replacement, right 01/31/2017   Resolved Ambulatory Problems    Diagnosis Date Noted  . No Resolved Ambulatory Problems   Past Medical History:  Diagnosis Date  . Depression   . Hyperlipidemia   . Hypertension   . Insomnia 2005  . Osteoarthritis 2000      Review of Systems  All other systems reviewed and are negative.      Objective:   Physical Exam  Constitutional: He is oriented to person, place, and time. He appears well-developed and well-nourished.  Cardiovascular: Normal rate, regular rhythm and  normal heart sounds.   Pulmonary/Chest: Effort normal and breath sounds normal.  Neurological: He is alert and oriented to person, place, and time.  Psychiatric: He has a normal mood and affect. His behavior is normal.        Assessment & Plan:  Marland KitchenMarland KitchenJarrick was seen today for hypertension and anxiety.  Diagnoses and all orders for this visit:  Anxiety state -     sertraline (ZOLOFT) 100 MG tablet; Take 2 tablets (200 mg total) by mouth daily. -     ALPRAZolam (XANAX XR) 1 MG 24 hr tablet; Take 1 tablet (1 mg total) by mouth daily.  Depression, unspecified depression type -     sertraline (ZOLOFT) 100 MG tablet; Take 2 tablets (200 mg total) by mouth daily.  Erectile dysfunction, unspecified erectile dysfunction type -     sildenafil (REVATIO) 20 MG tablet; TAKE FIVE TABLETS BY MOUTH AS NEEDED FOR ERECTILE DYSFUNCTION (FOR USE PRIOR TO SEXUAL ACTIVITY)  Influenza vaccine needed -     Flu Vaccine QUAD 6+ mos PF IM (Fluarix Quad PF)  S/P total knee replacement, right -     Ambulatory referral to Pain Clinic   .Marland Kitchen GAD 7 : Generalized Anxiety Score 01/31/2017 01/31/2017 07/11/2016  Nervous, Anxious, on Edge 3 3 2   Control/stop worrying 3 3 2   Worry too much - different things 2 2 2   Trouble relaxing 2 2 1   Restless 3 3 1   Easily annoyed or irritable 2 2 2  Afraid - awful might happen 1 1 0  Total GAD 7 Score 16 16 10     Pt MUST start zoloft. If he is been off start with 1 tablet and increase after 2 weeks to 2 tablets.  D/C xanax bid and started Xanax ER. Discussed risk of sedation and abuse. Only take when needed. Follow up in 2 months.   HTN- at 3rd check under 150/90 no adjustments made today. Will closely follow. Pt is asymptomatic.   We will not prescribe pain medication. Will make pain referral.

## 2017-01-31 NOTE — Addendum Note (Signed)
Addended by: Donella Stade on: 01/31/2017 05:20 PM   Modules accepted: Orders, Level of Service

## 2017-02-01 MED ORDER — SILDENAFIL CITRATE 20 MG PO TABS: ORAL_TABLET | ORAL | 5 refills | Status: DC

## 2017-02-01 MED ORDER — SERTRALINE HCL 100 MG PO TABS: 150.0000 mg | ORAL_TABLET | Freq: Every day | ORAL | 3 refills | Status: DC

## 2017-02-01 NOTE — Addendum Note (Signed)
Addended by: Narda Rutherford on: 02/01/2017 07:39 AM   Modules accepted: Orders

## 2017-02-02 ENCOUNTER — Other Ambulatory Visit: Payer: Self-pay

## 2017-02-02 DIAGNOSIS — F411 Generalized anxiety disorder: Secondary | ICD-10-CM

## 2017-02-02 NOTE — Telephone Encounter (Signed)
Pre Authorization was sent to [employer]. PA Case ID: [id number] - Rx #: [id number]

## 2017-02-09 ENCOUNTER — Telehealth: Payer: Self-pay | Admitting: *Deleted

## 2017-02-09 NOTE — Telephone Encounter (Signed)
Pt left vm wanting you to know that he has an appointment with pain management on 10/23 at 2:00.  He wants to know if you will write him "oxys" to get him to his appointment.  Please advise.

## 2017-02-10 ENCOUNTER — Other Ambulatory Visit: Payer: Self-pay

## 2017-02-10 ENCOUNTER — Other Ambulatory Visit: Payer: Self-pay | Admitting: Physician Assistant

## 2017-02-10 ENCOUNTER — Telehealth: Payer: Self-pay

## 2017-02-10 MED ORDER — OXYCODONE HCL 5 MG PO TABS: 5.0000 mg | ORAL_TABLET | ORAL | 0 refills | Status: DC | PRN

## 2017-02-10 NOTE — Telephone Encounter (Signed)
I will give 30 tablets to last until that appt. Please pick up today.

## 2017-02-10 NOTE — Telephone Encounter (Signed)
LVM requesting pt to call the office.  

## 2017-02-10 NOTE — Telephone Encounter (Signed)
Pt called and wanted to know if he can get a refill of oxycodone HCI 5 mg for his knee pain from surgery. Pt stated that he has an appt in [place] with a pain clinic but it is not until the 23rd of Oct. Please advise?

## 2017-02-10 NOTE — Telephone Encounter (Signed)
See phone note to [employer]

## 2017-02-10 NOTE — Telephone Encounter (Signed)
Pt informed. Pt expressed understanding and is agreeable. Candus Braud CMA, RT 

## 2017-02-13 ENCOUNTER — Other Ambulatory Visit: Payer: Self-pay | Admitting: Physician Assistant

## 2017-02-15 NOTE — Telephone Encounter (Signed)
It did not last long enough and I did not want to prescribe more than once a day we thought the XR would stay in system longer and only take once a day.

## 2017-02-15 NOTE — Telephone Encounter (Signed)
Alprazolam ER denied Before the drug can be covered [employer] needs to know why preferred drug didn't work . Preferred are lorazepam tablet,diazepam tablet,bupirone tablet

## 2017-02-16 ENCOUNTER — Encounter: Payer: Self-pay | Admitting: *Deleted

## 2017-02-16 NOTE — Telephone Encounter (Signed)
Ok to go back to what he was taking before. Just let patient know insurance will not pay for extended release. It is not advised to take with pain medication anyways. Discussed not to take together and to take no more than once a day #30

## 2017-02-16 NOTE — Telephone Encounter (Addendum)
What didn't last long enough, the regular xanax 0.5 bid? I can try an appeal but they still may not cover it since he has not tried and failed the preferred meds. Most likely it will be denied

## 2017-02-17 MED ORDER — BUSPIRONE HCL 5 MG PO TABS: 5.0000 mg | ORAL_TABLET | Freq: Three times a day (TID) | ORAL | 3 refills | Status: DC

## 2017-02-17 NOTE — Telephone Encounter (Signed)
Dr. Dianah Field since Luvenia Starch is not here in the office for the next week and she ok'd xanax for this patient are you ok signing the rx so that it can be faxed?

## 2017-02-17 NOTE — Addendum Note (Signed)
Addended by: Terance Hart on: 02/17/2017 04:06 PM   Modules accepted: Orders

## 2017-02-17 NOTE — Telephone Encounter (Signed)
Switching to Buspar low dose, have him try it and give it the due diligence of titrating up monthly to the maximum dose.

## 2017-02-17 NOTE — Telephone Encounter (Signed)
Patient notified

## 2017-02-17 NOTE — Addendum Note (Signed)
Addended by: Silverio Decamp on: 02/17/2017 04:09 PM   Modules accepted: Orders

## 2017-02-20 ENCOUNTER — Ambulatory Visit: Payer: Medicare HMO | Admitting: Physician Assistant

## 2017-02-27 ENCOUNTER — Ambulatory Visit: Payer: Medicare HMO | Admitting: Physician Assistant

## 2017-03-02 ENCOUNTER — Telehealth: Payer: Self-pay | Admitting: *Deleted

## 2017-03-02 NOTE — Telephone Encounter (Signed)
(  see phone note 9/27. Oddly you cannot see it unless you click on the diagnosis anxiety under the snapshot. There is an ongoing phone there.) an appeal was submitted for the long acting xanax and was approved however the patient was switched over to Buspar bu Dr. Abbie Sons you like for the patient to continue to try buspar of now try xanax xr. Xanax xr approved from 02/17/2017-05/08/2018. Letter sent to scan. Reference # is [id number]

## 2017-03-13 ENCOUNTER — Other Ambulatory Visit: Payer: Self-pay | Admitting: Physician Assistant

## 2017-03-28 ENCOUNTER — Encounter: Payer: Self-pay | Admitting: Physician Assistant

## 2017-03-28 ENCOUNTER — Ambulatory Visit (INDEPENDENT_AMBULATORY_CARE_PROVIDER_SITE_OTHER): Payer: Medicare HMO | Admitting: Physician Assistant

## 2017-03-28 VITALS — BP 132/88 | HR 108 | Ht 70.5 in | Wt 214.0 lb

## 2017-03-28 DIAGNOSIS — R7303 Prediabetes: Secondary | ICD-10-CM

## 2017-03-28 DIAGNOSIS — I10 Essential (primary) hypertension: Secondary | ICD-10-CM

## 2017-03-28 DIAGNOSIS — E7800 Pure hypercholesterolemia, unspecified: Secondary | ICD-10-CM

## 2017-03-28 DIAGNOSIS — F411 Generalized anxiety disorder: Secondary | ICD-10-CM

## 2017-03-28 DIAGNOSIS — Z96651 Presence of right artificial knee joint: Secondary | ICD-10-CM

## 2017-03-28 DIAGNOSIS — N183 Chronic kidney disease, stage 3 unspecified: Secondary | ICD-10-CM

## 2017-03-28 DIAGNOSIS — F32A Depression, unspecified: Secondary | ICD-10-CM

## 2017-03-28 DIAGNOSIS — F329 Major depressive disorder, single episode, unspecified: Secondary | ICD-10-CM

## 2017-03-28 MED ORDER — ALPRAZOLAM 0.5 MG PO TABS: 0.5000 mg | ORAL_TABLET | Freq: Every evening | ORAL | 2 refills | Status: DC | PRN

## 2017-03-28 NOTE — Patient Instructions (Signed)
Start baby ASA daily 81mg .

## 2017-03-28 NOTE — Progress Notes (Signed)
Subjective:    Patient ID: Raymond Hardin, male    DOB: [birth year], 76 y.o.   MRN: [id number]  HPI  Pt is a 76 yo male with HTN, Depression, Anxiety, pre-diabetes, S/P right TKR that presents to the clinic for medication refills and to discuss anxiety.  His main concern is anxiety and depression. He is on zoloft. He does not have any xanax. He admits he has not started buspar. His sister broke her collar bone and left hip recently and he is trying to take care of house and her. This leaves him feeling overwhelmed. Previously we had given him a few oxycodones for ongoing pain of right knee after knee replacement in may and took xanax away. He is not currently on oxycodone. He has pain clinic appt on 11/27. He does not use NSAIDs due to CKD.  HTN- not problems or concerns. Denies any CP, palpitations, headaches or vision changes.   Pt has pre-diabetes and elevated cholesterol he is due for recheck.   .. Active Ambulatory Problems    Diagnosis Date Noted  . Hypertension, essential, benign 11/04/2011  . Erectile dysfunction 11/04/2011  . Tobacco abuse 12/12/2012  . Depression 12/12/2012  . Primary osteoarthritis of right knee 04/24/2013  . Vitamin D deficiency 08/13/2013  . Pre-diabetes 08/16/2013  . Status post left knee replacement 08/13/2014  . Anxiety state 08/13/2014  . Essential hypertension, benign 08/13/2014  . Hyperlipidemia 02/24/2015  . Hypertriglyceridemia 02/24/2015  . CKD (chronic kidney disease), stage III ([place]) 12/07/2015  . S/P total knee replacement, right 01/31/2017   Resolved Ambulatory Problems    Diagnosis Date Noted  . No Resolved Ambulatory Problems   Past Medical History:  Diagnosis Date  . Depression   . Hyperlipidemia   . Hypertension   . Insomnia 2005  . Osteoarthritis 2000      .    Review of Systems  All other systems reviewed and are negative.      Objective:   Physical Exam  Constitutional: He is oriented to person, place, and  time. He appears well-developed and well-nourished.  HENT:  Head: Normocephalic and atraumatic.  Cardiovascular: Normal rate, regular rhythm and normal heart sounds.  Pulmonary/Chest: Effort normal and breath sounds normal.  Neurological: He is alert and oriented to person, place, and time.  Psychiatric: He has a normal mood and affect. His behavior is normal.          Assessment & Plan:  Marland KitchenMarland KitchenJanet was seen today for depression.  Diagnoses and all orders for this visit:  Essential hypertension, benign -     COMPLETE METABOLIC PANEL WITH GFR  CKD (chronic kidney disease), stage III ([facility]) -     COMPLETE METABOLIC PANEL WITH GFR  Depression, unspecified depression type  S/P total knee replacement, right  Anxiety state -     ALPRAZolam (XANAX) 0.5 MG tablet; Take 1 tablet (0.5 mg total) by mouth at bedtime as needed for anxiety.  Pure hypercholesterolemia -     Lipid Panel w/reflex Direct LDL  Pre-diabetes -     Hemoglobin A1c   .Marland Kitchen Depression screen [facility] 2/9 03/28/2017 07/25/2016 07/11/2016 05/29/2013 05/29/2013  Decreased Interest 2 1 2  0 0  Down, Depressed, Hopeless 3 1 2  0 0  PHQ - 2 Score 5 2 4  0 0  Altered sleeping 2 0 0 0 1  Tired, decreased energy 3 1 2 1  0  Change in appetite 2 1 2  0 1  Feeling bad or failure about yourself  1 0 0 1 0  Trouble concentrating 1 0 0 0 0  Moving slowly or fidgety/restless 3 1 0 0 0  Suicidal thoughts 3 0 0 0 0  PHQ-9 Score 20 5 8 2 2     Long discussion about xanax and how that was not "fixing" his problem. Encouraged to start buspar and use xanax sparingly. Continue on zoloft and increase to 200mg  daily. Follow up in 2 months.   Discussed I was not going to prescribe long term pain medication and to keep pain clinic appt. Also discussed he did not need to be on xanax and opiates and he needed to choose between.   Recheck A!C and lipid.  Reminder to start daily ASA 81mg .  On ACE/STATin.

## 2017-03-29 LAB — COMPLETE METABOLIC PANEL WITH GFR
AG Ratio: 1.6 (calc) (ref 1.0–2.5)
ALKALINE PHOSPHATASE (APISO): 42 U/L (ref 40–115)
ALT: 7 U/L — AB (ref 9–46)
AST: 16 U/L (ref 10–35)
Albumin: 4.3 g/dL (ref 3.6–5.1)
BUN: 11 mg/dL (ref 7–25)
CALCIUM: 9 mg/dL (ref 8.6–10.3)
CO2: 26 mmol/L (ref 20–32)
CREATININE: 1.18 mg/dL (ref 0.70–1.18)
Chloride: 102 mmol/L (ref 98–110)
GFR, EST NON AFRICAN AMERICAN: 60 mL/min/{1.73_m2} (ref >=60)
GFR, Est African American: 70 mL/min/{1.73_m2} (ref >=60)
GLOBULIN: 2.7 g/dL (ref 1.9–3.7)
GLUCOSE: 91 mg/dL (ref 65–99)
Potassium: 4.2 mmol/L (ref 3.5–5.3)
SODIUM: 140 mmol/L (ref 135–146)
Total Bilirubin: 0.6 mg/dL (ref 0.2–1.2)
Total Protein: 7 g/dL (ref 6.1–8.1)

## 2017-03-29 LAB — LIPID PANEL W/REFLEX DIRECT LDL
CHOL/HDL RATIO: 3.5 (calc) (ref <5.0)
CHOLESTEROL: 158 mg/dL (ref <200)
HDL: 45 mg/dL (ref >40)
LDL Cholesterol (Calc): 87 mg/dL (calc)
Non-HDL Cholesterol (Calc): 113 mg/dL (calc) (ref <130)
Triglycerides: 155 mg/dL — ABNORMAL HIGH (ref <150)

## 2017-03-29 LAB — HEMOGLOBIN A1C
HEMOGLOBIN A1C: 6 %{Hb} — AB (ref <5.7)
Mean Plasma Glucose: 126 (calc)
eAG (mmol/L): 7 (calc)

## 2017-05-03 ENCOUNTER — Other Ambulatory Visit: Payer: Self-pay | Admitting: Physician Assistant

## 2017-05-03 DIAGNOSIS — F411 Generalized anxiety disorder: Secondary | ICD-10-CM

## 2017-05-03 NOTE — Telephone Encounter (Signed)
On 11/20 I sent 20 tablets with 2 refills. I am wanting patient to use sparingly. He needs to follow up after the 2 refills.   Please call patient and let him know this.

## 2017-05-05 ENCOUNTER — Telehealth: Payer: Self-pay | Admitting: Physician Assistant

## 2017-05-05 NOTE — Telephone Encounter (Signed)
Pt called and stated that he spoke with his insurance and they stated it would be cheaper for him to get a 3 month supply of his xanax at a time then what he is currently getting. So he was wondering if that can be done. Thanks

## 2017-05-05 NOTE — Telephone Encounter (Signed)
PATIENT HAS BEEN INFORMED. Rhonda Cunningham,CMA  

## 2017-05-15 ENCOUNTER — Other Ambulatory Visit: Payer: Self-pay | Admitting: Physician Assistant

## 2017-05-15 NOTE — Telephone Encounter (Signed)
Ok to do but he will then only get 60 for a 3 month period.

## 2017-05-19 ENCOUNTER — Other Ambulatory Visit: Payer: Self-pay | Admitting: Physician Assistant

## 2017-05-19 DIAGNOSIS — F411 Generalized anxiety disorder: Secondary | ICD-10-CM

## 2017-05-22 ENCOUNTER — Telehealth: Payer: Self-pay | Admitting: *Deleted

## 2017-05-22 DIAGNOSIS — F411 Generalized anxiety disorder: Secondary | ICD-10-CM

## 2017-05-22 MED ORDER — ALPRAZOLAM 0.5 MG PO TABS: 0.5000 mg | ORAL_TABLET | Freq: Every evening | ORAL | 2 refills | Status: DC | PRN

## 2017-05-22 NOTE — Telephone Encounter (Signed)
Pt notified of instructions

## 2017-05-22 NOTE — Telephone Encounter (Signed)
Call pt: last refill was 12/26 he is not due until a day or so before that.

## 2017-05-22 NOTE — Telephone Encounter (Signed)
Sent refill for 20 tablets AS NEEDED. I do not want to give 90 days supply worried patient cannot use them up way before next rx is due.

## 2017-05-22 NOTE — Telephone Encounter (Signed)
Pt has been calling multiple times a day in reference to his xanax refill.  I spoke with him and he admitted that he takes some extra.  His last refill was on 12/26 and that was the LAST of the 2 refills written in November, he should still have one refill left for January.  I advised him that he is not to be taking these everyday let alone more than one everyday.  Please advise.

## 2017-05-29 DIAGNOSIS — Z5181 Encounter for therapeutic drug level monitoring: Secondary | ICD-10-CM | POA: Diagnosis not present

## 2017-05-29 DIAGNOSIS — I10 Essential (primary) hypertension: Secondary | ICD-10-CM | POA: Diagnosis not present

## 2017-05-29 DIAGNOSIS — Z96653 Presence of artificial knee joint, bilateral: Secondary | ICD-10-CM | POA: Diagnosis not present

## 2017-05-29 DIAGNOSIS — G8929 Other chronic pain: Secondary | ICD-10-CM | POA: Diagnosis not present

## 2017-05-29 DIAGNOSIS — G894 Chronic pain syndrome: Secondary | ICD-10-CM | POA: Diagnosis not present

## 2017-05-29 DIAGNOSIS — M25561 Pain in right knee: Secondary | ICD-10-CM | POA: Diagnosis not present

## 2017-05-29 DIAGNOSIS — Z79899 Other long term (current) drug therapy: Secondary | ICD-10-CM | POA: Diagnosis not present

## 2017-06-05 NOTE — Telephone Encounter (Signed)
Pt calls and wanted to know why the pharmacy will not refill his Xanax until 06/22/2017. Tried to call pt back no answer. KG LPN

## 2017-06-28 ENCOUNTER — Encounter: Payer: Self-pay | Admitting: Physician Assistant

## 2017-06-28 ENCOUNTER — Ambulatory Visit (INDEPENDENT_AMBULATORY_CARE_PROVIDER_SITE_OTHER): Payer: Medicare HMO | Admitting: Physician Assistant

## 2017-06-28 VITALS — BP 142/72 | HR 78 | Ht 70.5 in | Wt 214.0 lb

## 2017-06-28 DIAGNOSIS — Z96651 Presence of right artificial knee joint: Secondary | ICD-10-CM | POA: Diagnosis not present

## 2017-06-28 DIAGNOSIS — F329 Major depressive disorder, single episode, unspecified: Secondary | ICD-10-CM

## 2017-06-28 DIAGNOSIS — G8929 Other chronic pain: Secondary | ICD-10-CM | POA: Diagnosis not present

## 2017-06-28 DIAGNOSIS — F32A Depression, unspecified: Secondary | ICD-10-CM

## 2017-06-28 DIAGNOSIS — M25569 Pain in unspecified knee: Secondary | ICD-10-CM

## 2017-06-28 DIAGNOSIS — Z96652 Presence of left artificial knee joint: Secondary | ICD-10-CM | POA: Diagnosis not present

## 2017-06-28 DIAGNOSIS — F419 Anxiety disorder, unspecified: Secondary | ICD-10-CM | POA: Diagnosis not present

## 2017-06-28 DIAGNOSIS — I10 Essential (primary) hypertension: Secondary | ICD-10-CM

## 2017-06-28 MED ORDER — AMLODIPINE BESYLATE 10 MG PO TABS: 10.0000 mg | ORAL_TABLET | Freq: Every day | ORAL | 1 refills | Status: DC

## 2017-06-28 MED ORDER — ENALAPRIL MALEATE 20 MG PO TABS: 20.0000 mg | ORAL_TABLET | Freq: Two times a day (BID) | ORAL | 1 refills | Status: DC

## 2017-06-28 MED ORDER — BUSPIRONE HCL 10 MG PO TABS: 10.0000 mg | ORAL_TABLET | Freq: Three times a day (TID) | ORAL | 0 refills | Status: DC

## 2017-06-28 NOTE — Progress Notes (Signed)
Subjective:    Patient ID: Raymond Hardin, male    DOB: January 01, 1941, 77 y.o.   MRN: [id number]  HPI Patient is a 77 year old male with hypertension, anxiety, depression who presents to the clinic for medication follow-up.  If you look over the last few months using lots of communication about Xanax.  Patient feels like this is the only thing that helps him with his anxiety.  He is also being seen at the pain clinic for ongoing knee pain despite having a knee replacement.  He reports a lot of things going on at home.  He is being kicked out of his home since his sister was moved to a nursing home.  He feels like life is very stressful at this time.  He is taking Zoloft for his depression.  He has been on Zoloft for years and tolerated well.  We did add BuSpar at last visit which he states helped some.  I have been giving him 20 Xanax a month to use as needed. He states he is not using every day but then he calls before the month ends needing refill.  Slightly patient does admit he feels like he just be better off dead.  He denies making a plan.  He does feel like maybe once he gets settled in his new place it will make a difference.  Hypertension-patient denies any chest pains, headaches, vision changes.  He is taking his medication daily.  .. Active Ambulatory Problems    Diagnosis Date Noted  . Hypertension, essential, benign 11/04/2011  . Erectile dysfunction 11/04/2011  . Tobacco abuse 12/12/2012  . Depression 12/12/2012  . Primary osteoarthritis of right knee 04/24/2013  . Vitamin D deficiency 08/13/2013  . Pre-diabetes 08/16/2013  . Status post left knee replacement 08/13/2014  . Anxiety state 08/13/2014  . Essential hypertension, benign 08/13/2014  . Hyperlipidemia 02/24/2015  . Hypertriglyceridemia 02/24/2015  . CKD (chronic kidney disease), stage III ([place]) 12/07/2015  . S/P total knee replacement, right 01/31/2017   Resolved Ambulatory Problems    Diagnosis Date Noted  . No  Resolved Ambulatory Problems   Past Medical History:  Diagnosis Date  . Depression   . Hyperlipidemia   . Hypertension   . Insomnia 2005  . Osteoarthritis 2000      Review of Systems  All other systems reviewed and are negative.      Objective:   Physical Exam  Constitutional: He is oriented to person, place, and time. He appears well-developed and well-nourished.  overweight  HENT:  Head: Normocephalic and atraumatic.  Cardiovascular: Normal rate, regular rhythm and normal heart sounds.  Pulmonary/Chest: Effort normal and breath sounds normal.  Neurological: He is alert and oriented to person, place, and time.  Skin: Skin is dry. No rash noted.  Psychiatric: He has a normal mood and affect. His behavior is normal.          Assessment & Plan:  Marland KitchenMarland KitchenBlayne was seen today for anxiety and depression.  Diagnoses and all orders for this visit:  Hypertension, essential, benign -     enalapril (VASOTEC) 20 MG tablet; Take 1 tablet (20 mg total) by mouth 2 (two) times daily. -     amLODipine (NORVASC) 10 MG tablet; Take 1 tablet (10 mg total) by mouth daily.  Depression, unspecified depression type  Anxiety -     busPIRone (BUSPAR) 10 MG tablet; Take 1 tablet (10 mg total) by mouth 3 (three) times daily.     .. Depression screen  [facility] 2/9 03/28/2017 07/25/2016 07/11/2016 05/29/2013 05/29/2013  Decreased Interest 2 1 2  0 0  Down, Depressed, Hopeless 3 1 2  0 0  PHQ - 2 Score 5 2 4  0 0  Altered sleeping 2 0 0 0 1  Tired, decreased energy 3 1 2 1  0  Change in appetite 2 1 2  0 1  Feeling bad or failure about yourself  1 0 0 1 0  Trouble concentrating 1 0 0 0 0  Moving slowly or fidgety/restless 3 1 0 0 0  Suicidal thoughts 3 0 0 0 0  PHQ-9 Score 20 5 8 2 2    .. GAD 7 : Generalized Anxiety Score 06/28/2017 01/31/2017 01/31/2017 07/11/2016  Nervous, Anxious, on Edge 2 3 3 2   Control/stop worrying 2 3 3 2   Worry too much - different things 3 2 2 2   Trouble relaxing 2 2 2 1    Restless 2 3 3 1   Easily annoyed or irritable 2 2 2 2   Afraid - awful might happen 2 1 1  0  Total GAD 7 Score 15 16 16 10    Patient is over the age of 69 and BP under 150/90 without DM or CKD. Refilled medication today.   Pt has at least one refill left of xanax 20 tablets. Use that and then will send up to 20 a month. Discussed BEER's list and abuse potential. Continue on zoloft. Increased buspar. Pt does not have a plan although it does worry me with marking 3 on SI thoughts. Discussed plan if he had worsening thoughts. Hotline number given. Discussed options for living. This is stressing him out. Follow up in 1 month.   Pt sees pain clinic. He is scheduled to have Genicular blockage injection in future. Hopefully this will given him some pain relief.   Marland Kitchen.Spent 30 minutes with patient and greater than 50 percent of visit spent counseling patient regarding treatment plan.

## 2017-06-29 ENCOUNTER — Encounter: Payer: Self-pay | Admitting: Physician Assistant

## 2017-07-10 DIAGNOSIS — M1711 Unilateral primary osteoarthritis, right knee: Secondary | ICD-10-CM | POA: Diagnosis not present

## 2017-07-18 ENCOUNTER — Telehealth: Payer: Self-pay

## 2017-07-18 NOTE — Telephone Encounter (Signed)
Raymond Hardin called and states he wants an early refill on Xanax. He states he has a lot going on. His sister is a home and he is having to pay her bills. He would like to take the xanax daily. Please advise.

## 2017-07-18 NOTE — Telephone Encounter (Signed)
Ok for early refill last refill given on 05/22/17, 06/22/17 2 days early.

## 2017-07-18 NOTE — Telephone Encounter (Signed)
Advised pharmacy to fill early. Patient advised.

## 2017-07-20 ENCOUNTER — Other Ambulatory Visit: Payer: Self-pay | Admitting: Physician Assistant

## 2017-07-20 DIAGNOSIS — I10 Essential (primary) hypertension: Secondary | ICD-10-CM

## 2017-08-14 DIAGNOSIS — M1711 Unilateral primary osteoarthritis, right knee: Secondary | ICD-10-CM | POA: Diagnosis not present

## 2017-08-18 ENCOUNTER — Other Ambulatory Visit: Payer: Self-pay | Admitting: Physician Assistant

## 2017-08-18 DIAGNOSIS — F411 Generalized anxiety disorder: Secondary | ICD-10-CM

## 2017-08-21 ENCOUNTER — Other Ambulatory Visit: Payer: Self-pay | Admitting: Physician Assistant

## 2017-08-21 DIAGNOSIS — F411 Generalized anxiety disorder: Secondary | ICD-10-CM

## 2017-08-21 NOTE — Telephone Encounter (Signed)
[place] requesting RF on pt's Xanax.   Last RX sent 05-22-17 for #20 with 2 RFs.  Please review and send RF is appropriate.   Thanks!

## 2017-10-18 ENCOUNTER — Other Ambulatory Visit: Payer: Self-pay | Admitting: *Deleted

## 2017-10-18 DIAGNOSIS — E7800 Pure hypercholesterolemia, unspecified: Secondary | ICD-10-CM

## 2017-10-18 MED ORDER — ATORVASTATIN CALCIUM 20 MG PO TABS: 20.0000 mg | ORAL_TABLET | Freq: Every day | ORAL | 0 refills | Status: DC

## 2017-11-17 ENCOUNTER — Other Ambulatory Visit: Payer: Self-pay

## 2017-11-17 DIAGNOSIS — F411 Generalized anxiety disorder: Secondary | ICD-10-CM

## 2017-11-17 DIAGNOSIS — N529 Male erectile dysfunction, unspecified: Secondary | ICD-10-CM

## 2017-11-17 MED ORDER — SILDENAFIL CITRATE 20 MG PO TABS: ORAL_TABLET | ORAL | 5 refills | Status: DC

## 2017-11-17 MED ORDER — ALPRAZOLAM 0.5 MG PO TABS: 0.5000 mg | ORAL_TABLET | Freq: Every evening | ORAL | 1 refills | Status: DC | PRN

## 2017-11-17 NOTE — Telephone Encounter (Signed)
One is controlled. -[place]

## 2017-11-17 NOTE — Telephone Encounter (Signed)
Controlled substance. -[place]

## 2017-12-04 DIAGNOSIS — M25562 Pain in left knee: Secondary | ICD-10-CM | POA: Diagnosis not present

## 2017-12-04 DIAGNOSIS — M25561 Pain in right knee: Secondary | ICD-10-CM | POA: Diagnosis not present

## 2017-12-26 ENCOUNTER — Ambulatory Visit: Payer: Medicare HMO | Admitting: Physician Assistant

## 2018-01-04 DIAGNOSIS — Z0100 Encounter for examination of eyes and vision without abnormal findings: Secondary | ICD-10-CM | POA: Diagnosis not present

## 2018-01-04 DIAGNOSIS — I10 Essential (primary) hypertension: Secondary | ICD-10-CM | POA: Diagnosis not present

## 2018-01-05 ENCOUNTER — Encounter: Payer: Self-pay | Admitting: Physician Assistant

## 2018-01-05 ENCOUNTER — Ambulatory Visit (INDEPENDENT_AMBULATORY_CARE_PROVIDER_SITE_OTHER): Payer: Medicare HMO | Admitting: Physician Assistant

## 2018-01-05 VITALS — BP 136/70 | HR 84 | Ht 70.5 in | Wt 222.0 lb

## 2018-01-05 DIAGNOSIS — F411 Generalized anxiety disorder: Secondary | ICD-10-CM

## 2018-01-05 DIAGNOSIS — Z131 Encounter for screening for diabetes mellitus: Secondary | ICD-10-CM

## 2018-01-05 DIAGNOSIS — F329 Major depressive disorder, single episode, unspecified: Secondary | ICD-10-CM | POA: Diagnosis not present

## 2018-01-05 DIAGNOSIS — N183 Chronic kidney disease, stage 3 unspecified: Secondary | ICD-10-CM

## 2018-01-05 DIAGNOSIS — H259 Unspecified age-related cataract: Secondary | ICD-10-CM

## 2018-01-05 DIAGNOSIS — I10 Essential (primary) hypertension: Secondary | ICD-10-CM | POA: Diagnosis not present

## 2018-01-05 DIAGNOSIS — H269 Unspecified cataract: Secondary | ICD-10-CM | POA: Insufficient documentation

## 2018-01-05 DIAGNOSIS — E782 Mixed hyperlipidemia: Secondary | ICD-10-CM

## 2018-01-05 DIAGNOSIS — Z23 Encounter for immunization: Secondary | ICD-10-CM

## 2018-01-05 DIAGNOSIS — F32A Depression, unspecified: Secondary | ICD-10-CM

## 2018-01-05 MED ORDER — BUSPIRONE HCL 10 MG PO TABS: 10.0000 mg | ORAL_TABLET | Freq: Three times a day (TID) | ORAL | 0 refills | Status: DC

## 2018-01-05 MED ORDER — ENALAPRIL MALEATE 20 MG PO TABS: 20.0000 mg | ORAL_TABLET | Freq: Two times a day (BID) | ORAL | 1 refills | Status: DC

## 2018-01-05 MED ORDER — SERTRALINE HCL 100 MG PO TABS: 200.0000 mg | ORAL_TABLET | Freq: Every day | ORAL | 1 refills | Status: DC

## 2018-01-05 MED ORDER — ATORVASTATIN CALCIUM 20 MG PO TABS: 20.0000 mg | ORAL_TABLET | Freq: Every day | ORAL | 1 refills | Status: DC

## 2018-01-05 MED ORDER — AMLODIPINE BESYLATE 10 MG PO TABS: 10.0000 mg | ORAL_TABLET | Freq: Every day | ORAL | 1 refills | Status: DC

## 2018-01-05 MED ORDER — ALPRAZOLAM 0.5 MG PO TABS: 0.5000 mg | ORAL_TABLET | Freq: Every evening | ORAL | 5 refills | Status: DC | PRN

## 2018-01-05 NOTE — Progress Notes (Signed)
Subjective:    Patient ID: Raymond Hardin, male    DOB: [birth year], 77 y.o.   MRN: [id number]  HPI Pt is a 77 yo male with HTN, HLD, depression, anxiety who presents to the clinic for 6 month follow up.   Overall he is doing well. His pain is better controlled working with ortho. He is anxious right now with sister in rest home and he was kicked out of his trailer. He has a place to stay but won't be ready for 2 more week. He is staying with friends right now. He does feel overwhelmed and down. He continues to use 20 xanax a month.   Pt denies any CP, palpitations, headaches, vision changes, or dizziness. He is taking his medication daily.   He never had fasting labs drawn for pre-diabetes and cholesterol.   .. Active Ambulatory Problems    Diagnosis Date Noted  . Hypertension, essential, benign 11/04/2011  . Erectile dysfunction 11/04/2011  . Tobacco abuse 12/12/2012  . Depression 12/12/2012  . Primary osteoarthritis of right knee 04/24/2013  . Vitamin D deficiency 08/13/2013  . Pre-diabetes 08/16/2013  . Status post left knee replacement 08/13/2014  . Anxiety state 08/13/2014  . Essential hypertension, benign 08/13/2014  . Hyperlipidemia 02/24/2015  . Hypertriglyceridemia 02/24/2015  . CKD (chronic kidney disease), stage III ([place]) 12/07/2015  . S/P total knee replacement, right 01/31/2017   Resolved Ambulatory Problems    Diagnosis Date Noted  . No Resolved Ambulatory Problems   Past Medical History:  Diagnosis Date  . Hypertension   . Insomnia 2005  . Osteoarthritis 2000      Review of Systems  All other systems reviewed and are negative.      Objective:   Physical Exam  Constitutional: He is oriented to person, place, and time. He appears well-developed and well-nourished.  HENT:  Head: Normocephalic and atraumatic.  Cardiovascular: Normal rate and regular rhythm.  Pulmonary/Chest: Effort normal and breath sounds normal.  Neurological: He is alert and  oriented to person, place, and time.  Psychiatric: He has a normal mood and affect. His behavior is normal.          Assessment & Plan:  Marland KitchenMarland KitchenDiagnoses and all orders for this visit:  Hypertension, essential, benign -     amLODipine (NORVASC) 10 MG tablet; Take 1 tablet (10 mg total) by mouth daily. -     enalapril (VASOTEC) 20 MG tablet; Take 1 tablet (20 mg total) by mouth 2 (two) times daily.  Need for immunization against influenza -     Flu Vaccine QUAD 36+ mos IM  Need for Tdap vaccination -     Tdap vaccine greater than or equal to 7yo IM  CKD (chronic kidney disease), stage III (HCC) -     COMPLETE METABOLIC PANEL WITH GFR  Anxiety state -     busPIRone (BUSPAR) 10 MG tablet; Take 1 tablet (10 mg total) by mouth 3 (three) times daily. -     ALPRAZolam (XANAX) 0.5 MG tablet; Take 1 tablet (0.5 mg total) by mouth at bedtime as needed for anxiety.  Depression, unspecified depression type -     sertraline (ZOLOFT) 100 MG tablet; Take 2 tablets (200 mg total) by mouth daily.  Mixed hyperlipidemia -     Lipid Panel w/reflex Direct LDL -     atorvastatin (LIPITOR) 20 MG tablet; Take 1 tablet (20 mg total) by mouth daily.  Screening for diabetes mellitus -     COMPLETE METABOLIC  PANEL WITH GFR -     Hemoglobin A1c   .Marland Kitchen Depression screen [facility] 2/9 01/05/2018 06/28/2017 03/28/2017 07/25/2016 07/11/2016  Decreased Interest 2 1 2 1 2   Down, Depressed, Hopeless 0 1 3 1 2   PHQ - 2 Score 2 2 5 2 4   Altered sleeping 3 2 2  0 0  Tired, decreased energy - 2 3 1 2   Change in appetite 2 3 2 1 2   Feeling bad or failure about yourself  2 0 1 0 0  Trouble concentrating 0 1 1 0 0  Moving slowly or fidgety/restless 0 2 3 1  0  Suicidal thoughts 0 1 3 0 0  PHQ-9 Score 9 13 20 5 8   Difficult doing work/chores Not difficult at all - - - -   .Marland Kitchen GAD 7 : Generalized Anxiety Score 01/05/2018 06/28/2017 01/31/2017 01/31/2017  Nervous, Anxious, on Edge 1 2 3 3   Control/stop worrying 3 2 3 3   Worry  too much - different things 3 3 2 2   Trouble relaxing 1 2 2 2   Restless 0 2 3 3   Easily annoyed or irritable 2 2 2 2   Afraid - awful might happen 2 2 1 1   Total GAD 7 Score 12 15 16 16   Anxiety Difficulty Somewhat difficult - - -    He states depression is not difficult on screenings but verbalizes another thing. Increased zoloft to 2 tablets daily(200mg ). Continue buspar. Xanax only as needed no more than 20 a month. Pt aware of dependence.   BP good. Med refills given.   Fasting labs ordered for follow up. Will adjust medication accordingly.   Flu shot and Tdap given today.   Follow up in 6 months.

## 2018-01-09 NOTE — Progress Notes (Deleted)
Subjective:   Raymond Hardin is a 77 y.o. male who presents for an Initial Medicare Annual Wellness Visit.  Review of Systems  No ROS.  Medicare Wellness Visit. Additional risk factors are reflected in the social history.    Sleep patterns: Home Safety/Smoke Alarms: Feels safe in home. Smoke alarms in place.    Male:   CCS- due 02/26/2020     PSA- No results found for: PSA     Objective:    There were no vitals filed for this visit. There is no height or weight on file to calculate BMI.  No flowsheet data found.  Current Medications (verified) Outpatient Encounter Medications as of 01/11/2018  Medication Sig  . [START ON 01/16/2018] ALPRAZolam (XANAX) 0.5 MG tablet Take 1 tablet (0.5 mg total) by mouth at bedtime as needed for anxiety.  Marland Kitchen amLODipine (NORVASC) 10 MG tablet Take 1 tablet (10 mg total) by mouth daily.  Marland Kitchen atorvastatin (LIPITOR) 20 MG tablet Take 1 tablet (20 mg total) by mouth daily.  . busPIRone (BUSPAR) 10 MG tablet Take 1 tablet (10 mg total) by mouth 3 (three) times daily.  . cholecalciferol (VITAMIN D) 1000 units tablet Take 1,000 Units by mouth daily.  . enalapril (VASOTEC) 20 MG tablet Take 1 tablet (20 mg total) by mouth 2 (two) times daily.  . sertraline (ZOLOFT) 100 MG tablet Take 2 tablets (200 mg total) by mouth daily.  . sildenafil (REVATIO) 20 MG tablet TAKE FIVE TABLETS BY MOUTH AS NEEDED FOR ERECTILE DYSFUNCTION (FOR USE PRIOR TO SEXUAL ACTIVITY)   No facility-administered encounter medications on file as of 01/11/2018.     Allergies (verified) Patient has no known allergies.   History: Past Medical History:  Diagnosis Date  . Depression   . Hyperlipidemia   . Hypertension   . Insomnia 2005  . Osteoarthritis 2000   Past Surgical History:  Procedure Laterality Date  . left shoulder replacement  2011   Family History  Problem Relation Age of Onset  . Cancer Mother   . Suicidality Father 22  . Depression Father    Social History    Socioeconomic History  . Marital status: Single    Spouse name: Not on file  . Number of children: Not on file  . Years of education: Not on file  . Highest education level: Not on file  Occupational History  . Not on file  Social Needs  . Financial resource strain: Not on file  . Food insecurity:    Worry: Not on file    Inability: Not on file  . Transportation needs:    Medical: Not on file    Non-medical: Not on file  Tobacco Use  . Smoking status: Current Every Day Smoker    Packs/day: 0.50    Years: 25.00    Pack years: 12.50    Types: Cigarettes    Last attempt to quit: 08/10/2015    Years since quitting: 2.4  . Smokeless tobacco: Never Used  Substance and Sexual Activity  . Alcohol use: No    Alcohol/week: 0.0 standard drinks  . Drug use: No  . Sexual activity: Yes    Partners: Female  Lifestyle  . Physical activity:    Days per week: Not on file    Minutes per session: Not on file  . Stress: Not on file  Relationships  . Social connections:    Talks on phone: Not on file    Gets together: Not on file  Attends religious service: Not on file    Active member of club or organization: Not on file    Attends meetings of clubs or organizations: Not on file    Relationship status: Not on file  Other Topics Concern  . Not on file  Social History Narrative   Seperated.   Tobacco Counseling Ready to quit: Not Answered Counseling given: Not Answered   Clinical Intake:                       Activities of Daily Living No flowsheet data found.   Immunizations and Health Maintenance Immunization History  Administered Date(s) Administered  . Influenza,inj,Quad PF,6+ Mos 01/23/2013, 02/23/2015, 03/01/2016, 01/31/2017, 01/05/2018  . Pneumococcal Conjugate-13 08/13/2014  . Pneumococcal Polysaccharide-23 07/01/2011  . Tdap 01/05/2018   There are no preventive care reminders to display for this patient.  Patient Care Team: Raymond Hardin as PCP - General (Family Medicine)  Indicate any recent Medical Services you may have received from other than [facility] providers in the past year (date may be approximate).    Assessment:   This is a routine wellness examination for [name]. Physical assessment deferred to PCP.  Diet (meal preparation, eat out, water intake, caffeinated beverages, dairy products, fruits and vegetables): {Desc; diets:16563} Breakfast: Lunch:  Dinner:      Hearing/Vision screen No exam data present    Goals   None    Depression Screen PHQ 2/9 Scores 01/05/2018 06/28/2017 03/28/2017 07/25/2016  PHQ - 2 Score 2 2 5 2   PHQ- 9 Score 9 13 20 5   Exception Documentation - Medical reason - -    Cognitive Function:        Screening Tests Health Maintenance  Topic Date Due  . COLONOSCOPY  02/28/2020  . TETANUS/TDAP  01/06/2028  . INFLUENZA VACCINE  Completed  . PNA vac Low Risk Adult  Completed      Plan:   ***  I have personally reviewed and noted the following in the patient's chart:   . Medical and social history . Use of alcohol, tobacco or illicit drugs  . Current medications and supplements . Functional ability and status . Nutritional status . Physical activity . Advanced directives . List of other physicians . Hospitalizations, surgeries, and ER visits in previous 12 months . Vitals . Screenings to include cognitive, depression, and falls . Referrals and appointments  In addition, I have reviewed and discussed with patient certain preventive protocols, quality metrics, and best practice recommendations. A written personalized care plan for preventive services as well as general preventive health recommendations were provided to patient.     Raymond Chars, LPN   05/14/1094

## 2018-01-11 ENCOUNTER — Ambulatory Visit: Payer: Medicare HMO

## 2018-01-17 NOTE — Progress Notes (Signed)
Subjective:   Raymond Hardin is a 77 y.o. male who presents for an Initial Medicare Annual Wellness Visit.  Review of Systems  No ROS.  Medicare Wellness Visit. Additional risk factors are reflected in the social history.  Cardiac Risk Factors include: dyslipidemia;hypertension;sedentary lifestyle;obesity (BMI >30kg/m2);smoking/ tobacco exposure Sleep patterns:Gets 6-7 hours of sleep a night Home Safety/Smoke Alarms: Feels safe in home. Smoke alarms in place.  Living environment; Lives alone. One story home. Ramp in place for the front door. Has step over shower no rails in place but does have a non slip mat in tub.    Male:   CCS-due 02/26/2020     PSA- No results found for: PSA  Eye Exam- scheduled November 6     Objective:    Today's Vitals   01/22/18 1347  BP: 113/67  Pulse: 82  SpO2: 97%  Weight: 221 lb (100.2 kg)  Height: 5\' 11"  (1.803 m)   Body mass index is 30.82 kg/m.  Advanced Directives 01/22/2018  Does Patient Have a Medical Advance Directive? No  Would patient like information on creating a medical advance directive? Yes (MAU/Ambulatory/Procedural Areas - Information given)    Current Medications (verified) Outpatient Encounter Medications as of 01/22/2018  Medication Sig  . ALPRAZolam (XANAX) 0.5 MG tablet Take 1 tablet (0.5 mg total) by mouth at bedtime as needed for anxiety.  Marland Kitchen amLODipine (NORVASC) 10 MG tablet Take 1 tablet (10 mg total) by mouth daily.  Marland Kitchen atorvastatin (LIPITOR) 20 MG tablet Take 1 tablet (20 mg total) by mouth daily.  . busPIRone (BUSPAR) 10 MG tablet Take 1 tablet (10 mg total) by mouth 3 (three) times daily.  . cholecalciferol (VITAMIN D) 1000 units tablet Take 1,000 Units by mouth daily.  . enalapril (VASOTEC) 20 MG tablet Take 1 tablet (20 mg total) by mouth 2 (two) times daily.  . sertraline (ZOLOFT) 100 MG tablet Take 2 tablets (200 mg total) by mouth daily.  . sildenafil (REVATIO) 20 MG tablet TAKE FIVE TABLETS BY MOUTH AS  NEEDED FOR ERECTILE DYSFUNCTION (FOR USE PRIOR TO SEXUAL ACTIVITY)   No facility-administered encounter medications on file as of 01/22/2018.     Allergies (verified) Patient has no known allergies.   History: Past Medical History:  Diagnosis Date  . Depression   . Hyperlipidemia   . Hypertension   . Insomnia 2005  . Osteoarthritis 2000   Past Surgical History:  Procedure Laterality Date  . left shoulder replacement  2011  . REPLACEMENT TOTAL KNEE Bilateral    Family History  Problem Relation Age of Onset  . Cancer Mother   . Suicidality Father 27  . Depression Father    Social History   Socioeconomic History  . Marital status: Legally Separated    Spouse name: Not on file  . Number of children: 1  . Years of education: 23  . Highest education level: 12th grade  Occupational History  . 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 Financial resource strain: Not hard at all  . Food insecurity:    Worry: Never true    Inability: Never true  . Transportation needs:    Medical: No    Non-medical: No  Tobacco Use  . Smoking status: Current Every Day Smoker    Packs/day: 0.50    Years: 25.00    Pack years: 12.50    Types: Cigarettes    Last attempt to quit: 08/10/2015    Years since  quitting: 2.4  . Smokeless tobacco: Never Used  Substance and Sexual Activity  . Alcohol use: Yes    Alcohol/week: 0.0 standard drinks    Comment: occasional  . Drug use: No  . Sexual activity: Yes    Partners: Female  Lifestyle  . Physical activity:    Days per week: 4 days    Minutes per session: 20 min  . Stress: Not at all  Relationships  . Social connections:    Talks on phone: Twice a week    Gets together: Three times a week    Attends religious service: More than 4 times per year    Active member of club or organization: No    Attends meetings of clubs or organizations: Never    Relationship status: Separated  Other Topics Concern  . Not on file   Social History Narrative   Seperated.   Tobacco Counseling Ready to quit: No Counseling given: Not Answered   Clinical Intake:                       Activities of Daily Living In your present state of health, do you have any difficulty performing the following activities: 01/22/2018  Hearing? N  Vision? N  Difficulty concentrating or making decisions? N  Walking or climbing stairs? N  Dressing or bathing? N  Doing errands, shopping? N  Preparing Food and eating ? N  Using the Toilet? N  In the past six months, have you accidently leaked urine? Y  Comment patient states not often  Do you have problems with loss of bowel control? N  Managing your Medications? N  Managing your Finances? N  Housekeeping or managing your Housekeeping? N  Some recent data might be hidden     Immunizations and Health Maintenance Immunization History  Administered Date(s) Administered  . Influenza,inj,Quad PF,6+ Mos 01/23/2013, 02/23/2015, 03/01/2016, 01/31/2017, 01/05/2018  . Pneumococcal Conjugate-13 08/13/2014  . Pneumococcal Polysaccharide-23 07/01/2011  . Tdap 01/05/2018   There are no preventive care reminders to display for this patient.  Patient Care Team: Lavada Mesi as PCP - General (Family Medicine)  Indicate any recent Medical Services you may have received from other than [facility] providers in the past year (date may be approximate).    Assessment:   This is a routine wellness examination for Raymond Hardin.Physical assessment deferred to PCP.   Hearing/Vision screen  Hearing Screening   Method: Audiometry   125Hz  250Hz  500Hz  1000Hz  2000Hz  3000Hz  4000Hz  6000Hz  8000Hz   Right ear:           Left ear:             Visual Acuity Screening   Right eye Left eye Both eyes  Without correction:     With correction: 20/25 20/25 20/25     Dietary issues and exercise activities discussed: Current Exercise Habits: Home exercise routine, Type of exercise: walking, Time  (Minutes): 20, Frequency (Times/Week): 4, Weekly Exercise (Minutes/Week): 80, Intensity: Mild Diet: healthy diet for the most part. Tried to veggies every day. States does eat out often.Advised patient to watch eating out so much because of th salt content.       Goals    . DIET - INCREASE WATER INTAKE     Goal is to drink at least 32 ounces of water a day. Watch salt intake daily.      Depression Screen PHQ 2/9 Scores 01/22/2018 01/05/2018 06/28/2017 03/28/2017  PHQ - 2 Score 1 2  2 5  PHQ- 9 Score 1 9 13 20   Exception Documentation - - Medical reason -    Fall Risk Fall Risk  01/22/2018  Falls in the past year? No    Is the patient's home free of loose throw rugs in walkways, pet beds, electrical cords, etc?   yes      Grab bars in the bathroom? no      Handrails on the stairs?   yes      Adequate lighting?   yes   Cognitive Function:     6CIT Screen 01/22/2018  What Year? 0 points  What month? 0 points  What time? 0 points  Count back from 20 0 points  Months in reverse 2 points  Repeat phrase 0 points  Total Score 2    Screening Tests Health Maintenance  Topic Date Due  . COLONOSCOPY  02/28/2020  . TETANUS/TDAP  01/06/2028  . INFLUENZA VACCINE  Completed  . PNA vac Low Risk Adult  Completed       Plan:  Please schedule your next medicare wellness visit with me in 1 yr.  Mr. Scholle , Thank you for taking time to come for your Medicare Wellness Visit. I appreciate your ongoing commitment to your health goals. Please review the following plan we discussed and let me know if I can assist you in the future.  Continue to read daily for brain stimulation. Watch carb and fat intake as well.  These are the goals we discussed: Goals    . DIET - INCREASE WATER INTAKE     Goal is to drink at least 32 ounces of water a day. Watch salt intake daily.       This is a list of the screening recommended for you and due dates:  Health Maintenance  Topic Date Due  . Colon  Cancer Screening  02/28/2020  . Tetanus Vaccine  01/06/2028  . Flu Shot  Completed  . Pneumonia vaccines  Completed     I have personally reviewed and noted the following in the patient's chart:   . Medical and social history . Use of alcohol, tobacco or illicit drugs  . Current medications and supplements . Functional ability and status . Nutritional status . Physical activity . Advanced directives . List of other physicians . Hospitalizations, surgeries, and ER visits in previous 12 months . Vitals . Screenings to include cognitive, depression, and falls . Referrals and appointments  In addition, I have reviewed and discussed with patient certain preventive protocols, quality metrics, and best practice recommendations. A written personalized care plan for preventive services as well as general preventive health recommendations were provided to patient.     Joanne Chars, LPN   3/66/2947

## 2018-01-22 ENCOUNTER — Ambulatory Visit (INDEPENDENT_AMBULATORY_CARE_PROVIDER_SITE_OTHER): Payer: Medicare HMO | Admitting: *Deleted

## 2018-01-22 VITALS — BP 113/67 | HR 82 | Ht 71.0 in | Wt 221.0 lb

## 2018-01-22 DIAGNOSIS — Z0000 Encounter for general adult medical examination without abnormal findings: Secondary | ICD-10-CM

## 2018-01-22 DIAGNOSIS — E7800 Pure hypercholesterolemia, unspecified: Secondary | ICD-10-CM | POA: Diagnosis not present

## 2018-01-22 DIAGNOSIS — N183 Chronic kidney disease, stage 3 (moderate): Secondary | ICD-10-CM | POA: Diagnosis not present

## 2018-01-22 DIAGNOSIS — Z131 Encounter for screening for diabetes mellitus: Secondary | ICD-10-CM | POA: Diagnosis not present

## 2018-01-22 NOTE — Patient Instructions (Addendum)
Please schedule your next medicare wellness visit with me in 1 yr. Raymond Hardin , Thank you for taking time to come for your Medicare Wellness Visit. I appreciate your ongoing commitment to your health goals. Please review the following plan we discussed and let me know if I can assist you in the future.  Continue to read daily for brain stimulation. Watch carb and fat intake as well. Cholesterol Cholesterol is a white, waxy, fat-like substance that is needed by the human body in small amounts. The liver makes all the cholesterol we need. Cholesterol is carried from the liver by the blood through the blood vessels. Deposits of cholesterol (plaques) may build up on blood vessel (artery) walls. Plaques make the arteries narrower and stiffer. Cholesterol plaques increase the risk for heart attack and stroke. You cannot feel your cholesterol level even if it is very high. The only way to know that it is high is to have a blood test. Once you know your cholesterol levels, you should keep a record of the test results. Work with your health care provider to keep your levels in the desired range. What do the results mean?  Total cholesterol is a rough measure of all the cholesterol in your blood.  LDL (low-density lipoprotein) is the "bad" cholesterol. This is the type that causes plaque to build up on the artery walls. You want this level to be low.  HDL (high-density lipoprotein) is the "good" cholesterol because it cleans the arteries and carries the LDL away. You want this level to be high.  Triglycerides are fat that the body can either burn for energy or store. High levels are closely linked to heart disease. What are the desired levels of cholesterol?  Total cholesterol below 200.  LDL below 100 for people who are at risk, below 70 for people at very high risk.  HDL above 40 is good. A level of 60 or higher is considered to be protective against heart disease.  Triglycerides below 150. How can  I lower my cholesterol? Diet Follow your diet program as told by your health care provider.  Choose fish or white meat chicken and [place], roasted or baked. Limit fatty cuts of red meat, fried foods, and processed meats, such as sausage and lunch meats.  Eat lots of fresh fruits and vegetables.  Choose whole grains, beans, pasta, potatoes, and cereals.  Choose olive oil, corn oil, or canola oil, and use only small amounts.  Avoid butter, mayonnaise, shortening, or palm kernel oils.  Avoid foods with trans fats.  Drink skim or nonfat milk and eat low-fat or nonfat yogurt and cheeses. Avoid whole milk, cream, ice cream, egg yolks, and full-fat cheeses.  Healthier desserts include angel food cake, ginger snaps, animal crackers, hard candy, popsicles, and low-fat or nonfat frozen yogurt. Avoid pastries, cakes, pies, and cookies.  Exercise  Follow your exercise program as told by your health care provider. A regular program: ? Helps to decrease LDL and raise HDL. ? Helps with weight control.  Do things that increase your activity level, such as gardening, walking, and taking the stairs.  Ask your health care provider about ways that you can be more active in your daily life.  Medicine  Take over-the-counter and prescription medicines only as told by your health care provider. ? Medicine may be prescribed by your health care provider to help lower cholesterol and decrease the risk for heart disease. This is usually done if diet and exercise have failed to  bring down cholesterol levels. ? If you have several risk factors, you may need medicine even if your levels are normal.  This information is not intended to replace advice given to you by your health care provider. Make sure you discuss any questions you have with your health care provider. Document Released: 01/18/2001 Document Revised: 11/21/2015 Document Reviewed: 10/24/2015 Elsevier Interactive Patient Education  2018 [place] DASH stands for "Dietary Approaches to Stop Hypertension." The DASH eating plan is a healthy eating plan that has been shown to reduce high blood pressure (hypertension). It may also reduce your risk for type 2 diabetes, heart disease, and stroke. The DASH eating plan may also help with weight loss. What are tips for following this plan? General guidelines  Avoid eating more than 2,300 mg (milligrams) of salt (sodium) a day. If you have hypertension, you may need to reduce your sodium intake to 1,500 mg a day.  Limit alcohol intake to no more than 1 drink a day for nonpregnant women and 2 drinks a day for men. One drink equals 12 oz of beer, 5 oz of wine, or 1 oz of hard liquor.  Work with your health care provider to maintain a healthy body weight or to lose weight. Ask what an ideal weight is for you.  Get at least 30 minutes of exercise that causes your heart to beat faster (aerobic exercise) most days of the week. Activities may include walking, swimming, or biking.  Work with your health care provider or diet and nutrition specialist (dietitian) to adjust your eating plan to your individual calorie needs. Reading food labels  Check food labels for the amount of sodium per serving. Choose foods with less than 5 percent of the Daily Value of sodium. Generally, foods with less than 300 mg of sodium per serving fit into this eating plan.  To find whole grains, look for the word "whole" as the first word in the ingredient list. Shopping  Buy products labeled as "low-sodium" or "no salt added."  Buy fresh foods. Avoid canned foods and premade or frozen meals. Cooking  Avoid adding salt when cooking. Use salt-free seasonings or herbs instead of table salt or sea salt. Check with your health care provider or pharmacist before using salt substitutes.  Do not fry foods. Cook foods using healthy methods such as baking, boiling, grilling, and broiling instead.  Cook  with heart-healthy oils, such as olive, canola, soybean, or sunflower oil. Meal planning   Eat a balanced diet that includes: ? 5 or more servings of fruits and vegetables each day. At each meal, try to fill half of your plate with fruits and vegetables. ? Up to 6-8 servings of whole grains each day. ? Less than 6 oz of lean meat, poultry, or fish each day. A 3-oz serving of meat is about the same size as a deck of cards. One egg equals 1 oz. ? 2 servings of low-fat dairy each day. ? A serving of nuts, seeds, or beans 5 times each week. ? Heart-healthy fats. Healthy fats called Omega-3 fatty acids are found in foods such as flaxseeds and coldwater fish, like sardines, salmon, and mackerel.  Limit how much you eat of the following: ? Canned or prepackaged foods. ? Food that is high in trans fat, such as fried foods. ? Food that is high in saturated fat, such as fatty meat. ? Sweets, desserts, sugary drinks, and other foods with added sugar. ? Full-fat dairy  products.  Do not salt foods before eating.  Try to eat at least 2 vegetarian meals each week.  Eat more home-cooked food and less restaurant, buffet, and fast food.  When eating at a restaurant, ask that your food be prepared with less salt or no salt, if possible. What foods are recommended? The items listed may not be a complete list. Talk with your dietitian about what dietary choices are best for you. Grains Whole-grain or whole-wheat bread. Whole-grain or whole-wheat pasta. Brown rice. [place]. Bulgur. Whole-grain and low-sodium cereals. Pita bread. Low-fat, low-sodium crackers. Whole-wheat flour tortillas. Vegetables Fresh or frozen vegetables (raw, steamed, roasted, or grilled). Low-sodium or reduced-sodium tomato and vegetable juice. Low-sodium or reduced-sodium tomato sauce and tomato paste. Low-sodium or reduced-sodium canned vegetables. Fruits All fresh, dried, or frozen fruit. Canned fruit in natural juice  (without added sugar). Meat and other protein foods Skinless chicken or [place]. Ground chicken or [place]. Pork with fat trimmed off. Fish and seafood. Egg whites. Dried beans, peas, or lentils. Unsalted nuts, nut butters, and seeds. Unsalted canned beans. Lean cuts of beef with fat trimmed off. Low-sodium, lean deli meat. Dairy Low-fat (1%) or fat-free (skim) milk. Fat-free, low-fat, or reduced-fat cheeses. Nonfat, low-sodium ricotta or cottage cheese. Low-fat or nonfat yogurt. Low-fat, low-sodium cheese. Fats and oils Soft margarine without trans fats. Vegetable oil. Low-fat, reduced-fat, or light mayonnaise and salad dressings (reduced-sodium). Canola, safflower, olive, soybean, and sunflower oils. Avocado. Seasoning and other foods Herbs. Spices. Seasoning mixes without salt. Unsalted popcorn and pretzels. Fat-free sweets. What foods are not recommended? The items listed may not be a complete list. Talk with your dietitian about what dietary choices are best for you. Grains Baked goods made with fat, such as croissants, muffins, or some breads. Dry pasta or rice meal packs. Vegetables Creamed or fried vegetables. Vegetables in a cheese sauce. Regular canned vegetables (not low-sodium or reduced-sodium). Regular canned tomato sauce and paste (not low-sodium or reduced-sodium). Regular tomato and vegetable juice (not low-sodium or reduced-sodium). Angie Fava. Olives. Fruits Canned fruit in a light or heavy syrup. Fried fruit. Fruit in cream or butter sauce. Meat and other protein foods Fatty cuts of meat. Ribs. Fried meat. Berniece Salines. Sausage. Bologna and other processed lunch meats. Salami. Fatback. Hotdogs. Bratwurst. Salted nuts and seeds. Canned beans with added salt. Canned or smoked fish. Whole eggs or egg yolks. Chicken or [place] with skin. Dairy Whole or 2% milk, cream, and half-and-half. Whole or full-fat cream cheese. Whole-fat or sweetened yogurt. Full-fat cheese. Nondairy creamers. Whipped  toppings. Processed cheese and cheese spreads. Fats and oils Butter. Stick margarine. Lard. Shortening. Ghee. Bacon fat. Tropical oils, such as coconut, palm kernel, or palm oil. Seasoning and other foods Salted popcorn and pretzels. Onion salt, garlic salt, seasoned salt, table salt, and sea salt. Worcestershire sauce. Tartar sauce. Barbecue sauce. Teriyaki sauce. Soy sauce, including reduced-sodium. Steak sauce. Canned and packaged gravies. Fish sauce. Oyster sauce. Cocktail sauce. Horseradish that you find on the shelf. Ketchup. Mustard. Meat flavorings and tenderizers. Bouillon cubes. Hot sauce and Tabasco sauce. Premade or packaged marinades. Premade or packaged taco seasonings. Relishes. Regular salad dressings. Where to find more information:  National Heart, Lung, and [place]: https://wilson-eaton.com/  American Heart Association: www.heart.org Summary  The DASH eating plan is a healthy eating plan that has been shown to reduce high blood pressure (hypertension). It may also reduce your risk for type 2 diabetes, heart disease, and stroke.  With the DASH eating plan, you should limit  salt (sodium) intake to 2,300 mg a day. If you have hypertension, you may need to reduce your sodium intake to 1,500 mg a day.  When on the DASH eating plan, aim to eat more fresh fruits and vegetables, whole grains, lean proteins, low-fat dairy, and heart-healthy fats.  Work with your health care provider or diet and nutrition specialist (dietitian) to adjust your eating plan to your individual calorie needs. This information is not intended to replace advice given to you by your health care provider. Make sure you discuss any questions you have with your health care provider. Document Released: 04/14/2011 Document Revised: 04/18/2016 Document Reviewed: 04/18/2016 Elsevier Interactive Patient Education  [employer].  These are the goals we discussed: Goals    . DIET - INCREASE WATER INTAKE      Goal is to drink at least 32 ounces of water a day. Watch salt intake daily.

## 2018-01-23 LAB — COMPLETE METABOLIC PANEL WITH GFR
AG RATIO: 1.8 (calc) (ref 1.0–2.5)
ALT: 10 U/L (ref 9–46)
AST: 16 U/L (ref 10–35)
Albumin: 4.2 g/dL (ref 3.6–5.1)
Alkaline phosphatase (APISO): 52 U/L (ref 40–115)
BILIRUBIN TOTAL: 0.4 mg/dL (ref 0.2–1.2)
BUN/Creatinine Ratio: 9 (calc) (ref 6–22)
BUN: 12 mg/dL (ref 7–25)
CALCIUM: 9.2 mg/dL (ref 8.6–10.3)
CHLORIDE: 107 mmol/L (ref 98–110)
CO2: 31 mmol/L (ref 20–32)
Creat: 1.27 mg/dL — ABNORMAL HIGH (ref 0.70–1.18)
GFR, EST NON AFRICAN AMERICAN: 55 mL/min/{1.73_m2} — AB (ref >=60)
GFR, Est African American: 64 mL/min/{1.73_m2} (ref >=60)
GLOBULIN: 2.3 g/dL (ref 1.9–3.7)
Glucose, Bld: 88 mg/dL (ref 65–139)
POTASSIUM: 4.5 mmol/L (ref 3.5–5.3)
SODIUM: 144 mmol/L (ref 135–146)
Total Protein: 6.5 g/dL (ref 6.1–8.1)

## 2018-01-23 LAB — LIPID PANEL W/REFLEX DIRECT LDL
CHOL/HDL RATIO: 3.7 (calc) (ref <5.0)
CHOLESTEROL: 163 mg/dL (ref <200)
HDL: 44 mg/dL (ref >40)
LDL CHOLESTEROL (CALC): 88 mg/dL
NON-HDL CHOLESTEROL (CALC): 119 mg/dL (ref <130)
TRIGLYCERIDES: 223 mg/dL — AB (ref <150)

## 2018-01-23 LAB — HEMOGLOBIN A1C
EAG (MMOL/L): 7 (calc)
Hgb A1c MFr Bld: 6 % of total Hgb — ABNORMAL HIGH (ref <5.7)
MEAN PLASMA GLUCOSE: 126 (calc)

## 2018-01-23 NOTE — Progress Notes (Signed)
Call pt: A!C is 6.0 stable and still in pre-diabetes range. Watch sugars and carbs if elevates any more need to consider metformin to help with progression of sugars.  TG elevated consider fish oil 4000mg  daily with diet changes to limit sugars and carbs. Kidney function is stable.

## 2018-01-30 ENCOUNTER — Other Ambulatory Visit: Payer: Self-pay | Admitting: Physician Assistant

## 2018-01-30 DIAGNOSIS — F411 Generalized anxiety disorder: Secondary | ICD-10-CM

## 2018-02-11 ENCOUNTER — Other Ambulatory Visit: Payer: Self-pay | Admitting: Physician Assistant

## 2018-02-11 ENCOUNTER — Other Ambulatory Visit: Payer: Self-pay | Admitting: Sports Medicine

## 2018-02-11 DIAGNOSIS — E7800 Pure hypercholesterolemia, unspecified: Secondary | ICD-10-CM

## 2018-02-12 ENCOUNTER — Other Ambulatory Visit: Payer: Self-pay

## 2018-02-12 DIAGNOSIS — F32A Depression, unspecified: Secondary | ICD-10-CM

## 2018-02-12 DIAGNOSIS — F329 Major depressive disorder, single episode, unspecified: Secondary | ICD-10-CM

## 2018-02-12 MED ORDER — SERTRALINE HCL 100 MG PO TABS: 200.0000 mg | ORAL_TABLET | Freq: Every day | ORAL | 1 refills | Status: DC

## 2018-02-12 NOTE — Telephone Encounter (Signed)
To PCP

## 2018-03-13 ENCOUNTER — Other Ambulatory Visit: Payer: Self-pay | Admitting: Physician Assistant

## 2018-03-14 ENCOUNTER — Encounter: Payer: Self-pay | Admitting: Physician Assistant

## 2018-03-14 DIAGNOSIS — H52203 Unspecified astigmatism, bilateral: Secondary | ICD-10-CM | POA: Diagnosis not present

## 2018-03-14 DIAGNOSIS — H25813 Combined forms of age-related cataract, bilateral: Secondary | ICD-10-CM | POA: Diagnosis not present

## 2018-03-14 DIAGNOSIS — H02834 Dermatochalasis of left upper eyelid: Secondary | ICD-10-CM | POA: Diagnosis not present

## 2018-03-14 DIAGNOSIS — H527 Unspecified disorder of refraction: Secondary | ICD-10-CM | POA: Diagnosis not present

## 2018-03-14 DIAGNOSIS — H43812 Vitreous degeneration, left eye: Secondary | ICD-10-CM | POA: Diagnosis not present

## 2018-03-14 DIAGNOSIS — H1859 Other hereditary corneal dystrophies: Secondary | ICD-10-CM | POA: Diagnosis not present

## 2018-03-14 DIAGNOSIS — D23112 Other benign neoplasm of skin of right lower eyelid, including canthus: Secondary | ICD-10-CM | POA: Diagnosis not present

## 2018-03-14 DIAGNOSIS — H02831 Dermatochalasis of right upper eyelid: Secondary | ICD-10-CM | POA: Diagnosis not present

## 2018-03-23 DIAGNOSIS — H52203 Unspecified astigmatism, bilateral: Secondary | ICD-10-CM | POA: Diagnosis not present

## 2018-03-23 DIAGNOSIS — H25813 Combined forms of age-related cataract, bilateral: Secondary | ICD-10-CM | POA: Diagnosis not present

## 2018-03-29 DIAGNOSIS — F329 Major depressive disorder, single episode, unspecified: Secondary | ICD-10-CM | POA: Diagnosis not present

## 2018-03-29 DIAGNOSIS — M1990 Unspecified osteoarthritis, unspecified site: Secondary | ICD-10-CM | POA: Diagnosis not present

## 2018-03-29 DIAGNOSIS — H02834 Dermatochalasis of left upper eyelid: Secondary | ICD-10-CM | POA: Diagnosis not present

## 2018-03-29 DIAGNOSIS — H278 Other specified disorders of lens: Secondary | ICD-10-CM | POA: Diagnosis not present

## 2018-03-29 DIAGNOSIS — I10 Essential (primary) hypertension: Secondary | ICD-10-CM | POA: Diagnosis not present

## 2018-03-29 DIAGNOSIS — E785 Hyperlipidemia, unspecified: Secondary | ICD-10-CM | POA: Diagnosis not present

## 2018-03-29 DIAGNOSIS — F17210 Nicotine dependence, cigarettes, uncomplicated: Secondary | ICD-10-CM | POA: Diagnosis not present

## 2018-03-29 DIAGNOSIS — H25811 Combined forms of age-related cataract, right eye: Secondary | ICD-10-CM | POA: Diagnosis not present

## 2018-03-29 DIAGNOSIS — E669 Obesity, unspecified: Secondary | ICD-10-CM | POA: Diagnosis not present

## 2018-03-30 ENCOUNTER — Encounter: Payer: Self-pay | Admitting: Physician Assistant

## 2018-03-30 DIAGNOSIS — H43812 Vitreous degeneration, left eye: Secondary | ICD-10-CM | POA: Diagnosis not present

## 2018-03-30 DIAGNOSIS — Z961 Presence of intraocular lens: Secondary | ICD-10-CM | POA: Diagnosis not present

## 2018-03-30 DIAGNOSIS — H02834 Dermatochalasis of left upper eyelid: Secondary | ICD-10-CM | POA: Diagnosis not present

## 2018-03-30 DIAGNOSIS — H52203 Unspecified astigmatism, bilateral: Secondary | ICD-10-CM | POA: Diagnosis not present

## 2018-03-30 DIAGNOSIS — D23112 Other benign neoplasm of skin of right lower eyelid, including canthus: Secondary | ICD-10-CM | POA: Diagnosis not present

## 2018-03-30 DIAGNOSIS — H25812 Combined forms of age-related cataract, left eye: Secondary | ICD-10-CM | POA: Diagnosis not present

## 2018-03-30 DIAGNOSIS — H527 Unspecified disorder of refraction: Secondary | ICD-10-CM | POA: Diagnosis not present

## 2018-03-30 DIAGNOSIS — H1859 Other hereditary corneal dystrophies: Secondary | ICD-10-CM | POA: Diagnosis not present

## 2018-03-30 DIAGNOSIS — H02831 Dermatochalasis of right upper eyelid: Secondary | ICD-10-CM | POA: Diagnosis not present

## 2018-04-09 ENCOUNTER — Encounter: Payer: Self-pay | Admitting: Physician Assistant

## 2018-04-09 DIAGNOSIS — H43812 Vitreous degeneration, left eye: Secondary | ICD-10-CM | POA: Insufficient documentation

## 2018-04-09 DIAGNOSIS — H1850 Unspecified hereditary corneal dystrophies: Secondary | ICD-10-CM

## 2018-04-09 DIAGNOSIS — H18509 Unspecified hereditary corneal dystrophies, unspecified eye: Secondary | ICD-10-CM | POA: Insufficient documentation

## 2018-04-10 DIAGNOSIS — H25813 Combined forms of age-related cataract, bilateral: Secondary | ICD-10-CM | POA: Diagnosis not present

## 2018-04-10 DIAGNOSIS — H52222 Regular astigmatism, left eye: Secondary | ICD-10-CM | POA: Diagnosis not present

## 2018-04-10 DIAGNOSIS — H02834 Dermatochalasis of left upper eyelid: Secondary | ICD-10-CM | POA: Diagnosis not present

## 2018-04-10 DIAGNOSIS — I10 Essential (primary) hypertension: Secondary | ICD-10-CM | POA: Diagnosis not present

## 2018-04-10 DIAGNOSIS — E785 Hyperlipidemia, unspecified: Secondary | ICD-10-CM | POA: Diagnosis not present

## 2018-04-10 DIAGNOSIS — H1850 Unspecified hereditary corneal dystrophies: Secondary | ICD-10-CM | POA: Diagnosis not present

## 2018-04-10 DIAGNOSIS — H25812 Combined forms of age-related cataract, left eye: Secondary | ICD-10-CM | POA: Diagnosis not present

## 2018-04-10 DIAGNOSIS — H2510 Age-related nuclear cataract, unspecified eye: Secondary | ICD-10-CM | POA: Diagnosis not present

## 2018-04-10 DIAGNOSIS — H2513 Age-related nuclear cataract, bilateral: Secondary | ICD-10-CM | POA: Diagnosis not present

## 2018-04-10 DIAGNOSIS — H02831 Dermatochalasis of right upper eyelid: Secondary | ICD-10-CM | POA: Diagnosis not present

## 2018-04-10 DIAGNOSIS — H52203 Unspecified astigmatism, bilateral: Secondary | ICD-10-CM | POA: Diagnosis not present

## 2018-04-10 DIAGNOSIS — F17200 Nicotine dependence, unspecified, uncomplicated: Secondary | ICD-10-CM | POA: Diagnosis not present

## 2018-04-10 DIAGNOSIS — Z961 Presence of intraocular lens: Secondary | ICD-10-CM | POA: Diagnosis not present

## 2018-04-10 DIAGNOSIS — F419 Anxiety disorder, unspecified: Secondary | ICD-10-CM | POA: Diagnosis not present

## 2018-04-11 DIAGNOSIS — H43812 Vitreous degeneration, left eye: Secondary | ICD-10-CM | POA: Diagnosis not present

## 2018-04-11 DIAGNOSIS — H52203 Unspecified astigmatism, bilateral: Secondary | ICD-10-CM | POA: Diagnosis not present

## 2018-04-11 DIAGNOSIS — H02831 Dermatochalasis of right upper eyelid: Secondary | ICD-10-CM | POA: Diagnosis not present

## 2018-04-11 DIAGNOSIS — H527 Unspecified disorder of refraction: Secondary | ICD-10-CM | POA: Diagnosis not present

## 2018-04-11 DIAGNOSIS — D23112 Other benign neoplasm of skin of right lower eyelid, including canthus: Secondary | ICD-10-CM | POA: Diagnosis not present

## 2018-04-11 DIAGNOSIS — Z961 Presence of intraocular lens: Secondary | ICD-10-CM | POA: Diagnosis not present

## 2018-04-11 DIAGNOSIS — H1859 Other hereditary corneal dystrophies: Secondary | ICD-10-CM | POA: Diagnosis not present

## 2018-04-11 DIAGNOSIS — H02834 Dermatochalasis of left upper eyelid: Secondary | ICD-10-CM | POA: Diagnosis not present

## 2018-04-14 ENCOUNTER — Other Ambulatory Visit: Payer: Self-pay | Admitting: Physician Assistant

## 2018-05-09 HISTORY — PX: CATARACT EXTRACTION, BILATERAL: SHX1313

## 2018-05-21 ENCOUNTER — Ambulatory Visit (INDEPENDENT_AMBULATORY_CARE_PROVIDER_SITE_OTHER): Payer: Medicare HMO | Admitting: Physician Assistant

## 2018-05-21 ENCOUNTER — Encounter: Payer: Self-pay | Admitting: Physician Assistant

## 2018-05-21 VITALS — BP 144/80 | HR 72 | Ht 71.0 in | Wt 219.0 lb

## 2018-05-21 DIAGNOSIS — H02849 Edema of unspecified eye, unspecified eyelid: Secondary | ICD-10-CM

## 2018-05-21 DIAGNOSIS — Z9841 Cataract extraction status, right eye: Secondary | ICD-10-CM

## 2018-05-21 DIAGNOSIS — F411 Generalized anxiety disorder: Secondary | ICD-10-CM | POA: Diagnosis not present

## 2018-05-21 DIAGNOSIS — F331 Major depressive disorder, recurrent, moderate: Secondary | ICD-10-CM

## 2018-05-21 DIAGNOSIS — Z9842 Cataract extraction status, left eye: Secondary | ICD-10-CM

## 2018-05-21 MED ORDER — VORTIOXETINE HBR 5 MG PO TABS: 5.0000 mg | ORAL_TABLET | Freq: Every day | ORAL | 0 refills | Status: DC

## 2018-05-21 MED ORDER — ALPRAZOLAM 0.5 MG PO TABS: 0.5000 mg | ORAL_TABLET | Freq: Every evening | ORAL | 5 refills | Status: DC | PRN

## 2018-05-21 NOTE — Patient Instructions (Addendum)
Decrease to one tablet for zoloft and start trintellix 5mg  daily. 1 month.  Get in with eye doctor. STOP Timolol/dorzolaminde

## 2018-05-21 NOTE — Progress Notes (Signed)
Subjective:    Patient ID: Raymond Hardin, male    DOB: 22-Oct-1940, 78 y.o.   MRN: [id number]  HPI  Pt is a 78 yo male who presents with his friend to the clinic to discuss bilateral eyelid edema. He had is first cataract surgery on this right before thanksgiving and then 2 weeks later his left. Dr. Lester Hardin is his eye doctor. He had minimal swelling and post surgical follow up. He brings in prednisolone, moxifloxian, ketorlac, and timolol-dorzolaminde that he has been using. Most of the drops have 'ran out" but he continues to use the timolol-dorzolaminde.   Pt feels like his swelling in eyes could have started after the timolol-dorzolaminde drops.   Pt denies any fever, chills, eye pain, body aches. He vision is doing really well.   Friend mentions his depression and mood not doing well. She mentions he is more forgetful lately. He does seem to have much energy or motivation. He has been on zoloft for a long time and wonders about switching.   .. Active Ambulatory Problems    Diagnosis Date Noted  . Hypertension, essential, benign 11/04/2011  . Erectile dysfunction 11/04/2011  . Tobacco abuse 12/12/2012  . Depression 12/12/2012  . Primary osteoarthritis of right knee 04/24/2013  . Vitamin D deficiency 08/13/2013  . Pre-diabetes 08/16/2013  . Status post left knee replacement 08/13/2014  . Anxiety state 08/13/2014  . Essential hypertension, benign 08/13/2014  . Hyperlipidemia 02/24/2015  . Hypertriglyceridemia 02/24/2015  . CKD (chronic kidney disease), stage III ([place]) 12/07/2015  . S/P total knee replacement, right 01/31/2017  . Bilateral cataracts 01/05/2018  . Posterior vitreous detachment of left eye 04/09/2018  . 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 S/P bilateral cataract extraction 05/25/2018   Resolved Ambulatory Problems    Diagnosis Date Noted  . No Resolved Ambulatory Problems   Past Medical History:  Diagnosis Date  . Hypertension   . Insomnia 2005  .  Osteoarthritis 2000       Review of Systems See HPI>     Objective:   Physical Exam Vitals signs reviewed.  Constitutional:      Appearance: Normal appearance.  HENT:     Head: Normocephalic and atraumatic.  Eyes:     Extraocular Movements: Extraocular movements intact.     Conjunctiva/sclera: Conjunctivae normal.     Pupils: Pupils are equal, round, and reactive to light.     Comments: Scant watery discharge coming from eyes.  Eyelid edema upper and lower of bilateral eyes. Right seems to be a little worse than left.   Cardiovascular:     Rate and Rhythm: Normal rate and regular rhythm.     Pulses: Normal pulses.  Pulmonary:     Effort: Pulmonary effort is normal.  Neurological:     Mental Status: He is alert.           Assessment & Plan:  Marland KitchenMarland KitchenAtsushi was seen today for facial swelling.  Diagnoses and all orders for this visit:  Edema of eyelid, unspecified laterality  Anxiety state -     Discontinue: ALPRAZolam (XANAX) 0.5 MG tablet; Take 1 tablet (0.5 mg total) by mouth at bedtime as needed for anxiety. -     ALPRAZolam (XANAX) 0.5 MG tablet; Take 1 tablet (0.5 mg total) by mouth at bedtime as needed for anxiety. -     vortioxetine HBr (TRINTELLIX) 5 MG TABS tablet; Take 1 tablet (5 mg total) by mouth daily.  Moderate episode of recurrent major depressive disorder (HCC) -  vortioxetine HBr (TRINTELLIX) 5 MG TABS tablet; Take 1 tablet (5 mg total) by mouth daily.  S/P bilateral cataract extraction  Other orders -     Discontinue: vortioxetine HBr (TRINTELLIX) 5 MG TABS tablet; Take 1 tablet (5 mg total) by mouth daily.   Vision was 20/20 both eyes. Doing great. Needs to follow up with opthalmology. When I went to reconcile medications the only drops I did not see where the timilol-dorzalaminde. Looked up side effects and does have a lot of eye irritation symptoms. Stop these and follow up this week with eye doctor. Use cool compresses.   Will try to switch  to trintellix. If approved. Decrease zoloft to 1 tablet a day and 5mg  of trintellix. Follow up in 1 month. Refilled xanax. Continue to discuss abuse potential, fall risk. Only use as needed.   Marland Kitchen.Spent 30 minutes with patient and greater than 50 percent of visit spent counseling patient regarding treatment plan.

## 2018-05-25 ENCOUNTER — Encounter: Payer: Self-pay | Admitting: Physician Assistant

## 2018-05-25 DIAGNOSIS — Z9841 Cataract extraction status, right eye: Secondary | ICD-10-CM | POA: Insufficient documentation

## 2018-05-25 DIAGNOSIS — Z9842 Cataract extraction status, left eye: Secondary | ICD-10-CM

## 2018-05-29 ENCOUNTER — Other Ambulatory Visit: Payer: Self-pay | Admitting: Physician Assistant

## 2018-05-29 DIAGNOSIS — F331 Major depressive disorder, recurrent, moderate: Secondary | ICD-10-CM

## 2018-05-29 DIAGNOSIS — F411 Generalized anxiety disorder: Secondary | ICD-10-CM

## 2018-06-16 ENCOUNTER — Other Ambulatory Visit: Payer: Self-pay | Admitting: Physician Assistant

## 2018-06-18 ENCOUNTER — Ambulatory Visit (INDEPENDENT_AMBULATORY_CARE_PROVIDER_SITE_OTHER): Payer: Medicare HMO | Admitting: Physician Assistant

## 2018-06-18 ENCOUNTER — Encounter: Payer: Self-pay | Admitting: Physician Assistant

## 2018-06-18 VITALS — BP 157/96 | HR 101 | Temp 98.4°F | Wt 220.0 lb

## 2018-06-18 DIAGNOSIS — F3341 Major depressive disorder, recurrent, in partial remission: Secondary | ICD-10-CM | POA: Diagnosis not present

## 2018-06-18 DIAGNOSIS — I10 Essential (primary) hypertension: Secondary | ICD-10-CM

## 2018-06-18 DIAGNOSIS — F411 Generalized anxiety disorder: Secondary | ICD-10-CM

## 2018-06-18 DIAGNOSIS — R7303 Prediabetes: Secondary | ICD-10-CM | POA: Diagnosis not present

## 2018-06-18 LAB — POCT GLYCOSYLATED HEMOGLOBIN (HGB A1C): HEMOGLOBIN A1C: 6 % — AB (ref 4.0–5.6)

## 2018-06-18 MED ORDER — BUSPIRONE HCL 10 MG PO TABS: 10.0000 mg | ORAL_TABLET | Freq: Three times a day (TID) | ORAL | 1 refills | Status: DC

## 2018-06-18 MED ORDER — VORTIOXETINE HBR 10 MG PO TABS: 10.0000 mg | ORAL_TABLET | Freq: Every day | ORAL | 2 refills | Status: DC

## 2018-06-18 MED ORDER — AMLODIPINE BESYLATE 10 MG PO TABS: 10.0000 mg | ORAL_TABLET | Freq: Every day | ORAL | 1 refills | Status: DC

## 2018-06-18 MED ORDER — ENALAPRIL MALEATE 20 MG PO TABS: 20.0000 mg | ORAL_TABLET | Freq: Two times a day (BID) | ORAL | 1 refills | Status: DC

## 2018-06-18 NOTE — Progress Notes (Signed)
Subjective:    Patient ID: Raymond Hardin, male    DOB: 1941/03/31, 78 y.o.   MRN: [id number]  HPI  Pt is a 78 yo male with HTN, pre-diabetes, MDD, anxiety who presents to the clinic for one month follow up on new trintellix start.   He is doing so much better. He feels like trintellix has made such a big difference in anxiety. No SI/HC. Continues on buspar. He is taking 100mg  tablet for zoloft.   HTN- no CP, palpitations, headaches or vision changes. He did not take BP medications this morning.   .. Active Ambulatory Problems    Diagnosis Date Noted  . Hypertension, essential, benign 11/04/2011  . Erectile dysfunction 11/04/2011  . Tobacco abuse 12/12/2012  . Depression 12/12/2012  . Primary osteoarthritis of right knee 04/24/2013  . Vitamin D deficiency 08/13/2013  . Pre-diabetes 08/16/2013  . Status post left knee replacement 08/13/2014  . Anxiety state 08/13/2014  . Essential hypertension, benign 08/13/2014  . Hyperlipidemia 02/24/2015  . Hypertriglyceridemia 02/24/2015  . CKD (chronic kidney disease), stage III ([place]) 12/07/2015  . S/P total knee replacement, right 01/31/2017  . Bilateral cataracts 01/05/2018  . Posterior vitreous detachment of left eye 04/09/2018  . 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 S/P bilateral cataract extraction 05/25/2018   Resolved Ambulatory Problems    Diagnosis Date Noted  . No Resolved Ambulatory Problems   Past Medical History:  Diagnosis Date  . Hypertension   . Insomnia 2005  . Osteoarthritis 2000    Review of Systems See HPI.     Objective:   Physical Exam Vitals signs reviewed.  Constitutional:      Appearance: Normal appearance.  HENT:     Head: Normocephalic and atraumatic.  Cardiovascular:     Rate and Rhythm: Normal rate and regular rhythm.  Pulmonary:     Effort: Pulmonary effort is normal.     Breath sounds: Normal breath sounds.  Neurological:     General: No focal deficit present.     Mental Status: He is alert  and oriented to person, place, and time.  Psychiatric:        Mood and Affect: Mood normal.        Behavior: Behavior normal.           Assessment & Plan:  Marland KitchenMarland KitchenCiro was seen today for medication management.  Diagnoses and all orders for this visit:  Pre-diabetes -     POCT HgB A1C  Hypertension, essential, benign -     amLODipine (NORVASC) 10 MG tablet; Take 1 tablet (10 mg total) by mouth daily. -     enalapril (VASOTEC) 20 MG tablet; Take 1 tablet (20 mg total) by mouth 2 (two) times daily.  Anxiety state -     vortioxetine HBr (TRINTELLIX) 10 MG TABS tablet; Take 1 tablet (10 mg total) by mouth daily. -     busPIRone (BUSPAR) 10 MG tablet; Take 1 tablet (10 mg total) by mouth 3 (three) times daily.  Recurrent major depressive disorder, in partial remission (HCC) -     vortioxetine HBr (TRINTELLIX) 10 MG TABS tablet; Take 1 tablet (10 mg total) by mouth daily.    .. Depression screen [facility] 2/9 06/18/2018 01/22/2018 01/05/2018 06/28/2017 03/28/2017  Decreased Interest 0 0 2 1 2   Down, Depressed, Hopeless 0 1 0 1 3  PHQ - 2 Score 0 1 2 2 5   Altered sleeping 0 0 3 2 2   Tired, decreased energy 1 - - 2  3  Change in appetite 0 0 2 3 2   Feeling bad or failure about yourself  0 0 2 0 1  Trouble concentrating 0 0 0 1 1  Moving slowly or fidgety/restless 1 0 0 2 3  Suicidal thoughts 0 0 0 1 3  PHQ-9 Score 2 1 9 13 20   Difficult doing work/chores Not difficult at all Not difficult at all Not difficult at all - -  Some recent data might be hidden   .Marland Kitchen GAD 7 : Generalized Anxiety Score 06/18/2018 01/05/2018 06/28/2017 01/31/2017  Nervous, Anxious, on Edge 2 1 2 3   Control/stop worrying 0 3 2 3   Worry too much - different things 3 3 3 2   Trouble relaxing 0 1 2 2   Restless 0 0 2 3  Easily annoyed or irritable 0 2 2 2   Afraid - awful might happen 0 2 2 1   Total GAD 7 Score 5 12 15 16   Anxiety Difficulty - Somewhat difficult - -    A!c stable at 6.0.  Doing great. On arb. On  statin.  Foot exam up to date.  Vaccines up to date.   Stop zoloft. Start 10mg  of trintellix. Hopefully can start to decrease xanax usage.   Pt did not take BP medication this morning. Instructed to always take. Will follow BP.   Follow up in 3 months.

## 2018-06-18 NOTE — Patient Instructions (Addendum)
Stop ZOLOFT. Increased trintellix to 10mg  daily.   Make sure taking BP medication daily.

## 2018-06-19 DIAGNOSIS — F3341 Major depressive disorder, recurrent, in partial remission: Secondary | ICD-10-CM | POA: Insufficient documentation

## 2018-06-20 ENCOUNTER — Other Ambulatory Visit: Payer: Self-pay | Admitting: Physician Assistant

## 2018-06-20 DIAGNOSIS — N529 Male erectile dysfunction, unspecified: Secondary | ICD-10-CM

## 2018-07-06 ENCOUNTER — Ambulatory Visit: Payer: Medicare HMO | Admitting: Physician Assistant

## 2018-07-27 DIAGNOSIS — Z711 Person with feared health complaint in whom no diagnosis is made: Secondary | ICD-10-CM | POA: Diagnosis not present

## 2018-07-27 DIAGNOSIS — R05 Cough: Secondary | ICD-10-CM | POA: Diagnosis not present

## 2018-08-29 ENCOUNTER — Telehealth: Payer: Self-pay | Admitting: Physician Assistant

## 2018-08-29 NOTE — Telephone Encounter (Signed)
PT requested a refill on a medication to make sure he has enough before his appointment on 5/11.   vortioxetine HBr (TRINTELLIX) 10 MG TABS tablet  Please Advise.

## 2018-08-30 ENCOUNTER — Other Ambulatory Visit: Payer: Self-pay

## 2018-08-30 DIAGNOSIS — F3341 Major depressive disorder, recurrent, in partial remission: Secondary | ICD-10-CM

## 2018-08-30 DIAGNOSIS — F411 Generalized anxiety disorder: Secondary | ICD-10-CM

## 2018-08-30 MED ORDER — VORTIOXETINE HBR 10 MG PO TABS: 10.0000 mg | ORAL_TABLET | Freq: Every day | ORAL | 2 refills | Status: DC

## 2018-09-11 ENCOUNTER — Telehealth: Payer: Self-pay | Admitting: Neurology

## 2018-09-11 NOTE — Telephone Encounter (Signed)
[facility] in [place] called asking for permission to fill patient's Xanax early. He told them he was going out of town and wanted to pick up RX. They need call back at [phone] with the okay.   I called patient and he states that [place] already let him pick up Xanax one day early, [place] his appt on 09/17/2018.   No need to call [facility] since patient has already picked up his RX.

## 2018-09-17 ENCOUNTER — Encounter: Payer: Self-pay | Admitting: Physician Assistant

## 2018-09-17 ENCOUNTER — Ambulatory Visit (INDEPENDENT_AMBULATORY_CARE_PROVIDER_SITE_OTHER): Payer: Medicare HMO | Admitting: Physician Assistant

## 2018-09-17 ENCOUNTER — Other Ambulatory Visit: Payer: Self-pay | Admitting: Physician Assistant

## 2018-09-17 VITALS — BP 146/98 | HR 101 | Temp 98.1°F | Ht 71.0 in | Wt 230.0 lb

## 2018-09-17 DIAGNOSIS — R7303 Prediabetes: Secondary | ICD-10-CM | POA: Diagnosis not present

## 2018-09-17 DIAGNOSIS — R0989 Other specified symptoms and signs involving the circulatory and respiratory systems: Secondary | ICD-10-CM

## 2018-09-17 DIAGNOSIS — R002 Palpitations: Secondary | ICD-10-CM | POA: Diagnosis not present

## 2018-09-17 DIAGNOSIS — I459 Conduction disorder, unspecified: Secondary | ICD-10-CM | POA: Diagnosis not present

## 2018-09-17 DIAGNOSIS — I10 Essential (primary) hypertension: Secondary | ICD-10-CM

## 2018-09-17 LAB — POCT GLYCOSYLATED HEMOGLOBIN (HGB A1C): Hemoglobin A1C: 6.2 % — AB (ref 4.0–5.6)

## 2018-09-17 MED ORDER — METFORMIN HCL 500 MG PO TABS: 500.0000 mg | ORAL_TABLET | Freq: Two times a day (BID) | ORAL | 1 refills | Status: DC

## 2018-09-17 MED ORDER — VALSARTAN-HYDROCHLOROTHIAZIDE 80-12.5 MG PO TABS: 1.0000 | ORAL_TABLET | Freq: Every day | ORAL | 1 refills | Status: DC

## 2018-09-17 NOTE — Progress Notes (Deleted)
Patient states Trintellix not working as well for him. PHQ9-GAD7 completed. Will get BP and give you reading during visit.

## 2018-09-17 NOTE — Progress Notes (Signed)
Subjective:    Patient ID: Raymond Hardin, male    DOB: June 30, 1940, 78 y.o.   MRN: [id number]  HPI  Pt is a 78 yo male with HTN, depression, HLD, pre-diabetes who presents to the clinic for 3 month follow up.   Overall patient is doing ok. He reports to be taking his medications. He is not watching his diet. He is not exercising. He is in the house a lot. He admits he is still "moody". No CP,SOB, edema. He needs refills today. No SI/HC.   Marland Kitchen. Active Ambulatory Problems    Diagnosis Date Noted  . Erectile dysfunction 11/04/2011  . Tobacco abuse 12/12/2012  . Depression 12/12/2012  . Primary osteoarthritis of right knee 04/24/2013  . Vitamin D deficiency 08/13/2013  . Pre-diabetes 08/16/2013  . Status post left knee replacement 08/13/2014  . Anxiety state 08/13/2014  . Essential hypertension, benign 08/13/2014  . Hyperlipidemia 02/24/2015  . Hypertriglyceridemia 02/24/2015  . CKD (chronic kidney disease), stage III ([place]) 12/07/2015  . S/P total knee replacement, right 01/31/2017  . Bilateral cataracts 01/05/2018  . Posterior vitreous detachment of left eye 04/09/2018  . 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 S/P bilateral cataract extraction 05/25/2018  . Recurrent major depressive disorder, in partial remission ([place]) 06/19/2018  . Skipped heart beats 09/17/2018  . Strong pulse 09/17/2018  . Palpitation 09/19/2018   Resolved Ambulatory Problems    Diagnosis Date Noted  . Hypertension, essential, benign 11/04/2011   Past Medical History:  Diagnosis Date  . Hypertension   . Insomnia 2005  . Osteoarthritis 2000     Review of Systems See HPI.     Objective:   Physical Exam Vitals signs reviewed.  Constitutional:      Appearance: Normal appearance.  Cardiovascular:     Rate and Rhythm: Normal rate.     Pulses: Normal pulses.     Comments: A few skipped beats on auscultation.  Pulmonary:     Effort: Pulmonary effort is normal.     Breath sounds: Normal breath sounds.   Neurological:     General: No focal deficit present.     Mental Status: He is alert and oriented to person, place, and time.  Psychiatric:        Mood and Affect: Mood normal.        Behavior: Behavior normal.           Assessment & Plan:  Marland KitchenMarland KitchenCobe was seen today for depression and hypertension.  Diagnoses and all orders for this visit:  Essential hypertension, benign -     valsartan-hydrochlorothiazide (DIOVAN HCT) 80-12.5 MG tablet; Take 1 tablet by mouth daily.  Strong pulse -     EKG 12-Lead  Pre-diabetes -     POCT glycosylated hemoglobin (Hb A1C) -     metFORMIN (GLUCOPHAGE) 500 MG tablet; Take 1 tablet (500 mg total) by mouth 2 (two) times daily with a meal.  Skipped heart beats  Palpitation    Depression screen [facility] 2/9 09/17/2018 06/18/2018 01/22/2018 01/05/2018 06/28/2017  Decreased Interest 2 0 0 2 1  Down, Depressed, Hopeless 1 0 1 0 1  PHQ - 2 Score 3 0 1 2 2   Altered sleeping 0 0 0 3 2  Tired, decreased energy 1 1 - - 2  Change in appetite 0 0 0 2 3  Feeling bad or failure about yourself  2 0 0 2 0  Trouble concentrating 0 0 0 0 1  Moving slowly or fidgety/restless 1 1 0  0 2  Suicidal thoughts 0 0 0 0 1  PHQ-9 Score 7 2 1 9 13   Difficult doing work/chores Somewhat difficult Not difficult at all Not difficult at all Not difficult at all -  Some recent data might be hidden   .Marland Kitchen GAD 7 : Generalized Anxiety Score 09/17/2018 06/18/2018 01/05/2018 06/28/2017  Nervous, Anxious, on Edge 1 2 1 2   Control/stop worrying 2 0 3 2  Worry too much - different things 3 3 3 3   Trouble relaxing 0 0 1 2  Restless 0 0 0 2  Easily annoyed or irritable 1 0 2 2  Afraid - awful might happen 0 0 2 2  Total GAD 7 Score 7 5 12 15   Anxiety Difficulty Somewhat difficult - Somewhat difficult -   .. Results for orders placed or performed in visit on 09/17/18  POCT glycosylated hemoglobin (Hb A1C)  Result Value Ref Range   Hemoglobin A1C 6.2 (A) 4.0 - 5.6 %   HbA1c POC (<>  result, manual entry)     HbA1c, POC (prediabetic range)     HbA1c, POC (controlled diabetic range)      EKG- NSR at 85. No ST elevation or depression. No arrhthymias. Reassuring EKG.   BP elevated. Changed BP medication to diovan/HCTZ. Stop vasotec. Continue on norvasc. Follow up in 1 month.   Increasing a1c. Started metformin. Discussed side effects. Follow up in 3 months.   Continue on trintellix for mood.

## 2018-09-17 NOTE — Patient Instructions (Addendum)
Stop vasotec.  Continue on norvasc and start diovan/HCTZ. Recheck in one month.  Add metformin twice a day with meals.

## 2018-09-19 DIAGNOSIS — R002 Palpitations: Secondary | ICD-10-CM | POA: Insufficient documentation

## 2018-10-09 ENCOUNTER — Other Ambulatory Visit: Payer: Self-pay | Admitting: Physician Assistant

## 2018-10-09 DIAGNOSIS — F3341 Major depressive disorder, recurrent, in partial remission: Secondary | ICD-10-CM

## 2018-10-09 DIAGNOSIS — F411 Generalized anxiety disorder: Secondary | ICD-10-CM

## 2018-10-14 ENCOUNTER — Other Ambulatory Visit: Payer: Self-pay | Admitting: Physician Assistant

## 2018-10-14 DIAGNOSIS — I10 Essential (primary) hypertension: Secondary | ICD-10-CM

## 2018-10-22 ENCOUNTER — Telehealth: Payer: Self-pay | Admitting: Physician Assistant

## 2018-10-22 ENCOUNTER — Ambulatory Visit: Payer: Medicare HMO | Admitting: Physician Assistant

## 2018-10-22 ENCOUNTER — Other Ambulatory Visit: Payer: Self-pay

## 2018-10-22 DIAGNOSIS — I10 Essential (primary) hypertension: Secondary | ICD-10-CM

## 2018-10-22 MED ORDER — VALSARTAN-HYDROCHLOROTHIAZIDE 80-12.5 MG PO TABS: 1.0000 | ORAL_TABLET | Freq: Every day | ORAL | 0 refills | Status: DC

## 2018-10-22 NOTE — Telephone Encounter (Signed)
Patient states he was suppose to have a follow up appointment today. He came into the office and there wasn't an appointment on the schedule. He needs his blood pressure medication. I did send in a refill and scheduled him for Wednesday.

## 2018-10-22 NOTE — Telephone Encounter (Signed)
Raymond Hardin came in for a appointment on 10/22/18. Stated by the notes he canceled his appointment by pressing "2" on the robot call.   When I tried to reschedule and ease the situation. Patient ended up using profanity and stated, "I will find a new doctor."  Please Advise.

## 2018-10-22 NOTE — Telephone Encounter (Signed)
I would see him today. If he would like. Im sure he made a mistake but we can't help we filled the spot. More than welcome to double book.

## 2018-10-24 ENCOUNTER — Encounter: Payer: Self-pay | Admitting: Physician Assistant

## 2018-10-24 ENCOUNTER — Ambulatory Visit (INDEPENDENT_AMBULATORY_CARE_PROVIDER_SITE_OTHER): Payer: Medicare HMO | Admitting: Physician Assistant

## 2018-10-24 VITALS — BP 138/77 | HR 104 | Temp 98.4°F | Ht 70.5 in | Wt 229.0 lb

## 2018-10-24 DIAGNOSIS — F411 Generalized anxiety disorder: Secondary | ICD-10-CM | POA: Diagnosis not present

## 2018-10-24 DIAGNOSIS — I10 Essential (primary) hypertension: Secondary | ICD-10-CM | POA: Diagnosis not present

## 2018-10-24 DIAGNOSIS — E782 Mixed hyperlipidemia: Secondary | ICD-10-CM | POA: Diagnosis not present

## 2018-10-24 DIAGNOSIS — R7303 Prediabetes: Secondary | ICD-10-CM | POA: Diagnosis not present

## 2018-10-24 MED ORDER — ATORVASTATIN CALCIUM 20 MG PO TABS: 20.0000 mg | ORAL_TABLET | Freq: Every day | ORAL | 1 refills | Status: DC

## 2018-10-24 MED ORDER — ALPRAZOLAM 0.5 MG PO TABS: 0.5000 mg | ORAL_TABLET | Freq: Every evening | ORAL | 5 refills | Status: DC | PRN

## 2018-10-25 NOTE — Progress Notes (Signed)
Subjective:    Patient ID: Raymond Hardin, male    DOB: 12-May-1940, 78 y.o.   MRN: [id number]  HPI  Pt is a 78 yo male with pre-diabetes, HTN, HLD who presents to the clinic for 1 month BP follow up.   He is doing well on diovan/HcTZ. No concerns. No CP, palpitations, headaches or vision changes.   He continues to use xanax regularly. He needs refill.   He would like written out all medications and what they are for.   .. Active Ambulatory Problems    Diagnosis Date Noted  . Erectile dysfunction 11/04/2011  . Tobacco abuse 12/12/2012  . Depression 12/12/2012  . Primary osteoarthritis of right knee 04/24/2013  . Vitamin D deficiency 08/13/2013  . Pre-diabetes 08/16/2013  . Status post left knee replacement 08/13/2014  . Anxiety state 08/13/2014  . Essential hypertension, benign 08/13/2014  . Hyperlipidemia 02/24/2015  . Hypertriglyceridemia 02/24/2015  . CKD (chronic kidney disease), stage III ([place]) 12/07/2015  . S/P total knee replacement, right 01/31/2017  . Bilateral cataracts 01/05/2018  . Posterior vitreous detachment of left eye 04/09/2018  . 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 S/P bilateral cataract extraction 05/25/2018  . Recurrent major depressive disorder, in partial remission ([place]) 06/19/2018  . Skipped heart beats 09/17/2018  . Strong pulse 09/17/2018  . Palpitation 09/19/2018   Resolved Ambulatory Problems    Diagnosis Date Noted  . Hypertension, essential, benign 11/04/2011   Past Medical History:  Diagnosis Date  . Hypertension   . Insomnia 2005  . Osteoarthritis 2000       Review of Systems  All other systems reviewed and are negative.      Objective:   Physical Exam Vitals signs reviewed.  Constitutional:      Appearance: Normal appearance. He is obese.  Cardiovascular:     Rate and Rhythm: Normal rate and regular rhythm.  Pulmonary:     Effort: Pulmonary effort is normal.     Breath sounds: Normal breath sounds.  Neurological:   General: No focal deficit present.     Mental Status: He is alert and oriented to person, place, and time.  Psychiatric:        Mood and Affect: Mood normal.           Assessment & Plan:  Marland KitchenMarland KitchenWilbon was seen today for hypertension.  Diagnoses and all orders for this visit:  Essential hypertension, benign  Anxiety state -     ALPRAZolam (XANAX) 0.5 MG tablet; Take 1 tablet (0.5 mg total) by mouth at bedtime as needed for anxiety.  Mixed hyperlipidemia -     atorvastatin (LIPITOR) 20 MG tablet; Take 1 tablet (20 mg total) by mouth daily.  Pre-diabetes   BP much better on diovan/HCTZ. Continue on same dose and recheck in 3 months.   Labs up to date for now. Refilled cholesterol medication.   .. Depression screen [facility] 2/9 10/24/2018 09/17/2018 06/18/2018 01/22/2018 01/05/2018  Decreased Interest 2 2 0 0 2  Down, Depressed, Hopeless 0 1 0 1 0  PHQ - 2 Score 2 3 0 1 2  Altered sleeping 1 0 0 0 3  Tired, decreased energy 1 1 1  - -  Change in appetite 2 0 0 0 2  Feeling bad or failure about yourself  1 2 0 0 2  Trouble concentrating 1 0 0 0 0  Moving slowly or fidgety/restless 1 1 1  0 0  Suicidal thoughts 0 0 0 0 0  PHQ-9 Score 9  7 2 1 9   Difficult doing work/chores Somewhat difficult Somewhat difficult Not difficult at all Not difficult at all Not difficult at all  Some recent data might be hidden   .Marland Kitchen GAD 7 : Generalized Anxiety Score 10/24/2018 09/17/2018 06/18/2018 01/05/2018  Nervous, Anxious, on Edge 1 1 2 1   Control/stop worrying 2 2 0 3  Worry too much - different things 1 3 3 3   Trouble relaxing 0 0 0 1  Restless 1 0 0 0  Easily annoyed or irritable 1 1 0 2  Afraid - awful might happen 1 0 0 2  Total GAD 7 Score 7 7 5 12   Anxiety Difficulty Somewhat difficult Somewhat difficult - Somewhat difficult    Continue on trintellix and xanax as needed.

## 2018-12-05 ENCOUNTER — Other Ambulatory Visit: Payer: Self-pay | Admitting: Physician Assistant

## 2018-12-05 DIAGNOSIS — I10 Essential (primary) hypertension: Secondary | ICD-10-CM

## 2019-01-25 ENCOUNTER — Ambulatory Visit (INDEPENDENT_AMBULATORY_CARE_PROVIDER_SITE_OTHER): Payer: Medicare HMO | Admitting: Physician Assistant

## 2019-01-25 ENCOUNTER — Encounter: Payer: Self-pay | Admitting: Physician Assistant

## 2019-01-25 ENCOUNTER — Other Ambulatory Visit: Payer: Self-pay

## 2019-01-25 VITALS — BP 143/74 | HR 90 | Ht 70.5 in | Wt 226.0 lb

## 2019-01-25 DIAGNOSIS — Z23 Encounter for immunization: Secondary | ICD-10-CM | POA: Diagnosis not present

## 2019-01-25 DIAGNOSIS — M545 Low back pain, unspecified: Secondary | ICD-10-CM

## 2019-01-25 DIAGNOSIS — I10 Essential (primary) hypertension: Secondary | ICD-10-CM

## 2019-01-25 MED ORDER — DICLOFENAC SODIUM 1 % TD GEL: 4.0000 g | Freq: Four times a day (QID) | TRANSDERMAL | 1 refills | Status: DC

## 2019-01-25 MED ORDER — TRAMADOL HCL 50 MG PO TABS: 50.0000 mg | ORAL_TABLET | Freq: Four times a day (QID) | ORAL | 0 refills | Status: AC | PRN

## 2019-01-25 MED ORDER — TIZANIDINE HCL 4 MG PO CAPS: 4.0000 mg | ORAL_CAPSULE | Freq: Three times a day (TID) | ORAL | 0 refills | Status: DC

## 2019-01-25 NOTE — Patient Instructions (Signed)

## 2019-01-25 NOTE — Progress Notes (Signed)
Subjective:    Patient ID: Raymond Hardin, male    DOB: Oct 24, 1940, 78 y.o.   MRN: [id number]  HPI  Pt is a 78 yo male with HTN who presents to the clinic with new back pain that started about 1 month ago after falling out of a office rolling chair at home.   Overall patient is doing well. No CP, SOB, headaches. He is taking his medication daily.   1 month ago he fell out of his office chair and landed on his buttocks and back. He has been sore since. He continues to have some pain. At times pain is worse than others. He denies any numbness or tingling running down legs or leg weakness. He has been taking ibuprofen with some benefit. He wonders if "he could have some pain medication to take as needed".   .. Active Ambulatory Problems    Diagnosis Date Noted  . Erectile dysfunction 11/04/2011  . Tobacco abuse 12/12/2012  . Depression 12/12/2012  . Primary osteoarthritis of right knee 04/24/2013  . Vitamin D deficiency 08/13/2013  . Pre-diabetes 08/16/2013  . Status post left knee replacement 08/13/2014  . Anxiety state 08/13/2014  . Essential hypertension, benign 08/13/2014  . Hyperlipidemia 02/24/2015  . Hypertriglyceridemia 02/24/2015  . CKD (chronic kidney disease), stage III ([place]) 12/07/2015  . S/P total knee replacement, right 01/31/2017  . Bilateral cataracts 01/05/2018  . Posterior vitreous detachment of left eye 04/09/2018  . 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 S/P bilateral cataract extraction 05/25/2018  . Recurrent major depressive disorder, in partial remission ([place]) 06/19/2018  . Skipped heart beats 09/17/2018  . Strong pulse 09/17/2018  . Palpitation 09/19/2018  . Acute bilateral low back pain without sciatica 01/28/2019   Resolved Ambulatory Problems    Diagnosis Date Noted  . Hypertension, essential, benign 11/04/2011   Past Medical History:  Diagnosis Date  . Hypertension   . Insomnia 2005  . Osteoarthritis 2000      Review of Systems    see HPI.   Objective:   Physical Exam Vitals signs reviewed.  Constitutional:      Appearance: Normal appearance.  Cardiovascular:     Rate and Rhythm: Normal rate and regular rhythm.     Pulses: Normal pulses.  Pulmonary:     Effort: Pulmonary effort is normal.     Breath sounds: Normal breath sounds.  Musculoskeletal:     Comments: No tenderness over lumbar spine to palpation.  NROM at waist.  Negative straight leg test.  Tightness and some tenderness over lumbar paraspinal muscles.   Neurological:     General: No focal deficit present.     Mental Status: He is alert and oriented to person, place, and time.  Psychiatric:        Mood and Affect: Mood normal.           Assessment & Plan:  Marland KitchenMarland KitchenToretto was seen today for back pain.  Diagnoses and all orders for this visit:  Acute bilateral low back pain without sciatica -     tiZANidine (ZANAFLEX) 4 MG capsule; Take 1 capsule (4 mg total) by mouth 3 (three) times daily. -     traMADol (ULTRAM) 50 MG tablet; Take 1 tablet (50 mg total) by mouth every 6 (six) hours as needed for up to 5 days. -     diclofenac sodium (VOLTAREN) 1 % GEL; Apply 4 g topically 4 (four) times daily. To affected joint.  Flu vaccine need -  Flu Vaccine QUAD High Dose(Fluad)  Essential hypertension, benign   BP controlled today.  Reassured patient no red flags today with back pain. If continues will get xray.  Due to CKD no NSAIDs. Did send some diclofenac gel to use up to 4 times a day.  Discussed icy hot patches.  Gave HO with exercises.  Muscle relaxer given to use as needed.  VERY small quantity of tramadol to use as needed. Discussed to NOT use with xanax. Pt is aware of increased risk of sudden death.    ..PDMP reviewed during this encounter.   Marland Kitchen.Spent 30 minutes with patient and greater than 50 percent of visit spent counseling patient regarding treatment plan.

## 2019-01-28 ENCOUNTER — Ambulatory Visit: Payer: Medicare HMO

## 2019-01-28 DIAGNOSIS — M545 Low back pain, unspecified: Secondary | ICD-10-CM | POA: Insufficient documentation

## 2019-01-30 ENCOUNTER — Other Ambulatory Visit: Payer: Self-pay | Admitting: Physician Assistant

## 2019-01-30 MED ORDER — BACLOFEN 10 MG PO TABS: 10.0000 mg | ORAL_TABLET | Freq: Three times a day (TID) | ORAL | 0 refills | Status: DC

## 2019-01-30 NOTE — Progress Notes (Signed)
Tizanidine not covered. Sent baclofen.

## 2019-02-04 ENCOUNTER — Other Ambulatory Visit: Payer: Self-pay

## 2019-02-04 ENCOUNTER — Ambulatory Visit (INDEPENDENT_AMBULATORY_CARE_PROVIDER_SITE_OTHER): Payer: Medicare HMO | Admitting: *Deleted

## 2019-02-04 VITALS — BP 148/78 | HR 93 | Temp 98.5°F | Ht 70.0 in | Wt 229.0 lb

## 2019-02-04 DIAGNOSIS — Z0000 Encounter for general adult medical examination without abnormal findings: Secondary | ICD-10-CM

## 2019-02-04 NOTE — Patient Instructions (Signed)
Raymond Hardin , Thank you for taking time to come for your Medicare Wellness Visit. I appreciate your ongoing commitment to your health goals. Please review the following plan we discussed and let me know if I can assist you in the future.  Please schedule your next medicare wellness visit with me in 1 yr.  These are the goals we discussed: Goals    . DIET - INCREASE WATER INTAKE     Goal is to drink at least 32 ounces of water a day. Watch salt intake daily.    Marland Kitchen DIET - INCREASE WATER INTAKE     Increase water intake to at least 3 bottles a day to start

## 2019-02-04 NOTE — Progress Notes (Signed)
Subjective:   Raymond Hardin is a 78 y.o. male who presents for Medicare Annual/Subsequent preventive examination.  Review of Systems:  No ROS.  Medicare Wellness Virtual Visit.  Visual/audio telehealth visit, UTA vital signs.   See social history for additional risk factors.    Cardiac Risk Factors include: advanced age (>69men, >3 women);sedentary lifestyle;smoking/ tobacco exposure;dyslipidemia;male gender Sleep patterns: Getting 6-10 hours of sleep a night. Does not wake up during the night to void. Wakes up and feels refreshed and ready to go for the day.   Home Safety/Smoke Alarms: Feels safe in home. Smoke alarms in place.  Living environment; Lives alone in a 1 story apartment. No stairs in the apartment. Shower is a step over tub and grab bars are in place. Seat Belt Safety/Bike Helmet: Wears seat belt.    Male:   CCS-  Aged out   PSA- No results found for: PSA       Objective:    Vitals: BP (!) 148/78   Pulse 93   Temp 98.5 F (36.9 C) (Oral)   Ht 5\' 10"  (1.778 m)   Wt 229 lb (103.9 kg)   SpO2 97%   BMI 32.86 kg/m   Body mass index is 32.86 kg/m.  Advanced Directives 02/04/2019 01/22/2018  Does Patient Have a Medical Advance Directive? No No  Would patient like information on creating a medical advance directive? No - Patient declined Yes (MAU/Ambulatory/Procedural Areas - Information given)    Tobacco Social History   Tobacco Use  Smoking Status Current Every Day Smoker  . Packs/day: 0.50  . Years: 25.00  . Pack years: 12.50  . Types: Cigarettes  . Last attempt to quit: 08/10/2015  . Years since quitting: 3.4  Smokeless Tobacco Never Used     Ready to quit: No Counseling given: Not Answered   Clinical Intake:  Pre-visit preparation completed: Yes  Pain : No/denies pain     Nutritional Risks: None Diabetes: No  How often do you need to have someone help you when you read instructions, pamphlets, or other written materials from your  doctor or pharmacy?: 1 - Never What is the last grade level you completed in school?: 12  Interpreter Needed?: No  Information entered by :: Orlie Dakin, LPN  Past Medical History:  Diagnosis Date  . Depression   . Hyperlipidemia   . Hypertension   . Insomnia 2005  . Osteoarthritis 2000   Past Surgical History:  Procedure Laterality Date  . left shoulder replacement  2011  . REPLACEMENT TOTAL KNEE Bilateral    Family History  Problem Relation Age of Onset  . Cancer Mother   . Suicidality Father 11  . Depression Father   . COPD Sister    Social History   Socioeconomic History  . Marital status: Legally Separated    Spouse name: Not on file  . Number of children: 1  . Years of education: 73  . Highest education level: 12th grade  Occupational History  . 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 Financial resource strain: Not hard at all  . Food insecurity    Worry: Never true    Inability: Never true  . Transportation needs    Medical: No    Non-medical: No  Tobacco Use  . Smoking status: Current Every Day Smoker    Packs/day: 0.50    Years: 25.00    Pack years: 12.50    Types: Cigarettes  Last attempt to quit: 08/10/2015    Years since quitting: 3.4  . Smokeless tobacco: Never Used  Substance and Sexual Activity  . Alcohol use: Yes    Alcohol/week: 0.0 standard drinks    Comment: occasional  . Drug use: No  . Sexual activity: Yes    Partners: Female  Lifestyle  . Physical activity    Days per week: 3 days    Minutes per session: 30 min  . Stress: Not at all  Relationships  . Social connections    Talks on phone: More than three times a week    Gets together: Once a week    Attends religious service: More than 4 times per year    Active member of club or organization: No    Attends meetings of clubs or organizations: Never    Relationship status: Separated  Other Topics Concern  . Not on file  Social History Narrative    Seperated.   Does charity rides for needy    Outpatient Encounter Medications as of 02/04/2019  Medication Sig  . ALPRAZolam (XANAX) 0.5 MG tablet Take 1 tablet (0.5 mg total) by mouth at bedtime as needed for anxiety.  Marland Kitchen amLODipine (NORVASC) 10 MG tablet Take 1 tablet (10 mg total) by mouth daily.  Marland Kitchen atorvastatin (LIPITOR) 20 MG tablet Take 1 tablet (20 mg total) by mouth daily.  . baclofen (LIORESAL) 10 MG tablet Take 1 tablet (10 mg total) by mouth 3 (three) times daily.  . busPIRone (BUSPAR) 10 MG tablet Take 1 tablet (10 mg total) by mouth 3 (three) times daily.  . cholecalciferol (VITAMIN D) 1000 units tablet Take 1,000 Units by mouth daily.  . diclofenac sodium (VOLTAREN) 1 % GEL Apply 4 g topically 4 (four) times daily. To affected joint.  . metFORMIN (GLUCOPHAGE) 500 MG tablet Take 1 tablet (500 mg total) by mouth 2 (two) times daily with a meal.  . sildenafil (REVATIO) 20 MG tablet TAKE 5 TABLETS BY MOUTH AS NEEDED FOR ERECTILE DYSFUNCTION( PRIOR TO SEXUAL ACTIVITY)  . tiZANidine (ZANAFLEX) 4 MG capsule Take 1 capsule (4 mg total) by mouth 3 (three) times daily.  . TRINTELLIX 10 MG TABS tablet TAKE 1 TABLET(10 MG) BY MOUTH DAILY  . valsartan-hydrochlorothiazide (DIOVAN-HCT) 80-12.5 MG tablet Take 1 tablet by mouth daily.   No facility-administered encounter medications on file as of 02/04/2019.     Activities of Daily Living In your present state of health, do you have any difficulty performing the following activities: 02/04/2019  Hearing? N  Vision? N  Difficulty concentrating or making decisions? Y  Comment sometimes concentrating is a problem at times  Walking or climbing stairs? N  Dressing or bathing? N  Doing errands, shopping? N  Preparing Food and eating ? N  Using the Toilet? N  In the past six months, have you accidently leaked urine? N  Do you have problems with loss of bowel control? N  Managing your Medications? N  Managing your Finances? N  Housekeeping or  managing your Housekeeping? N  Some recent data might be hidden    Patient Care Team: Lavada Mesi as PCP - General (Family Medicine)   Assessment:   This is a routine wellness examination for [place].Physical assessment deferred to PCP.   Exercise Activities and Dietary recommendations Current Exercise Habits: Home exercise routine, Type of exercise: walking, Time (Minutes): 30, Frequency (Times/Week): 3, Weekly Exercise (Minutes/Week): 90, Intensity: Mild, Exercise limited by: None identified Diet eaats a fairly  healthy diet of vegetables, fruits and proteins.  Breakfast:Gravy biscuit or eggs Lunch: bbq sandwich or frozen dinner Dinner: chicken, vegetables. Water intake - none at all. Advised to start drinking more water.      Goals    . DIET - INCREASE WATER INTAKE     Goal is to drink at least 32 ounces of water a day. Watch salt intake daily.    Marland Kitchen DIET - INCREASE WATER INTAKE     Increase water intake to at least 3 bottles a day to start       Fall Risk Fall Risk  02/04/2019 01/22/2018  Falls in the past year? 0 No  Number falls in past yr: 0 -  Injury with Fall? 0 -  Follow up Falls prevention discussed -   Is the patient's home free of loose throw rugs in walkways, pet beds, electrical cords, etc?   yes      Grab bars in the bathroom? yes      Handrails on the stairs?   no      Adequate lighting?   yes   Depression Screen PHQ 2/9 Scores 02/04/2019 10/24/2018 09/17/2018 06/18/2018  PHQ - 2 Score 0 2 3 0  PHQ- 9 Score - 9 7 2   Exception Documentation - - - -    Cognitive Function     6CIT Screen 02/04/2019 01/22/2018  What Year? 0 points 0 points  What month? 0 points 0 points  What time? 0 points 0 points  Count back from 20 0 points 0 points  Months in reverse 0 points 2 points  Repeat phrase 0 points 0 points  Total Score 0 2    Immunization History  Administered Date(s) Administered  . Fluad Quad(high Dose 65+) 01/25/2019  . Influenza,inj,Quad  PF,6+ Mos 01/23/2013, 02/23/2015, 03/01/2016, 01/31/2017, 01/05/2018  . Pneumococcal Conjugate-13 08/13/2014  . Pneumococcal Polysaccharide-23 07/01/2011  . Tdap 01/05/2018    Screening Tests Health Maintenance  Topic Date Due  . TETANUS/TDAP  01/06/2028  . INFLUENZA VACCINE  Completed  . PNA vac Low Risk Adult  Completed        Plan:      Mr. Jimeno , Thank you for taking time to come for your Medicare Wellness Visit. I appreciate your ongoing commitment to your health goals. Please review the following plan we discussed and let me know if I can assist you in the future.  Please schedule your next medicare wellness visit with me in 1 yr.   These are the goals we discussed: Goals    . DIET - INCREASE WATER INTAKE     Goal is to drink at least 32 ounces of water a day. Watch salt intake daily.    Marland Kitchen DIET - INCREASE WATER INTAKE     Increase water intake to at least 3 bottles a day to start       This is a list of the screening recommended for you and due dates:  Health Maintenance  Topic Date Due  . Tetanus Vaccine  01/06/2028  . Flu Shot  Completed  . Pneumonia vaccines  Completed     I have personally reviewed and noted the following in the patient's chart:   . Medical and social history . Use of alcohol, tobacco or illicit drugs  . Current medications and supplements . Functional ability and status . Nutritional status . Physical activity . Advanced directives . List of other physicians . Hospitalizations, surgeries, and ER visits in previous 11  months . Vitals . Screenings to include cognitive, depression, and falls . Referrals and appointments  In addition, I have reviewed and discussed with patient certain preventive protocols, quality metrics, and best practice recommendations. A written personalized care plan for preventive services as well as general preventive health recommendations were provided to patient.     Joanne Chars,  LPN  [id number]

## 2019-03-08 ENCOUNTER — Other Ambulatory Visit: Payer: Self-pay | Admitting: Physician Assistant

## 2019-03-08 DIAGNOSIS — R7303 Prediabetes: Secondary | ICD-10-CM

## 2019-03-10 DIAGNOSIS — U071 COVID-19: Secondary | ICD-10-CM

## 2019-03-10 HISTORY — DX: COVID-19: U07.1

## 2019-03-25 ENCOUNTER — Other Ambulatory Visit: Payer: Self-pay | Admitting: Physician Assistant

## 2019-03-25 DIAGNOSIS — F411 Generalized anxiety disorder: Secondary | ICD-10-CM

## 2019-03-25 DIAGNOSIS — F3341 Major depressive disorder, recurrent, in partial remission: Secondary | ICD-10-CM

## 2019-03-30 ENCOUNTER — Emergency Department (INDEPENDENT_AMBULATORY_CARE_PROVIDER_SITE_OTHER)
Admission: EM | Admit: 2019-03-30 | Discharge: 2019-03-30 | Disposition: A | Discharge status: Home / Self Care | Payer: Medicare HMO | Source: Home / Self Care | Attending: Family Medicine | Admitting: Family Medicine

## 2019-03-30 ENCOUNTER — Other Ambulatory Visit: Payer: Self-pay

## 2019-03-30 DIAGNOSIS — R112 Nausea with vomiting, unspecified: Secondary | ICD-10-CM | POA: Diagnosis not present

## 2019-03-30 DIAGNOSIS — R197 Diarrhea, unspecified: Secondary | ICD-10-CM | POA: Diagnosis not present

## 2019-03-30 MED ORDER — ACETAMINOPHEN 325 MG PO TABS
650.0000 mg | ORAL_TABLET | Freq: Once | ORAL | Status: AC
  Administered 2019-03-30: 650 mg via ORAL

## 2019-03-30 MED ORDER — ONDANSETRON 4 MG PO TBDP
4.0000 mg | ORAL_TABLET | Freq: Once | ORAL | Status: AC
  Administered 2019-03-30: 09:00:00 4 mg via ORAL

## 2019-03-30 MED ORDER — ONDANSETRON 4 MG PO TBDP: ORAL_TABLET | ORAL | 0 refills | Status: DC

## 2019-03-30 NOTE — Discharge Instructions (Addendum)
Begin clear liquids for 24 hours (take Pedialyte while having diarrhea) until improved, then advance to a [employer] (Bananas, Rice, Applesauce, Toast) for 24 hours.  Then gradually resume a regular diet when tolerated.  Avoid milk products until well.     If symptoms become significantly worse during the night or over the weekend, proceed to the local emergency room.

## 2019-03-30 NOTE — ED Provider Notes (Signed)
[facility]    CSN: [id number] Arrival date & time: 03/30/19  [id number]      History   Chief Complaint Chief Complaint  Patient presents with  . Nausea  . Emesis  . Diarrhea    HPI Raymond Hardin is a 78 y.o. male.   After eating two days ago patient developed nausea/vomiting and diarrhea.   He has had 3 episodes of diarrhea today.  He denies respiratory symptoms, changes in taste/smell,  GU symptoms, and abdominal pain.  The history is provided by the patient.  Diarrhea Quality:  Watery Severity:  Mild Onset quality:  Sudden Duration:  2 days Timing:  Intermittent Progression:  Improving Relieved by:  None tried Exacerbated by: eating. Ineffective treatments:  None tried Associated symptoms: chills and vomiting   Associated symptoms: no abdominal pain, no arthralgias, no recent cough, no headaches, no myalgias and no URI   Risk factors: suspect food intake   Risk factors: no recent antibiotic use and no sick contacts     Past Medical History:  Diagnosis Date  . Depression   . Hyperlipidemia   . Hypertension   . Insomnia 2005  . Osteoarthritis 2000    Patient Active Problem List   Diagnosis Date Noted  . Acute bilateral low back pain without sciatica 01/28/2019  . Palpitation 09/19/2018  . Skipped heart beats 09/17/2018  . Strong pulse 09/17/2018  . Recurrent major depressive disorder, in partial remission ([place]) 06/19/2018  . S/P bilateral cataract extraction 05/25/2018  . Posterior vitreous detachment of left eye 04/09/2018  . 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 Bilateral cataracts 01/05/2018  . S/P total knee replacement, right 01/31/2017  . CKD (chronic kidney disease), stage III 12/07/2015  . Hyperlipidemia 02/24/2015  . Hypertriglyceridemia 02/24/2015  . Status post left knee replacement 08/13/2014  . Anxiety state 08/13/2014  . Essential hypertension, benign 08/13/2014  . Pre-diabetes 08/16/2013  . Vitamin D deficiency 08/13/2013  .  Primary osteoarthritis of right knee 04/24/2013  . Tobacco abuse 12/12/2012  . Depression 12/12/2012  . Erectile dysfunction 11/04/2011    Past Surgical History:  Procedure Laterality Date  . left shoulder replacement  2011  . REPLACEMENT TOTAL KNEE Bilateral        Home Medications    Prior to Admission medications   Medication Sig Start Date End Date Taking? Authorizing Provider  ALPRAZolam Duanne Moron) 0.5 MG tablet Take 1 tablet (0.5 mg total) by mouth at bedtime as needed for anxiety. 10/24/18   Breeback, Jade L, PA-C  amLODipine (NORVASC) 10 MG tablet Take 1 tablet (10 mg total) by mouth daily. 06/18/18   Breeback, Jade L, PA-C  atorvastatin (LIPITOR) 20 MG tablet Take 1 tablet (20 mg total) by mouth daily. 10/24/18   Breeback, Jade L, PA-C  baclofen (LIORESAL) 10 MG tablet Take 1 tablet (10 mg total) by mouth 3 (three) times daily. 01/30/19   Breeback, Jade L, PA-C  busPIRone (BUSPAR) 10 MG tablet Take 1 tablet (10 mg total) by mouth 3 (three) times daily. 06/18/18   Breeback, Royetta Car, PA-C  cholecalciferol (VITAMIN D) 1000 units tablet Take 1,000 Units by mouth daily.    [provider]  diclofenac sodium (VOLTAREN) 1 % GEL Apply 4 g topically 4 (four) times daily. To affected joint. 01/25/19   Breeback, Royetta Car, PA-C  metFORMIN (GLUCOPHAGE) 500 MG tablet Take 1 tablet (500 mg total) by mouth 2 (two) times daily with a meal. NEEDS APPT/LABS 03/08/19   Donella Stade, PA-C  ondansetron (ZOFRAN ODT) 4 MG disintegrating tablet Take one tab by mouth Q6hr prn nausea.  Dissolve under tongue. 03/30/19   Kandra Nicolas, MD  sildenafil (REVATIO) 20 MG tablet TAKE 5 TABLETS BY MOUTH AS NEEDED FOR ERECTILE DYSFUNCTION( PRIOR TO SEXUAL ACTIVITY) 06/20/18   Breeback, Jade L, PA-C  tiZANidine (ZANAFLEX) 4 MG capsule Take 1 capsule (4 mg total) by mouth 3 (three) times daily. 01/25/19   Breeback, Jade L, PA-C  TRINTELLIX 10 MG TABS tablet TAKE 1 TABLET(10 MG) BY MOUTH DAILY 03/25/19   Breeback,  Jade L, PA-C  valsartan-hydrochlorothiazide (DIOVAN-HCT) 80-12.5 MG tablet Take 1 tablet by mouth daily. 10/22/18   Donella Stade, PA-C    Family History Family History  Problem Relation Age of Onset  . Cancer Mother   . Suicidality Father 68  . Depression Father   . COPD Sister     Social History Social History   Tobacco Use  . Smoking status: Current Every Day Smoker    Packs/day: 0.50    Years: 25.00    Pack years: 12.50    Types: Cigarettes    Last attempt to quit: 08/10/2015    Years since quitting: 3.6  . Smokeless tobacco: Never Used  Substance Use Topics  . Alcohol use: Yes    Alcohol/week: 0.0 standard drinks    Comment: occasional  . Drug use: No     Allergies   Patient has no known allergies.   Review of Systems Review of Systems  Constitutional: Positive for chills and fatigue.  Gastrointestinal: Positive for diarrhea, nausea and vomiting. Negative for abdominal pain and blood in stool.  Musculoskeletal: Negative for arthralgias and myalgias.  Neurological: Negative for headaches.  All other systems reviewed and are negative.  No sore throat No cough No pleuritic pain No wheezing No nasal congestion No post-nasal drainage No sinus pain/pressure No itchy/red eyes No earache No hemoptysis No SOB ? fever, + chills + nausea + vomiting No abdominal pain + diarrhea No urinary symptoms No skin rash + fatigue No myalgias No headache   Physical Exam Triage Vital Signs ED Triage Vitals  Enc Vitals Group     BP 03/30/19 0855 (!) 150/80     Pulse Rate 03/30/19 0855 77     Resp --      Temp 03/30/19 0855 (!) 100.7 F (38.2 C)     Temp Source 03/30/19 0855 Oral     SpO2 03/30/19 0855 94 %     Weight 03/30/19 0858 228 lb (103.4 kg)     Height 03/30/19 0858 5' 10.5" (1.791 m)     Head Circumference --      Peak Flow --      Pain Score 03/30/19 0858 0     Pain Loc --      Pain Edu? --      Excl. in [place]? --    No data found.  Updated  Vital Signs BP (!) 150/80 (BP Location: Right Arm)   Pulse 77   Temp (!) 100.7 F (38.2 C) (Oral)   Ht 5' 10.5" (1.791 m)   Wt 103.4 kg   SpO2 94%   BMI 32.25 kg/m   Visual Acuity Right Eye Distance:   Left Eye Distance:   Bilateral Distance:    Right Eye Near:   Left Eye Near:    Bilateral Near:     Physical Exam Nursing notes and Vital Signs reviewed. Appearance:  Patient appears stated age, and in no  acute distress.    Eyes:  Pupils are equal, round, and reactive to light and accomodation.  Extraocular movement is intact.  Conjunctivae are not inflamed   Pharynx:  Normal; moist mucous membranes  Neck:  Supple.  No adenopathy Lungs:  Clear to auscultation.  Breath sounds are equal.  Moving air well. Heart:  Regular rate and rhythm without murmurs, rubs, or gallops.  Abdomen:  Nontender without masses or hepatosplenomegaly.  Bowel sounds are present.  No CVA or flank tenderness.  Extremities:  No edema.  Skin:  No rash present.     UC Treatments / Results  Labs (all labs ordered are listed, but only abnormal results are displayed) Labs Reviewed  COMPLETE METABOLIC PANEL WITH GFR  POCT CBC W AUTO DIFF ([facility]):  WBC 4.5; LY 24.9; MO 9.5; GR 65.6; Hgb 14.0; Platelets 122   POCT URINALYSIS DIP (MANUAL ENTRY)    EKG   Radiology No results found.  Procedures Procedures (including critical care time)  Medications Ordered in UC Medications  ondansetron (ZOFRAN-ODT) disintegrating tablet 4 mg (4 mg Oral Given 03/30/19 0906)  acetaminophen (TYLENOL) tablet 650 mg (650 mg Oral Given 03/30/19 0906)    Initial Impression / Assessment and Plan / UC Course  I have reviewed the triage vital signs and the nursing notes.  Pertinent labs & imaging results that were available during my care of the patient were reviewed by me and considered in my medical decision making (see chart for details).    Suspect viral gastroenteritis.  Normal WBC (4.5) reassuring.  Administered Zofran ODT 4mg  PO; given Rx for same. CMP pending. Followup with Family Doctor in about 4 to 5 days.    Final Clinical Impressions(s) / UC Diagnoses   Final diagnoses:  Nausea vomiting and diarrhea     Discharge Instructions     Begin clear liquids for 24 hours (take Pedialyte while having diarrhea) until improved, then advance to a [employer] (Bananas, Rice, Applesauce, Toast) for 24 hours.  Then gradually resume a regular diet when tolerated.  Avoid milk products until well.     If symptoms become significantly worse during the night or over the weekend, proceed to the local emergency room.     ED Prescriptions    Medication Sig Dispense Auth. Provider   ondansetron (ZOFRAN ODT) 4 MG disintegrating tablet Take one tab by mouth Q6hr prn nausea.  Dissolve under tongue. 12 tablet Kandra Nicolas, MD        Kandra Nicolas, MD 04/01/19 1236

## 2019-03-30 NOTE — ED Notes (Signed)
Pt unable to give urine sample in office. Took cup home to bring back when able.

## 2019-03-30 NOTE — ED Triage Notes (Signed)
Pt thinks he at some out of date chicken Thursday night. Has been vomiting and having diarrhea since. Pt does present with a temperature today. No meds taken.

## 2019-03-31 LAB — COMPLETE METABOLIC PANEL WITH GFR
AG Ratio: 1.5 (calc) (ref 1.0–2.5)
ALT: 11 U/L (ref 9–46)
AST: 22 U/L (ref 10–35)
Albumin: 3.9 g/dL (ref 3.6–5.1)
Alkaline phosphatase (APISO): 35 U/L (ref 35–144)
BUN/Creatinine Ratio: 15 (calc) (ref 6–22)
BUN: 23 mg/dL (ref 7–25)
CO2: 24 mmol/L (ref 20–32)
Calcium: 8 mg/dL — ABNORMAL LOW (ref 8.6–10.3)
Chloride: 103 mmol/L (ref 98–110)
Creat: 1.49 mg/dL — ABNORMAL HIGH (ref 0.70–1.18)
GFR, Est African American: 52 mL/min/{1.73_m2} — ABNORMAL LOW (ref >=60)
GFR, Est Non African American: 45 mL/min/{1.73_m2} — ABNORMAL LOW (ref >=60)
Globulin: 2.6 g/dL (calc) (ref 1.9–3.7)
Glucose, Bld: 112 mg/dL — ABNORMAL HIGH (ref 65–99)
Potassium: 3.7 mmol/L (ref 3.5–5.3)
Sodium: 139 mmol/L (ref 135–146)
Total Bilirubin: 0.4 mg/dL (ref 0.2–1.2)
Total Protein: 6.5 g/dL (ref 6.1–8.1)

## 2019-04-01 ENCOUNTER — Emergency Department (INDEPENDENT_AMBULATORY_CARE_PROVIDER_SITE_OTHER)
Admission: EM | Admit: 2019-04-01 | Discharge: 2019-04-01 | Disposition: A | Discharge status: Home / Self Care | Payer: Medicare HMO | Source: Home / Self Care

## 2019-04-01 ENCOUNTER — Telehealth: Payer: Self-pay | Admitting: Emergency Medicine

## 2019-04-01 ENCOUNTER — Other Ambulatory Visit: Payer: Self-pay

## 2019-04-01 DIAGNOSIS — R110 Nausea: Secondary | ICD-10-CM | POA: Diagnosis not present

## 2019-04-01 DIAGNOSIS — R112 Nausea with vomiting, unspecified: Secondary | ICD-10-CM | POA: Diagnosis not present

## 2019-04-01 DIAGNOSIS — R197 Diarrhea, unspecified: Secondary | ICD-10-CM

## 2019-04-01 LAB — POCT URINALYSIS DIP (MANUAL ENTRY)
Bilirubin, UA: NEGATIVE
Bilirubin, UA: NEGATIVE
Blood, UA: NEGATIVE
Blood, UA: NEGATIVE
Glucose, UA: NEGATIVE mg/dL
Glucose, UA: NEGATIVE mg/dL
Ketones, POC UA: NEGATIVE mg/dL
Ketones, POC UA: NEGATIVE mg/dL
Leukocytes, UA: NEGATIVE
Leukocytes, UA: NEGATIVE
Nitrite, UA: NEGATIVE
Nitrite, UA: NEGATIVE
Protein Ur, POC: 100 mg/dL — AB
Protein Ur, POC: 100 mg/dL — AB
Spec Grav, UA: >=1.03 — AB (ref 1.010–1.025)
Spec Grav, UA: >=1.03 — AB (ref 1.010–1.025)
Urobilinogen, UA: 0.2 E.U./dL
Urobilinogen, UA: 0.2 E.U./dL
pH, UA: 5.5 (ref 5.0–8.0)
pH, UA: 5.5 (ref 5.0–8.0)

## 2019-04-01 LAB — POCT CBC W AUTO DIFF (K'VILLE URGENT CARE)

## 2019-04-01 LAB — POCT FASTING CBG KUC MANUAL ENTRY: POCT Glucose (KUC): 105 mg/dL — AB (ref 70–99)

## 2019-04-01 MED ORDER — AZITHROMYCIN 250 MG PO TABS: 500.0000 mg | ORAL_TABLET | Freq: Every day | ORAL | 0 refills | Status: DC

## 2019-04-01 MED ORDER — ONDANSETRON 4 MG PO TBDP
4.0000 mg | ORAL_TABLET | Freq: Once | ORAL | Status: AC
  Administered 2019-04-01: 4 mg via ORAL

## 2019-04-01 MED ORDER — AZITHROMYCIN 250 MG PO TABS: 500.0000 mg | ORAL_TABLET | Freq: Every day | ORAL | 0 refills | Status: AC

## 2019-04-01 NOTE — Discharge Instructions (Signed)
°  Be sure to get a lot of rest and stay well hydrated with sports drinks, water, diluted juices, and clear sodas.  Avoid fried fatty food, spicy food, and milk as these foods can cause worsening stomach upset.   Please follow up with family medicine in 1-2 days for recheck of symptoms. Virtual visits should be offered.  Call 911 or have someone drive you to the hospital if symptoms worsening- severe abdominal pain, unable to keep down fluids, dizziness, passing out, or other new concerning symptoms develop.

## 2019-04-01 NOTE — ED Provider Notes (Signed)
[facility]    CSN: [id number] Arrival date & time: 04/01/19  1133      History   Chief Complaint No chief complaint on file.   HPI Raymond Hardin is a 78 y.o. male.   HPI Raymond Hardin is a 78 y.o. male presenting to UC with c/o nausea, vomiting and diarrhea that started about 4 days ago. Occasional mild abdominal cramping.  He was seen at [facility] 2 days ago for same, was prescribed Zofran but reports only mild temporary relief.   Pt is requesting a Covid test today due to low grade fever since onset of symptoms. Denies cough, congestion, chest tightness or SOB. No known sick contacts or recent travel.    Past Medical History:  Diagnosis Date  . Depression   . Hyperlipidemia   . Hypertension   . Insomnia 2005  . Osteoarthritis 2000    Patient Active Problem List   Diagnosis Date Noted  . Acute bilateral low back pain without sciatica 01/28/2019  . Palpitation 09/19/2018  . Skipped heart beats 09/17/2018  . Strong pulse 09/17/2018  . Recurrent major depressive disorder, in partial remission ([place]) 06/19/2018  . S/P bilateral cataract extraction 05/25/2018  . Posterior vitreous detachment of left eye 04/09/2018  . 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 Bilateral cataracts 01/05/2018  . S/P total knee replacement, right 01/31/2017  . CKD (chronic kidney disease), stage III 12/07/2015  . Hyperlipidemia 02/24/2015  . Hypertriglyceridemia 02/24/2015  . Status post left knee replacement 08/13/2014  . Anxiety state 08/13/2014  . Essential hypertension, benign 08/13/2014  . Pre-diabetes 08/16/2013  . Vitamin D deficiency 08/13/2013  . Primary osteoarthritis of right knee 04/24/2013  . Tobacco abuse 12/12/2012  . Depression 12/12/2012  . Erectile dysfunction 11/04/2011    Past Surgical History:  Procedure Laterality Date  . left shoulder replacement  2011  . REPLACEMENT TOTAL KNEE Bilateral        Home Medications    Prior to Admission medications    Medication Sig Start Date End Date Taking? Authorizing Provider  ALPRAZolam Duanne Moron) 0.5 MG tablet Take 1 tablet (0.5 mg total) by mouth at bedtime as needed for anxiety. 10/24/18   Breeback, Jade L, PA-C  amLODipine (NORVASC) 10 MG tablet Take 1 tablet (10 mg total) by mouth daily. 06/18/18   Breeback, Jade L, PA-C  atorvastatin (LIPITOR) 20 MG tablet Take 1 tablet (20 mg total) by mouth daily. 10/24/18   Breeback, Jade L, PA-C  azithromycin (ZITHROMAX) 250 MG tablet Take 2 tablets (500 mg total) by mouth daily for 3 days. 04/01/19 04/04/19  Noe Gens, PA-C  baclofen (LIORESAL) 10 MG tablet Take 1 tablet (10 mg total) by mouth 3 (three) times daily. 01/30/19   Breeback, Jade L, PA-C  busPIRone (BUSPAR) 10 MG tablet Take 1 tablet (10 mg total) by mouth 3 (three) times daily. 06/18/18   Breeback, Royetta Car, PA-C  cholecalciferol (VITAMIN D) 1000 units tablet Take 1,000 Units by mouth daily.    [provider]  diclofenac sodium (VOLTAREN) 1 % GEL Apply 4 g topically 4 (four) times daily. To affected joint. 01/25/19   Breeback, Royetta Car, PA-C  metFORMIN (GLUCOPHAGE) 500 MG tablet Take 1 tablet (500 mg total) by mouth 2 (two) times daily with a meal. NEEDS APPT/LABS 03/08/19   Breeback, Jade L, PA-C  ondansetron (ZOFRAN ODT) 4 MG disintegrating tablet Take one tab by mouth Q6hr prn nausea.  Dissolve under tongue. 03/30/19   Kandra Nicolas, MD  sildenafil (REVATIO) 20 MG tablet TAKE 5 TABLETS BY MOUTH AS NEEDED FOR ERECTILE DYSFUNCTION( PRIOR TO SEXUAL ACTIVITY) 06/20/18   Breeback, Jade L, PA-C  tiZANidine (ZANAFLEX) 4 MG capsule Take 1 capsule (4 mg total) by mouth 3 (three) times daily. 01/25/19   Breeback, Jade L, PA-C  TRINTELLIX 10 MG TABS tablet TAKE 1 TABLET(10 MG) BY MOUTH DAILY 03/25/19   Breeback, Jade L, PA-C  valsartan-hydrochlorothiazide (DIOVAN-HCT) 80-12.5 MG tablet Take 1 tablet by mouth daily. 10/22/18   Donella Stade, PA-C    Family History Family History  Problem Relation Age  of Onset  . Cancer Mother   . Suicidality Father 69  . Depression Father   . COPD Sister     Social History Social History   Tobacco Use  . Smoking status: Current Every Day Smoker    Packs/day: 0.50    Years: 25.00    Pack years: 12.50    Types: Cigarettes    Last attempt to quit: 08/10/2015    Years since quitting: 3.6  . Smokeless tobacco: Never Used  Substance Use Topics  . Alcohol use: Yes    Alcohol/week: 0.0 standard drinks    Comment: occasional  . Drug use: No     Allergies   Patient has no known allergies.   Review of Systems Review of Systems  Constitutional: Positive for fatigue. Negative for chills and fever.  HENT: Negative for congestion, ear pain, sore throat, trouble swallowing and voice change.   Respiratory: Negative for cough and shortness of breath.   Cardiovascular: Negative for chest pain and palpitations.  Gastrointestinal: Positive for abdominal pain (mild generalized cramping), diarrhea, nausea and vomiting.  Musculoskeletal: Negative for arthralgias, back pain and myalgias.  Skin: Negative for rash.  Neurological: Negative for dizziness, light-headedness and headaches.     Physical Exam Triage Vital Signs ED Triage Vitals  Enc Vitals Group     BP 04/01/19 1217 (!) 147/82     Pulse Rate 04/01/19 1217 99     Resp 04/01/19 1217 19     Temp 04/01/19 1217 99.1 F (37.3 C)     Temp Source 04/01/19 1217 Oral     SpO2 04/01/19 1217 95 %     Weight 04/01/19 1215 228 lb (103.4 kg)     Height --      Head Circumference --      Peak Flow --      Pain Score 04/01/19 1215 6     Pain Loc --      Pain Edu? --      Excl. in [place]? --    Orthostatic VS for the past 24 hrs:  BP- Lying Pulse- Lying BP- Sitting Pulse- Sitting BP- Standing at 0 minutes Pulse- Standing at 0 minutes  04/01/19 1425 144/81 91 143/90 97 149/89 98    Updated Vital Signs BP (!) 147/82 (BP Location: Left Arm)   Pulse 99   Temp 99.1 F (37.3 C) (Oral)   Resp 19   Wt  228 lb (103.4 kg)   SpO2 95%   BMI 32.25 kg/m   Visual Acuity Right Eye Distance:   Left Eye Distance:   Bilateral Distance:    Right Eye Near:   Left Eye Near:    Bilateral Near:     Physical Exam Vitals signs and nursing note reviewed.  Constitutional:      Appearance: Normal appearance. He is well-developed.  HENT:     Head: Normocephalic and atraumatic.  Right Ear: Tympanic membrane and ear canal normal.     Left Ear: Tympanic membrane and ear canal normal.     Nose: Nose normal.     Mouth/Throat:     Mouth: Mucous membranes are moist.     Pharynx: Oropharynx is clear.  Eyes:     General: No scleral icterus.    Extraocular Movements: Extraocular movements intact.  Neck:     Musculoskeletal: Normal range of motion.  Cardiovascular:     Rate and Rhythm: Normal rate and regular rhythm.  Pulmonary:     Effort: Pulmonary effort is normal. No respiratory distress.     Breath sounds: Normal breath sounds. No stridor. No wheezing or rales.  Abdominal:     General: There is no distension.     Palpations: Abdomen is soft.     Tenderness: There is no abdominal tenderness. There is no right CVA tenderness or left CVA tenderness.  Musculoskeletal: Normal range of motion.  Skin:    General: Skin is warm and dry.  Neurological:     Mental Status: He is alert and oriented to person, place, and time.  Psychiatric:        Behavior: Behavior normal.      UC Treatments / Results  Labs (all labs ordered are listed, but only abnormal results are displayed) Labs Reviewed  COMPLETE METABOLIC PANEL WITH GFR - Abnormal; Notable for the following components:      Result Value   Creat 1.49 (*)    GFR, Est Non African American 45 (*)    GFR, Est African American 52 (*)    Calcium 8.1 (*)    All other components within normal limits  POCT URINALYSIS DIP (MANUAL ENTRY) - Abnormal; Notable for the following components:   Clarity, UA cloudy (*)    Spec Grav, UA >=1.030 (*)     Protein Ur, POC =100 (*)    All other components within normal limits  POCT FASTING CBG [facility] MANUAL ENTRY - Abnormal; Notable for the following components:   POCT Glucose ([facility]) 105 (*)    All other components within normal limits  NOVEL CORONAVIRUS, NAA  LIPASE  HEPATITIS PANEL, ACUTE  POCT CBC W AUTO DIFF ([facility])    EKG   Radiology No results found.  Procedures Procedures (including critical care time)  Medications Ordered in UC Medications  ondansetron (ZOFRAN-ODT) disintegrating tablet 4 mg (4 mg Oral Given 04/01/19 1425)    Initial Impression / Assessment and Plan / UC Course  I have reviewed the triage vital signs and the nursing notes.  Pertinent labs & imaging results that were available during my care of the patient were reviewed by me and considered in my medical decision making (see chart for details).     Benign abdominal exam Reviewed medical records from 2 days ago Will repeat labs Will send in azithromycin to cover for possible infectious diarrhea given duration of symptoms.  AVS provided   Final Clinical Impressions(s) / UC Diagnoses   Final diagnoses:  Nausea  Nausea vomiting and diarrhea     Discharge Instructions      Be sure to get a lot of rest and stay well hydrated with sports drinks, water, diluted juices, and clear sodas.  Avoid fried fatty food, spicy food, and milk as these foods can cause worsening stomach upset.   Please follow up with family medicine in 1-2 days for recheck of symptoms. Virtual visits should be offered.  Call 911 or  have someone drive you to the hospital if symptoms worsening- severe abdominal pain, unable to keep down fluids, dizziness, passing out, or other new concerning symptoms develop.    ED Prescriptions    Medication Sig Dispense Auth. Provider   azithromycin (ZITHROMAX) 250 MG tablet  (Status: Discontinued) Take 2 tablets (500 mg total) by mouth daily for 3 days. 6 tablet Gerarda Fraction, Marua Qin O,  PA-C   azithromycin (ZITHROMAX) 250 MG tablet Take 2 tablets (500 mg total) by mouth daily for 3 days. 6 tablet Noe Gens, PA-C     PDMP not reviewed this encounter.   Noe Gens, [place] 04/02/19 [phone]

## 2019-04-01 NOTE — ED Notes (Signed)
Confirmation for STAT [id number] 3:12pm

## 2019-04-01 NOTE — ED Triage Notes (Signed)
Pt. Has complaints of diahera, no appetite, can only sleep for a few hrs at a time, fever since Friday.

## 2019-04-02 LAB — HEPATITIS PANEL, ACUTE
Hep A IgM: NONREACTIVE
Hep B C IgM: NONREACTIVE
Hepatitis B Surface Ag: NONREACTIVE
Hepatitis C Ab: NONREACTIVE
SIGNAL TO CUT-OFF: 0.03 (ref <1.00)

## 2019-04-02 LAB — COMPLETE METABOLIC PANEL WITH GFR
AG Ratio: 1.3 (calc) (ref 1.0–2.5)
ALT: 14 U/L (ref 9–46)
AST: 31 U/L (ref 10–35)
Albumin: 4 g/dL (ref 3.6–5.1)
Alkaline phosphatase (APISO): 36 U/L (ref 35–144)
BUN/Creatinine Ratio: 15 (calc) (ref 6–22)
BUN: 22 mg/dL (ref 7–25)
CO2: 27 mmol/L (ref 20–32)
Calcium: 8.1 mg/dL — ABNORMAL LOW (ref 8.6–10.3)
Chloride: 102 mmol/L (ref 98–110)
Creat: 1.49 mg/dL — ABNORMAL HIGH (ref 0.70–1.18)
GFR, Est African American: 52 mL/min/{1.73_m2} — ABNORMAL LOW (ref >=60)
GFR, Est Non African American: 45 mL/min/{1.73_m2} — ABNORMAL LOW (ref >=60)
Globulin: 3 g/dL (calc) (ref 1.9–3.7)
Glucose, Bld: 98 mg/dL (ref 65–99)
Potassium: 4 mmol/L (ref 3.5–5.3)
Sodium: 142 mmol/L (ref 135–146)
Total Bilirubin: 0.6 mg/dL (ref 0.2–1.2)
Total Protein: 7 g/dL (ref 6.1–8.1)

## 2019-04-02 LAB — LIPASE: Lipase: 40 U/L (ref 7–60)

## 2019-04-03 ENCOUNTER — Telehealth: Payer: Self-pay

## 2019-04-03 ENCOUNTER — Telehealth (HOSPITAL_COMMUNITY): Payer: Self-pay | Admitting: Emergency Medicine

## 2019-04-03 DIAGNOSIS — R110 Nausea: Secondary | ICD-10-CM | POA: Diagnosis not present

## 2019-04-03 DIAGNOSIS — I480 Paroxysmal atrial fibrillation: Secondary | ICD-10-CM | POA: Diagnosis not present

## 2019-04-03 DIAGNOSIS — J9601 Acute respiratory failure with hypoxia: Secondary | ICD-10-CM | POA: Diagnosis not present

## 2019-04-03 DIAGNOSIS — R0602 Shortness of breath: Secondary | ICD-10-CM | POA: Diagnosis not present

## 2019-04-03 DIAGNOSIS — I129 Hypertensive chronic kidney disease with stage 1 through stage 4 chronic kidney disease, or unspecified chronic kidney disease: Secondary | ICD-10-CM | POA: Diagnosis not present

## 2019-04-03 DIAGNOSIS — R2243 Localized swelling, mass and lump, lower limb, bilateral: Secondary | ICD-10-CM | POA: Diagnosis not present

## 2019-04-03 DIAGNOSIS — E1122 Type 2 diabetes mellitus with diabetic chronic kidney disease: Secondary | ICD-10-CM | POA: Diagnosis not present

## 2019-04-03 DIAGNOSIS — R000 Tachycardia, unspecified: Secondary | ICD-10-CM | POA: Diagnosis not present

## 2019-04-03 DIAGNOSIS — G9349 Other encephalopathy: Secondary | ICD-10-CM | POA: Diagnosis not present

## 2019-04-03 DIAGNOSIS — R918 Other nonspecific abnormal finding of lung field: Secondary | ICD-10-CM | POA: Diagnosis not present

## 2019-04-03 DIAGNOSIS — I10 Essential (primary) hypertension: Secondary | ICD-10-CM | POA: Diagnosis not present

## 2019-04-03 DIAGNOSIS — I2510 Atherosclerotic heart disease of native coronary artery without angina pectoris: Secondary | ICD-10-CM | POA: Diagnosis not present

## 2019-04-03 DIAGNOSIS — E119 Type 2 diabetes mellitus without complications: Secondary | ICD-10-CM | POA: Diagnosis not present

## 2019-04-03 DIAGNOSIS — G9340 Encephalopathy, unspecified: Secondary | ICD-10-CM | POA: Diagnosis not present

## 2019-04-03 DIAGNOSIS — A0839 Other viral enteritis: Secondary | ICD-10-CM | POA: Diagnosis not present

## 2019-04-03 DIAGNOSIS — E669 Obesity, unspecified: Secondary | ICD-10-CM | POA: Diagnosis not present

## 2019-04-03 DIAGNOSIS — U071 COVID-19: Secondary | ICD-10-CM | POA: Diagnosis not present

## 2019-04-03 DIAGNOSIS — J1289 Other viral pneumonia: Secondary | ICD-10-CM | POA: Diagnosis not present

## 2019-04-03 DIAGNOSIS — Z96651 Presence of right artificial knee joint: Secondary | ICD-10-CM | POA: Diagnosis not present

## 2019-04-03 DIAGNOSIS — I059 Rheumatic mitral valve disease, unspecified: Secondary | ICD-10-CM | POA: Diagnosis not present

## 2019-04-03 LAB — NOVEL CORONAVIRUS, NAA: SARS-CoV-2, NAA: DETECTED — AB

## 2019-04-03 NOTE — Telephone Encounter (Signed)
Contacted patient about his positive covid results. Pt could barely speak to me on the phone, he was so short of breath. Pt seemed to be breathing very very quickly. Discussed with patient that he needs evaluation, pt tried speak and say that he would call a friend to see if they could come, but he had trouble expressing this. I informed patient that I am going to contact EMS to come see him and check his vitals since he may need to be seen in the ER. Pt verbalized understanding. Called [facility] 911 and sent ems dispatch to his house.

## 2019-04-03 NOTE — Telephone Encounter (Signed)
Pt states not much improved since visit. Advised to pick up meds from [facility]. And to call if any questions. F/u with PCP soon for re eval.

## 2019-04-18 ENCOUNTER — Telehealth: Payer: Self-pay | Admitting: Physician Assistant

## 2019-04-18 NOTE — Telephone Encounter (Signed)
Patient was seen at [facility] in [place]. Patient thought he had covid but it was pneumonia. Patient needs a hospital follow up. Okay for in person or virtual? Was in hospital for a week.

## 2019-04-19 NOTE — Telephone Encounter (Signed)
Patient said that he has been home since 04/12/19. He said they took xrays but doesn't remember anything about a COVID test and doesn't know if it was negative. He said he hasn't talked to any doctors.

## 2019-04-19 NOTE — Telephone Encounter (Signed)
He is positive for COVID. We can not see him in office until 14 days after symptoms. I can do a virtual to check up on him.

## 2019-04-19 NOTE — Telephone Encounter (Signed)
If he was covid negative then ok to schedule within 7 days of his discharge from hospital.

## 2019-04-23 ENCOUNTER — Other Ambulatory Visit: Payer: Self-pay

## 2019-04-23 DIAGNOSIS — F411 Generalized anxiety disorder: Secondary | ICD-10-CM

## 2019-04-23 MED ORDER — ALPRAZOLAM 0.5 MG PO TABS: 0.5000 mg | ORAL_TABLET | Freq: Every evening | ORAL | 1 refills | Status: DC | PRN

## 2019-04-23 NOTE — Addendum Note (Signed)
Addended by: Narda Rutherford on: 04/23/2019 02:57 PM   Modules accepted: Orders

## 2019-04-23 NOTE — Telephone Encounter (Signed)
Appointment has been made. No further questions at this time.  

## 2019-04-23 NOTE — Telephone Encounter (Signed)
Sorry, I didn't notice the pharmacy. It needs to go to [facility].

## 2019-04-23 NOTE — Telephone Encounter (Signed)
Raymond Hardin needs a refill on Alprazolam.

## 2019-04-26 ENCOUNTER — Inpatient Hospital Stay: Payer: Medicare HMO | Admitting: Physician Assistant

## 2019-05-13 ENCOUNTER — Other Ambulatory Visit: Payer: Self-pay

## 2019-05-13 ENCOUNTER — Ambulatory Visit (INDEPENDENT_AMBULATORY_CARE_PROVIDER_SITE_OTHER): Payer: Medicare HMO

## 2019-05-13 ENCOUNTER — Encounter: Payer: Self-pay | Admitting: Physician Assistant

## 2019-05-13 ENCOUNTER — Ambulatory Visit (INDEPENDENT_AMBULATORY_CARE_PROVIDER_SITE_OTHER): Payer: Medicare HMO | Admitting: Physician Assistant

## 2019-05-13 VITALS — BP 160/84 | HR 106 | Ht 70.5 in | Wt 227.0 lb

## 2019-05-13 DIAGNOSIS — E782 Mixed hyperlipidemia: Secondary | ICD-10-CM | POA: Diagnosis not present

## 2019-05-13 DIAGNOSIS — Z7901 Long term (current) use of anticoagulants: Secondary | ICD-10-CM

## 2019-05-13 DIAGNOSIS — I10 Essential (primary) hypertension: Secondary | ICD-10-CM

## 2019-05-13 DIAGNOSIS — E1122 Type 2 diabetes mellitus with diabetic chronic kidney disease: Secondary | ICD-10-CM | POA: Diagnosis not present

## 2019-05-13 DIAGNOSIS — J449 Chronic obstructive pulmonary disease, unspecified: Secondary | ICD-10-CM

## 2019-05-13 DIAGNOSIS — R0602 Shortness of breath: Secondary | ICD-10-CM | POA: Diagnosis not present

## 2019-05-13 DIAGNOSIS — E876 Hypokalemia: Secondary | ICD-10-CM | POA: Insufficient documentation

## 2019-05-13 DIAGNOSIS — J9601 Acute respiratory failure with hypoxia: Secondary | ICD-10-CM | POA: Diagnosis not present

## 2019-05-13 DIAGNOSIS — Z6832 Body mass index (BMI) 32.0-32.9, adult: Secondary | ICD-10-CM

## 2019-05-13 DIAGNOSIS — J189 Pneumonia, unspecified organism: Secondary | ICD-10-CM

## 2019-05-13 DIAGNOSIS — U071 COVID-19: Secondary | ICD-10-CM

## 2019-05-13 DIAGNOSIS — I129 Hypertensive chronic kidney disease with stage 1 through stage 4 chronic kidney disease, or unspecified chronic kidney disease: Secondary | ICD-10-CM | POA: Diagnosis not present

## 2019-05-13 DIAGNOSIS — J1282 Pneumonia due to coronavirus disease 2019: Secondary | ICD-10-CM

## 2019-05-13 DIAGNOSIS — E669 Obesity, unspecified: Secondary | ICD-10-CM

## 2019-05-13 DIAGNOSIS — G9340 Encephalopathy, unspecified: Secondary | ICD-10-CM

## 2019-05-13 DIAGNOSIS — N183 Chronic kidney disease, stage 3 unspecified: Secondary | ICD-10-CM

## 2019-05-13 DIAGNOSIS — Z794 Long term (current) use of insulin: Secondary | ICD-10-CM

## 2019-05-13 DIAGNOSIS — F3341 Major depressive disorder, recurrent, in partial remission: Secondary | ICD-10-CM

## 2019-05-13 DIAGNOSIS — I480 Paroxysmal atrial fibrillation: Secondary | ICD-10-CM | POA: Diagnosis not present

## 2019-05-13 DIAGNOSIS — R7303 Prediabetes: Secondary | ICD-10-CM

## 2019-05-13 DIAGNOSIS — Z79899 Other long term (current) drug therapy: Secondary | ICD-10-CM

## 2019-05-13 DIAGNOSIS — F411 Generalized anxiety disorder: Secondary | ICD-10-CM

## 2019-05-13 DIAGNOSIS — N182 Chronic kidney disease, stage 2 (mild): Secondary | ICD-10-CM

## 2019-05-13 MED ORDER — METFORMIN HCL 500 MG PO TABS: 500.0000 mg | ORAL_TABLET | Freq: Two times a day (BID) | ORAL | 0 refills | Status: DC

## 2019-05-13 MED ORDER — ATORVASTATIN CALCIUM 20 MG PO TABS: 20.0000 mg | ORAL_TABLET | Freq: Every day | ORAL | 1 refills | Status: DC

## 2019-05-13 MED ORDER — BUSPIRONE HCL 10 MG PO TABS: 10.0000 mg | ORAL_TABLET | Freq: Three times a day (TID) | ORAL | 1 refills | Status: DC

## 2019-05-13 MED ORDER — VORTIOXETINE HBR 10 MG PO TABS: ORAL_TABLET | ORAL | 2 refills | Status: DC

## 2019-05-13 MED ORDER — VALSARTAN-HYDROCHLOROTHIAZIDE 80-12.5 MG PO TABS: 1.0000 | ORAL_TABLET | Freq: Every day | ORAL | 0 refills | Status: DC

## 2019-05-13 NOTE — Progress Notes (Signed)
Subjective:    Patient ID: Raymond Hardin, male    DOB: Sep 01, 1940, 79 y.o.   MRN: [id number]  HPI  Pt is a 79 yo obese male and current smoker with HTN, pre-diabetes, CKD, HLD, MDD, GAD who presents to the clinic to follow up after recent hospitalization after covid dx and new onset PAF, encephalopy and bilateral pneumonia. He went to the UC on 11/23 for nausea and diarrhea. He was treated symptomatically. On 11/25 went to ED for cough, confusion, fever. He was discharged on 12/4.   See Hospital A and P:  Acute hypoxic respiratory failure- improved. PO2 60 at presentation. He required heated high flow oxygen. Currently POX 82 RA, 96 on 1L. CTPA did not show PE. Ordered O2 at 2-LPM with exertion.  Newly identified paroxysmal atrial fibrillation- currently rate controlled on Lopressor. CTPA did not show PE, Dopplers did not show DVT. TTE normal. Continue therapeutic enoxaparin. Transition to DOAC/NOAC at discharge.  COVID pneumonia- Chest CT showed diffuse interstitial infiltrates. Last fever T 102.2 on 11/25. Inflammatory markers improving (CRP down from 27.6 to 14.1). He has been treated with Remdesivir 11/25-11/29 and Decadron 11/26-12/5 (can complete oral on discharge). He is on high dose anticoagulation for d-dimer 1.53 and atrial fibrillation.   Diarrhea- resolved.   CKD 2- creatinine 1, and at baseline  Essential hypertension - stable on Lopressor-continue. Vasotec held  DM, type 2, controlled (A1C 6.2) stable on SSI. Metformin held. Continue current insulin regimen  Obesity - BMI 32.24  Fluid/Electroytes/Nutrition Fluids: SLIV,  monitor lytes and replete prn, Diet: Regular Diet Percent Meals Eaten (%): 100 %  Prophylaxis Prophylaxis:  DVT: Systemic anticoagulation GI: Not indicated   CTA-negative.  CXR-bilateral opacities.  Echo-EF 55-65.    Pt is doing better. His breathing is better but he is really tired. He is very confused on medication and has not been taking  any until he gets clarification. He denies any CP, palpitations, headaches. He does not use O2 all the time but does have it at home.     Review of Systems See HPI.     Objective:   Physical Exam Vitals reviewed.  Constitutional:      Appearance: Normal appearance. He is obese.  HENT:     Head: Normocephalic.  Cardiovascular:     Rate and Rhythm: Regular rhythm. Tachycardia present.     Pulses: Normal pulses.  Pulmonary:     Breath sounds: Wheezing and rhonchi present.     Comments: Some signs of labored breathing. No acute distress. Neurological:     General: No focal deficit present.     Mental Status: He is alert and oriented to person, place, and time.  Psychiatric:        Mood and Affect: Mood normal.     Comments: Able to answer questions appropriately.            Assessment & Plan:  Raymond KitchenMarland KitchenJasser was seen today for hospitalization follow-up.  Diagnoses and all orders for this visit:  Pneumonia of both lungs due to infectious organism, unspecified part of lung  Essential hypertension, benign -     valsartan-hydrochlorothiazide (DIOVAN-HCT) 80-12.5 MG tablet; Take 1 tablet by mouth daily.  Stage 3 chronic kidney disease, unspecified whether stage 3a or 3b CKD  Pre-diabetes -     metFORMIN (GLUCOPHAGE) 500 MG tablet; Take 1 tablet (500 mg total) by mouth 2 (two) times daily with a meal.  Anxiety state -     busPIRone (BUSPAR) 10  MG tablet; Take 1 tablet (10 mg total) by mouth 3 (three) times daily. -     vortioxetine HBr (TRINTELLIX) 10 MG TABS tablet; TAKE 1 TABLET(10 MG) BY MOUTH DAILY  Mixed hyperlipidemia -     atorvastatin (LIPITOR) 20 MG tablet; Take 1 tablet (20 mg total) by mouth daily.  Recurrent major depressive disorder, in partial remission (HCC) -     vortioxetine HBr (TRINTELLIX) 10 MG TABS tablet; TAKE 1 TABLET(10 MG) BY MOUTH DAILY  COVID-19 virus infection -     CXR 2 view  Low blood potassium -     BASIC METABOLIC PANEL WITH GFR  PAF  (paroxysmal atrial fibrillation) (HCC)  HR normal today. Vitals stable.   Recheck CXR- stable can take time for completely clear.  Recheck labs. Especially potassium.   Went over all medications.  Needs to start decadron and xaralto that he was sent home with to continue fighting covid and to prevent blood clots. Discussed signs and symptoms of blood clots. Ok to restart metformin.  Continue on metoprolol to help keep heart in rhythm.   Continue on Diovan/HCT for BP control.  Discussed breathing hourly and using incentive spirometry.  Keep on trintillix and buspar for mood.  Use O2 as much as needed at this point. Call office if O2 drops below 90.   Pt request more xanax discussed need to use more sparingly as brain heals.   Pt was evicted while in hospital. He is staying at local hotel. Cannot do home health there. Discussed resources for food.   Follow up in 2 weeks or sooner if needed.

## 2019-05-14 LAB — BASIC METABOLIC PANEL WITH GFR
BUN: 15 mg/dL (ref 7–25)
CO2: 29 mmol/L (ref 20–32)
Calcium: 8.5 mg/dL — ABNORMAL LOW (ref 8.6–10.3)
Chloride: 106 mmol/L (ref 98–110)
Creat: 1.17 mg/dL (ref 0.70–1.18)
GFR, Est African American: 70 mL/min/{1.73_m2} (ref >=60)
GFR, Est Non African American: 60 mL/min/{1.73_m2} (ref >=60)
Glucose, Bld: 101 mg/dL — ABNORMAL HIGH (ref 65–99)
Potassium: 4.2 mmol/L (ref 3.5–5.3)
Sodium: 141 mmol/L (ref 135–146)

## 2019-05-14 NOTE — Progress Notes (Signed)
You continue to have opacities on lungs. From covid this can take some time to clear. Start decadron given by ED and see if you are improving. If you spike a fever or feel like worsening please call office so we can add antibiotic for bacterial coverage as well.

## 2019-05-14 NOTE — Progress Notes (Signed)
Call pt: kidney function has improved since 1 month ago. Calcium is just a tad low. Potassium looks good.

## 2019-05-16 ENCOUNTER — Encounter: Payer: Self-pay | Admitting: Physician Assistant

## 2019-05-16 DIAGNOSIS — G9340 Encephalopathy, unspecified: Secondary | ICD-10-CM | POA: Insufficient documentation

## 2019-05-16 DIAGNOSIS — J449 Chronic obstructive pulmonary disease, unspecified: Secondary | ICD-10-CM | POA: Insufficient documentation

## 2019-05-16 DIAGNOSIS — J189 Pneumonia, unspecified organism: Secondary | ICD-10-CM | POA: Insufficient documentation

## 2019-05-28 ENCOUNTER — Other Ambulatory Visit: Payer: Self-pay

## 2019-05-28 ENCOUNTER — Other Ambulatory Visit: Payer: Self-pay | Admitting: Physician Assistant

## 2019-05-28 ENCOUNTER — Encounter: Payer: Self-pay | Admitting: Physician Assistant

## 2019-05-28 ENCOUNTER — Ambulatory Visit (INDEPENDENT_AMBULATORY_CARE_PROVIDER_SITE_OTHER): Payer: Medicare HMO | Admitting: Physician Assistant

## 2019-05-28 VITALS — BP 104/52 | HR 84 | Temp 97.6°F | Ht 70.5 in | Wt 222.0 lb

## 2019-05-28 DIAGNOSIS — I10 Essential (primary) hypertension: Secondary | ICD-10-CM

## 2019-05-28 DIAGNOSIS — Z8616 Personal history of COVID-19: Secondary | ICD-10-CM

## 2019-05-28 DIAGNOSIS — R7303 Prediabetes: Secondary | ICD-10-CM | POA: Diagnosis not present

## 2019-05-28 DIAGNOSIS — I959 Hypotension, unspecified: Secondary | ICD-10-CM | POA: Diagnosis not present

## 2019-05-28 DIAGNOSIS — I480 Paroxysmal atrial fibrillation: Secondary | ICD-10-CM

## 2019-05-28 LAB — POCT GLYCOSYLATED HEMOGLOBIN (HGB A1C): Hemoglobin A1C: 6.3 % — AB (ref 4.0–5.6)

## 2019-05-28 MED ORDER — VALSARTAN 40 MG PO TABS: 40.0000 mg | ORAL_TABLET | Freq: Every day | ORAL | 0 refills | Status: DC

## 2019-05-28 NOTE — Progress Notes (Addendum)
Acute Office Visit  Subjective:    Patient ID: Raymond Hardin, male    DOB: Jul 13, 1940, 79 y.o.   MRN: [id number]  Chief Complaint  Patient presents with  . Follow-up    HPI Patient is a 79 yo male with recent history of covid, PAF who comes in for follow up.   At last visit we did restart previous BP medication. He comes in light headed today. He admits he has not had anything to eat and only drank one cup of coffee. He was shaky holding a cup. Lightheadedness began yesterday. He is not taking any new medications. Denies any syncope. He slept well last night. Yesterday he would feel better when he would eat or drink. Pt denies any significant SOB but has chronic cough. Denies any chest tightness or pain.   Past Medical History:  Diagnosis Date  . Depression   . Hyperlipidemia   . Hypertension   . Insomnia 2005  . Osteoarthritis 2000    Past Surgical History:  Procedure Laterality Date  . left shoulder replacement  2011  . REPLACEMENT TOTAL KNEE Bilateral     Family History  Problem Relation Age of Onset  . Cancer Mother   . Suicidality Father 79  . Depression Father   . COPD Sister     Social History   Socioeconomic History  . Marital status: Legally Separated    Spouse name: Not on file  . Number of children: 1  . Years of education: 75  . Highest education level: 12th grade  Occupational History  . Occupation: retired    Comment: truck Geophysicist/field seismologist  Tobacco Use  . Smoking status: Current Every Day Smoker    Packs/day: 0.50    Years: 25.00    Pack years: 12.50    Types: Cigarettes    Last attempt to quit: 08/10/2015    Years since quitting: 3.8  . Smokeless tobacco: Never Used  Substance and Sexual Activity  . Alcohol use: Yes    Alcohol/week: 0.0 standard drinks    Comment: occasional  . Drug use: No  . Sexual activity: Yes    Partners: Female  Other Topics Concern  . Not on file  Social History Narrative   Seperated.   Does charity rides for needy    Social Determinants of Health   Financial Resource Strain:   . Difficulty of Paying Living Expenses: Not on file  Food Insecurity:   . Worried About Charity fundraiser in the Last Year: Not on file  . Ran Out of Food in the Last Year: Not on file  Transportation Needs:   . Lack of Transportation (Medical): Not on file  . Lack of Transportation (Non-Medical): Not on file  Physical Activity: Insufficiently Active  . Days of Exercise per Week: 3 days  . Minutes of Exercise per Session: 30 min  Stress:   . Feeling of Stress : Not on file  Social Connections: Unknown  . Frequency of Communication with Friends and Family: More than three times a week  . Frequency of Social Gatherings with Friends and Family: Once a week  . Attends Religious Services: Not on file  . Active Member of Clubs or Organizations: Not on file  . Attends Archivist Meetings: Not on file  . Marital Status: Not on file  Intimate Partner Violence:   . Fear of Current or Ex-Partner: Not on file  . Emotionally Abused: Not on file  . Physically Abused: Not on  file  . Sexually Abused: Not on file    Outpatient Medications Prior to Visit  Medication Sig Dispense Refill  . ALPRAZolam (XANAX) 0.5 MG tablet Take 1 tablet (0.5 mg total) by mouth at bedtime as needed for anxiety. 30 tablet 1  . atorvastatin (LIPITOR) 20 MG tablet Take 1 tablet (20 mg total) by mouth daily. 90 tablet 1  . busPIRone (BUSPAR) 10 MG tablet Take 1 tablet (10 mg total) by mouth 3 (three) times daily. 90 tablet 1  . cholecalciferol (VITAMIN D) 1000 units tablet Take 1,000 Units by mouth daily.    . diclofenac sodium (VOLTAREN) 1 % GEL Apply 4 g topically 4 (four) times daily. To affected joint. 100 g 1  . metFORMIN (GLUCOPHAGE) 500 MG tablet Take 1 tablet (500 mg total) by mouth 2 (two) times daily with a meal. 180 tablet 0  . metoprolol succinate (TOPROL-XL) 100 MG 24 hr tablet Take 100 mg by mouth daily.    . sildenafil (REVATIO)  20 MG tablet TAKE 5 TABLETS BY MOUTH AS NEEDED FOR ERECTILE DYSFUNCTION( PRIOR TO SEXUAL ACTIVITY) 30 tablet 5  . vortioxetine HBr (TRINTELLIX) 10 MG TABS tablet TAKE 1 TABLET(10 MG) BY MOUTH DAILY 30 tablet 2  . XARELTO 20 MG TABS tablet Take 20 mg by mouth daily.    . valsartan-hydrochlorothiazide (DIOVAN-HCT) 80-12.5 MG tablet Take 1 tablet by mouth daily. 90 tablet 0   No facility-administered medications prior to visit.    No Known Allergies  Review of Systems See HPI.     Objective:    Physical Exam Vitals reviewed.  Constitutional:      Appearance: Normal appearance. He is obese.  HENT:     Head: Normocephalic.  Cardiovascular:     Rate and Rhythm: Normal rate and regular rhythm.  Pulmonary:     Effort: Pulmonary effort is normal.     Breath sounds: Normal breath sounds. No wheezing or rhonchi.  Neurological:     General: No focal deficit present.     Mental Status: He is alert and oriented to person, place, and time.     Comments: Tremor right hand when came in holding cup.   Psychiatric:        Mood and Affect: Mood normal.     BP (!) 104/52   Pulse 84   Temp 97.6 F (36.4 C) (Oral)   Ht 5' 10.5" (1.791 m)   Wt 222 lb (100.7 kg)   SpO2 94%   BMI 31.40 kg/m  Wt Readings from Last 3 Encounters:  05/28/19 222 lb (100.7 kg)  05/13/19 227 lb (103 kg)  04/01/19 228 lb (103.4 kg)       Assessment & Plan:  Marland KitchenMarland KitchenCypher was seen today for follow-up.  Diagnoses and all orders for this visit:  Hypotension, unspecified hypotension type  PAF (paroxysmal atrial fibrillation) ([place])  History of COVID-19  Pre-diabetes -     POCT glycosylated hemoglobin (Hb A1C)  Hypertension, essential, benign -     valsartan (DIOVAN) 40 MG tablet; Take 1 tablet (40 mg total) by mouth daily.   .. Lab Results  Component Value Date   HGBA1C 6.3 (A) 05/28/2019    Pt is hypotension likely due to over-medication and not drinking enough water. After drinking 1 cup of water/1  soda and pack of crackers his BP went up and he felt much better. A1C today controlled. Stop diovan/HCTZ. Decreased dose to diovan 40mg . Continue metoprolol for rate control. Continue metformin for sugars. Expressed  importance of drinking at least 4 bottles of water a day. Recheck BP in 1 week. HR is great. Pulse Ox is improving. Declines any inhalers for COPD.    Marland KitchenVernetta Honey PA-C, have reviewed and agree with the above documentation in it's entirety.

## 2019-05-29 ENCOUNTER — Encounter: Payer: Self-pay | Admitting: Physician Assistant

## 2019-05-29 DIAGNOSIS — I959 Hypotension, unspecified: Secondary | ICD-10-CM | POA: Insufficient documentation

## 2019-06-11 ENCOUNTER — Encounter: Payer: Self-pay | Admitting: Physician Assistant

## 2019-06-11 ENCOUNTER — Other Ambulatory Visit: Payer: Self-pay

## 2019-06-11 ENCOUNTER — Ambulatory Visit (INDEPENDENT_AMBULATORY_CARE_PROVIDER_SITE_OTHER): Payer: Medicare HMO | Admitting: Physician Assistant

## 2019-06-11 VITALS — BP 139/68 | HR 80 | Ht 70.5 in | Wt 225.0 lb

## 2019-06-11 DIAGNOSIS — Z8616 Personal history of COVID-19: Secondary | ICD-10-CM | POA: Insufficient documentation

## 2019-06-11 DIAGNOSIS — J411 Mucopurulent chronic bronchitis: Secondary | ICD-10-CM | POA: Diagnosis not present

## 2019-06-11 DIAGNOSIS — I480 Paroxysmal atrial fibrillation: Secondary | ICD-10-CM | POA: Diagnosis not present

## 2019-06-11 DIAGNOSIS — I10 Essential (primary) hypertension: Secondary | ICD-10-CM

## 2019-06-11 MED ORDER — FLUTICASONE FUROATE-VILANTEROL 200-25 MCG/INH IN AEPB: 1.0000 | INHALATION_SPRAY | Freq: Every day | RESPIRATORY_TRACT | 5 refills | Status: DC

## 2019-06-11 NOTE — Progress Notes (Signed)
Subjective:    Patient ID: Raymond Hardin, male    DOB: 05-10-1940, 79 y.o.   MRN: Raymond Hardin  HPI  Pt is a 79 yo male with COPD, HTN, PAF, pre-diabetes who presents to the clinic for follow up. Pt has a recent hx of covid-19 pneumonia back in November and he is still recovering. He is feeling a lot better. He is breathing and moving better. He only uses O2 at night when sleeping. He has never used daily inhaler for lungs. He did stop smoking but he is "dipping" now. No fever, chills, body aches. Energy is better. He is eating and drinking without problems. He is compliant with medications.   .. Active Ambulatory Problems    Diagnosis Date Noted  . Erectile dysfunction 11/04/2011  . Tobacco abuse 12/12/2012  . Depression 12/12/2012  . Primary osteoarthritis of right knee 04/24/2013  . Vitamin D deficiency 08/13/2013  . Pre-diabetes 08/16/2013  . Status post left knee replacement 08/13/2014  . Anxiety state 08/13/2014  . Essential hypertension, benign 08/13/2014  . Hyperlipidemia 02/24/2015  . Hypertriglyceridemia 02/24/2015  . CKD (chronic kidney disease), stage III 12/07/2015  . S/P total knee replacement, right 01/31/2017  . Bilateral cataracts 01/05/2018  . Posterior vitreous detachment of left eye 04/09/2018  . 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 S/P bilateral cataract extraction 05/25/2018  . Recurrent major depressive disorder, in partial remission (Raymond Hardin) 06/19/2018  . Skipped heart beats 09/17/2018  . Strong pulse 09/17/2018  . Palpitation 09/19/2018  . Acute bilateral low back pain without sciatica 01/28/2019  . PAF (paroxysmal atrial fibrillation) (Raymond Hardin) 05/13/2019  . Low blood potassium 05/13/2019  . Acute encephalopathy 05/16/2019  . Pneumonia of both lungs due to infectious organism 05/16/2019  . COPD (chronic obstructive pulmonary disease) with chronic bronchitis (Raymond Hardin) 05/16/2019  . Hypotension 05/29/2019  . History of COVID-19 06/11/2019  . Mucopurulent chronic  bronchitis (Raymond Hardin) 06/11/2019   Resolved Ambulatory Problems    Diagnosis Date Noted  . Hypertension, essential, benign 11/04/2011   Past Medical History:  Diagnosis Date  . Hypertension   . Insomnia 2005  . Osteoarthritis 2000      Review of Systems  All other systems reviewed and are negative.      Objective:   Physical Exam Vitals reviewed.  Constitutional:      Appearance: Normal appearance. He is obese.  HENT:     Head: Normocephalic.  Cardiovascular:     Rate and Rhythm: Normal rate and regular rhythm.  Pulmonary:     Effort: Pulmonary effort is normal.     Comments: Course breath sounds throughout both lungs.  Musculoskeletal:     Right lower leg: No edema.     Left lower leg: No edema.  Neurological:     General: No focal deficit present.     Mental Status: He is alert.           Assessment & Plan:  Marland KitchenMarland KitchenHoan was seen today for follow-up.  Diagnoses and all orders for this visit:  Mucopurulent chronic bronchitis (Raymond Hardin) -     fluticasone furoate-vilanterol (BREO ELLIPTA) 200-25 MCG/INH AEPB; Inhale 1 puff into the lungs daily.  History of COVID-19  Essential hypertension, benign  PAF (paroxysmal atrial fibrillation) (Raymond Hardin)   Pt seems to be improving. BP much better. HR regular.  Stay on diovan and metoprolol.  Seems to be breathing better. Pulse ox improving at 97 percent today. Only on O2 at night. Not been treated for underlying COPD. Added BREO daily.  Discussed how to use. Follow up in 3 months. Keep moving and taking good deep breaths every hour.

## 2019-06-12 ENCOUNTER — Encounter: Payer: Self-pay | Admitting: Physician Assistant

## 2019-06-13 DIAGNOSIS — U071 COVID-19: Secondary | ICD-10-CM | POA: Diagnosis not present

## 2019-06-15 ENCOUNTER — Other Ambulatory Visit: Payer: Self-pay | Admitting: Physician Assistant

## 2019-06-15 DIAGNOSIS — F411 Generalized anxiety disorder: Secondary | ICD-10-CM

## 2019-06-15 DIAGNOSIS — N529 Male erectile dysfunction, unspecified: Secondary | ICD-10-CM

## 2019-06-17 DIAGNOSIS — Z20822 Contact with and (suspected) exposure to covid-19: Secondary | ICD-10-CM | POA: Diagnosis not present

## 2019-06-17 DIAGNOSIS — J189 Pneumonia, unspecified organism: Secondary | ICD-10-CM | POA: Diagnosis not present

## 2019-06-26 ENCOUNTER — Other Ambulatory Visit: Payer: Self-pay

## 2019-06-26 ENCOUNTER — Encounter: Payer: Self-pay | Admitting: Emergency Medicine

## 2019-06-26 ENCOUNTER — Emergency Department (INDEPENDENT_AMBULATORY_CARE_PROVIDER_SITE_OTHER)
Admission: EM | Admit: 2019-06-26 | Discharge: 2019-06-26 | Disposition: A | Discharge status: Home / Self Care | Payer: Medicare HMO | Source: Home / Self Care | Attending: Family Medicine | Admitting: Family Medicine

## 2019-06-26 ENCOUNTER — Telehealth: Payer: Self-pay | Admitting: Neurology

## 2019-06-26 DIAGNOSIS — R002 Palpitations: Secondary | ICD-10-CM

## 2019-06-26 HISTORY — DX: Type 2 diabetes mellitus without complications: E11.9

## 2019-06-26 HISTORY — DX: Paroxysmal atrial fibrillation: I48.0

## 2019-06-26 HISTORY — DX: Pneumonia, unspecified organism: J18.9

## 2019-06-26 HISTORY — DX: Chronic obstructive pulmonary disease, unspecified: J44.9

## 2019-06-26 NOTE — Discharge Instructions (Addendum)
Continue all medications as prescribed. Be sure to use oxygen at night as prescribed.   Check oxygen level and pulse daily; if heart rate is high, retest with oxygen on. If symptoms become significantly worse during the night or over the weekend, proceed to the local emergency room.

## 2019-06-26 NOTE — Telephone Encounter (Signed)
Patient made aware and will go to urgent care today.

## 2019-06-26 NOTE — Telephone Encounter (Signed)
Patient left vm that his heart rate is running 98-110, O2 98%, and having some pains in his chest. He is worried about increased heart rate. Please advise next steps.

## 2019-06-26 NOTE — Telephone Encounter (Signed)
Tried to call him back, no answer, vm full.

## 2019-06-26 NOTE — ED Triage Notes (Signed)
Patient was told to come for evaluation by [facility]; he had reported unusual heart rate with occasional sense of pressure across upper chest over past week. Currently in no distress; has nasal oxygen at 4L/min but asked that it be turned down to 2L/ minute. No current pain or shortness of breath. Has had covid and pneumonia recently.

## 2019-06-26 NOTE — Telephone Encounter (Signed)
We don't have any openings today. With history of a.fib you could be in a.fib need to come to UC for EKG. Ok to come next door.

## 2019-06-26 NOTE — ED Provider Notes (Signed)
[facility]    CSN: [id number] Arrival date & time: 06/26/19  1712      History   Chief Complaint Chief Complaint  Patient presents with  . Tachycardia    HPI Raymond Hardin is a 79 y.o. male.   Patient reports that this morning he felt that his hear rate was fast.  He used his pulse oximeter and discovered that his pulse was about 104, and his SpO2 was 96%.  He has COPD but admits that he was not using his O2 at that time.  He reports an occasional vague ache in his anterior chest, but no shortness of breath or cough. He states that he takes most of his once daily medications in the morning. He recently recovered from [place] pneumonia.  The history is provided by the patient.    Past Medical History:  Diagnosis Date  . COPD (chronic obstructive pulmonary disease) ([place])   . COVID-19 03/2019  . Depression   . Diabetes mellitus without complication ([place])   . Hyperlipidemia   . Hypertension   . Insomnia 2005  . Osteoarthritis 2000  . Paroxysmal A-fib ([place])   . Pneumonia     Patient Active Problem List   Diagnosis Date Noted  . History of COVID-19 06/11/2019  . Mucopurulent chronic bronchitis ([place]) 06/11/2019  . Hypotension 05/29/2019  . Acute encephalopathy 05/16/2019  . Pneumonia of both lungs due to infectious organism 05/16/2019  . COPD (chronic obstructive pulmonary disease) with chronic bronchitis ([place]) 05/16/2019  . PAF (paroxysmal atrial fibrillation) ([place]) 05/13/2019  . Low blood potassium 05/13/2019  . Acute bilateral low back pain without sciatica 01/28/2019  . Palpitation 09/19/2018  . Skipped heart beats 09/17/2018  . Strong pulse 09/17/2018  . Recurrent major depressive disorder, in partial remission ([place]) 06/19/2018  . S/P bilateral cataract extraction 05/25/2018  . Posterior vitreous detachment of left eye 04/09/2018  . 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 Bilateral cataracts 01/05/2018  . S/P total knee replacement, right 01/31/2017  .  CKD (chronic kidney disease), stage III 12/07/2015  . Hyperlipidemia 02/24/2015  . Hypertriglyceridemia 02/24/2015  . Status post left knee replacement 08/13/2014  . Anxiety state 08/13/2014  . Essential hypertension, benign 08/13/2014  . Pre-diabetes 08/16/2013  . Vitamin D deficiency 08/13/2013  . Primary osteoarthritis of right knee 04/24/2013  . Tobacco abuse 12/12/2012  . Depression 12/12/2012  . Erectile dysfunction 11/04/2011    Past Surgical History:  Procedure Laterality Date  . EYE SURGERY    . JOINT REPLACEMENT    . left shoulder replacement  2011  . REPLACEMENT TOTAL KNEE Bilateral        Home Medications    Prior to Admission medications   Medication Sig Start Date End Date Taking? Authorizing Provider  ALPRAZolam (XANAX) 0.5 MG tablet TAKE 1 TABLET(0.5 MG) BY MOUTH AT BEDTIME AS NEEDED FOR ANXIETY 06/17/19   Breeback, Jade L, PA-C  sildenafil (REVATIO) 20 MG tablet TAKE 5 TABLETS BY MOUTH AS NEEDED FOR ERECTILE DYSFUNCTION 06/17/19   Breeback, Jade L, PA-C  atorvastatin (LIPITOR) 20 MG tablet Take 1 tablet (20 mg total) by mouth daily. 05/13/19   Breeback, Jade L, PA-C  busPIRone (BUSPAR) 10 MG tablet Take 1 tablet (10 mg total) by mouth 3 (three) times daily. 05/13/19   Breeback, Royetta Car, PA-C  cholecalciferol (VITAMIN D) 1000 units tablet Take 1,000 Units by mouth daily.    [provider]  diclofenac sodium (VOLTAREN) 1 % GEL Apply 4 g topically 4 (  four) times daily. To affected joint. 01/25/19   Breeback, Jade L, PA-C  fluticasone furoate-vilanterol (BREO ELLIPTA) 200-25 MCG/INH AEPB Inhale 1 puff into the lungs daily. 06/11/19   Donella Stade, PA-C  metFORMIN (GLUCOPHAGE) 500 MG tablet Take 1 tablet (500 mg total) by mouth 2 (two) times daily with a meal. 05/13/19   Breeback, Jade L, PA-C  metoprolol succinate (TOPROL-XL) 100 MG 24 hr tablet Take 100 mg by mouth daily. 04/17/19   [provider]  valsartan (DIOVAN) 40 MG tablet TAKE 1 TABLET(40 MG) BY  MOUTH DAILY 05/29/19   Breeback, Jade L, PA-C  vortioxetine HBr (TRINTELLIX) 10 MG TABS tablet TAKE 1 TABLET(10 MG) BY MOUTH DAILY 05/13/19   Breeback, Jade L, PA-C  XARELTO 20 MG TABS tablet Take 20 mg by mouth daily. 04/17/19   [provider]    Family History Family History  Problem Relation Age of Onset  . Cancer Mother   . Suicidality Father 2  . Depression Father   . COPD Sister     Social History Social History   Tobacco Use  . Smoking status: Former Smoker    Packs/day: 0.50    Years: 25.00    Pack years: 12.50    Types: Cigarettes    Quit date: 05/10/2019    Years since quitting: 0.1  . Smokeless tobacco: Current User    Types: Chew  Substance Use Topics  . Alcohol use: Yes    Alcohol/week: 0.0 standard drinks    Comment: occasional  . Drug use: No     Allergies   Patient has no known allergies.   Review of Systems Review of Systems  Constitutional: Negative.   HENT: Negative.   Eyes: Negative.   Respiratory: Positive for chest tightness. Negative for cough, shortness of breath, wheezing and stridor.   Cardiovascular: Positive for palpitations. Negative for chest pain and leg swelling.  Gastrointestinal: Negative.   Genitourinary: Negative.   Musculoskeletal: Negative.   Skin: Negative.   Neurological: Negative.      Physical Exam Triage Vital Signs ED Triage Vitals  Enc Vitals Group     BP      Pulse      Resp      Temp      Temp src      SpO2      Weight      Height      Head Circumference      Peak Flow      Pain Score      Pain Loc      Pain Edu?      Excl. in [place]?    No data found.  Updated Vital Signs BP (!) 149/95 (BP Location: Left Arm)   Pulse (!) 105   Temp 97.7 F (36.5 C) (Oral)   Resp 18   SpO2 98% Comment: 2 L /min  Visual Acuity Right Eye Distance:   Left Eye Distance:   Bilateral Distance:    Right Eye Near:   Left Eye Near:    Bilateral Near:     Physical Exam Nursing notes and Vital Signs  reviewed. Appearance:  Patient appears stated age, and in no acute distress.    Eyes:  Pupils are equal, round, and reactive to light and accomodation.  Extraocular movement is intact.  Conjunctivae are not inflamed   Pharynx:  Normal; moist mucous membranes  Neck:  Supple.  No adenopathy Lungs:  Clear to auscultation.  Breath sounds are equal.  Moving air well. Heart:  Regular rate and rhythm without murmurs, rubs, or gallops.  Abdomen:  Nontender without masses or hepatosplenomegaly.  Bowel sounds are present.  No CVA or flank tenderness.  Extremities:  No edema.  Skin:  No rash present.     UC Treatments / Results  Labs (all labs ordered are listed, but only abnormal results are displayed) Labs Reviewed - No data to display  EKG  Rate:  101 BPM PR:  154 msec QT:  364 msec QTcH:  471 msec QRSD:  80 msec QRS axis:  17 degrees Interpretation:  Sinus tachycardia; otherwise within normal limits; no acute changes. Review of records reveals minimal change from EKG done 17 Sep 2018 (except for rate)   Radiology No results found.  Procedures Procedures (including critical care time)  Medications Ordered in UC Medications - No data to display  Initial Impression / Assessment and Plan / UC Course  I have reviewed the triage vital signs and the nursing notes.  Pertinent labs & imaging results that were available during my care of the patient were reviewed by me and considered in my medical decision making (see chart for details).    EKG:  normal sinus rhythm.  Mild tachycardia on EKG (rate 101) but no acute changes. Followup with Family Doctor if not improved in one week.   Final Clinical Impressions(s) / UC Diagnoses   Final diagnoses:  Palpitations     Discharge Instructions     Continue all medications as prescribed. Be sure to use oxygen at night as prescribed.   Check oxygen level and pulse daily; if heart rate is high, retest with oxygen on. If symptoms become  significantly worse during the night or over the weekend, proceed to the local emergency room.     ED Prescriptions    None        Kandra Nicolas, MD 06/26/19 2017

## 2019-07-11 DIAGNOSIS — U071 COVID-19: Secondary | ICD-10-CM | POA: Diagnosis not present

## 2019-08-11 DIAGNOSIS — U071 COVID-19: Secondary | ICD-10-CM | POA: Diagnosis not present

## 2019-08-13 ENCOUNTER — Other Ambulatory Visit: Payer: Self-pay | Admitting: Neurology

## 2019-08-13 DIAGNOSIS — R7303 Prediabetes: Secondary | ICD-10-CM

## 2019-08-13 MED ORDER — VALSARTAN 40 MG PO TABS: ORAL_TABLET | ORAL | 0 refills | Status: DC

## 2019-08-13 MED ORDER — METFORMIN HCL 500 MG PO TABS: 500.0000 mg | ORAL_TABLET | Freq: Two times a day (BID) | ORAL | 0 refills | Status: DC

## 2019-08-26 DIAGNOSIS — H04121 Dry eye syndrome of right lacrimal gland: Secondary | ICD-10-CM | POA: Diagnosis not present

## 2019-08-26 DIAGNOSIS — H02834 Dermatochalasis of left upper eyelid: Secondary | ICD-10-CM | POA: Diagnosis not present

## 2019-08-26 DIAGNOSIS — Z0100 Encounter for examination of eyes and vision without abnormal findings: Secondary | ICD-10-CM | POA: Diagnosis not present

## 2019-08-26 DIAGNOSIS — I10 Essential (primary) hypertension: Secondary | ICD-10-CM | POA: Diagnosis not present

## 2019-08-26 DIAGNOSIS — H02831 Dermatochalasis of right upper eyelid: Secondary | ICD-10-CM | POA: Diagnosis not present

## 2019-09-02 ENCOUNTER — Other Ambulatory Visit: Payer: Self-pay | Admitting: Neurology

## 2019-09-02 DIAGNOSIS — F411 Generalized anxiety disorder: Secondary | ICD-10-CM

## 2019-09-02 MED ORDER — BUSPIRONE HCL 10 MG PO TABS: 10.0000 mg | ORAL_TABLET | Freq: Three times a day (TID) | ORAL | 1 refills | Status: DC

## 2019-09-09 ENCOUNTER — Ambulatory Visit: Payer: Medicare HMO | Admitting: Physician Assistant

## 2019-09-10 DIAGNOSIS — U071 COVID-19: Secondary | ICD-10-CM | POA: Diagnosis not present

## 2019-09-11 ENCOUNTER — Ambulatory Visit: Payer: Medicare HMO | Admitting: Physician Assistant

## 2019-10-01 ENCOUNTER — Ambulatory Visit (INDEPENDENT_AMBULATORY_CARE_PROVIDER_SITE_OTHER): Payer: Medicare HMO | Admitting: Physician Assistant

## 2019-10-01 ENCOUNTER — Encounter: Payer: Self-pay | Admitting: Physician Assistant

## 2019-10-01 VITALS — BP 136/93 | HR 103 | Ht 70.5 in | Wt 220.0 lb

## 2019-10-01 DIAGNOSIS — R7303 Prediabetes: Secondary | ICD-10-CM | POA: Diagnosis not present

## 2019-10-01 DIAGNOSIS — J411 Mucopurulent chronic bronchitis: Secondary | ICD-10-CM

## 2019-10-01 DIAGNOSIS — F411 Generalized anxiety disorder: Secondary | ICD-10-CM

## 2019-10-01 DIAGNOSIS — I10 Essential (primary) hypertension: Secondary | ICD-10-CM | POA: Diagnosis not present

## 2019-10-01 DIAGNOSIS — I480 Paroxysmal atrial fibrillation: Secondary | ICD-10-CM | POA: Diagnosis not present

## 2019-10-01 DIAGNOSIS — N1831 Chronic kidney disease, stage 3a: Secondary | ICD-10-CM

## 2019-10-01 DIAGNOSIS — R05 Cough: Secondary | ICD-10-CM

## 2019-10-01 DIAGNOSIS — R0602 Shortness of breath: Secondary | ICD-10-CM

## 2019-10-01 DIAGNOSIS — R059 Cough, unspecified: Secondary | ICD-10-CM

## 2019-10-01 DIAGNOSIS — E782 Mixed hyperlipidemia: Secondary | ICD-10-CM

## 2019-10-01 DIAGNOSIS — F3341 Major depressive disorder, recurrent, in partial remission: Secondary | ICD-10-CM | POA: Diagnosis not present

## 2019-10-01 MED ORDER — XARELTO 20 MG PO TABS: 20.0000 mg | ORAL_TABLET | Freq: Every day | ORAL | 3 refills | Status: DC

## 2019-10-01 MED ORDER — BUSPIRONE HCL 10 MG PO TABS: 10.0000 mg | ORAL_TABLET | Freq: Three times a day (TID) | ORAL | 1 refills | Status: DC

## 2019-10-01 MED ORDER — ALBUTEROL SULFATE HFA 108 (90 BASE) MCG/ACT IN AERS
2.0000 | INHALATION_SPRAY | Freq: Four times a day (QID) | RESPIRATORY_TRACT | 1 refills | Status: DC | PRN
Start: 2019-10-01 — End: 2019-10-02

## 2019-10-01 MED ORDER — VALSARTAN 40 MG PO TABS: ORAL_TABLET | ORAL | 1 refills | Status: DC

## 2019-10-01 MED ORDER — METOPROLOL SUCCINATE ER 100 MG PO TB24: 100.0000 mg | ORAL_TABLET | Freq: Every day | ORAL | 1 refills | Status: DC

## 2019-10-01 MED ORDER — VORTIOXETINE HBR 10 MG PO TABS: ORAL_TABLET | ORAL | 1 refills | Status: DC

## 2019-10-01 MED ORDER — METFORMIN HCL 500 MG PO TABS: 500.0000 mg | ORAL_TABLET | Freq: Two times a day (BID) | ORAL | 1 refills | Status: DC

## 2019-10-01 MED ORDER — ATORVASTATIN CALCIUM 20 MG PO TABS: 20.0000 mg | ORAL_TABLET | Freq: Every day | ORAL | 1 refills | Status: DC

## 2019-10-01 MED ORDER — TRELEGY ELLIPTA 100-62.5-25 MCG/INH IN AEPB: 1.0000 | INHALATION_SPRAY | Freq: Every day | RESPIRATORY_TRACT | 5 refills | Status: DC

## 2019-10-01 NOTE — Patient Instructions (Addendum)
Get labs.  Get Chest CT.  Start trelegy 1 puff once a day.

## 2019-10-01 NOTE — Progress Notes (Signed)
Subjective:    Patient ID: Raymond Hardin, male    DOB: January 02, 1941, 79 y.o.   MRN: [id number]  HPI  Pt is a 79 yo with COPD, hx of COVID, HTN, pre-diabetes, MDD, anxiety, PAF who presents to the clinic for follow up.   Afib-controlled. No problems breathing, CP, palpitations, headaches. On xaralto and metoprolol.   Pt reports intermittent SOB, cough , congestion since COVID in November of last year. His most recent pneumonia was feb 2021. He admits he has been out of BREO for 2 months. He continues to smoke 1/2 pack daily. He does not have rescue inhaler. He is using oxygen as needed for a few hours at a time.   Taking metformin. Not checking sugars. No hypoglycemic events.   Mood is ok. He has good and bad days. No SI/HC.   Marland Kitchen. Active Ambulatory Problems    Diagnosis Date Noted  . Erectile dysfunction 11/04/2011  . Tobacco abuse 12/12/2012  . Depression 12/12/2012  . Primary osteoarthritis of right knee 04/24/2013  . Vitamin D deficiency 08/13/2013  . Pre-diabetes 08/16/2013  . Status post left knee replacement 08/13/2014  . Anxiety state 08/13/2014  . Essential hypertension, benign 08/13/2014  . Hyperlipidemia 02/24/2015  . Hypertriglyceridemia 02/24/2015  . CKD (chronic kidney disease), stage III 12/07/2015  . S/P total knee replacement, right 01/31/2017  . Bilateral cataracts 01/05/2018  . Posterior vitreous detachment of left eye 04/09/2018  . 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 S/P bilateral cataract extraction 05/25/2018  . Recurrent major depressive disorder, in partial remission ([place]) 06/19/2018  . Skipped heart beats 09/17/2018  . Strong pulse 09/17/2018  . Palpitation 09/19/2018  . Acute bilateral low back pain without sciatica 01/28/2019  . PAF (paroxysmal atrial fibrillation) ([place]) 05/13/2019  . Low blood potassium 05/13/2019  . Acute encephalopathy 05/16/2019  . Pneumonia of both lungs due to infectious organism 05/16/2019  . COPD (chronic obstructive pulmonary  disease) with chronic bronchitis ([place]) 05/16/2019  . Hypotension 05/29/2019  . History of COVID-19 06/11/2019  . Mucopurulent chronic bronchitis ([place]) 06/11/2019   Resolved Ambulatory Problems    Diagnosis Date Noted  . Hypertension, essential, benign 11/04/2011   Past Medical History:  Diagnosis Date  . COPD (chronic obstructive pulmonary disease) ([place])   . COVID-19 03/2019  . Diabetes mellitus without complication ([place])   . Hypertension   . Insomnia 2005  . Osteoarthritis 2000  . Paroxysmal A-fib ([place])   . Pneumonia      Review of Systems See HPI.     Objective:   Physical Exam Vitals reviewed.  Constitutional:      Appearance: Normal appearance.  Cardiovascular:     Rate and Rhythm: Normal rate and regular rhythm.     Pulses: Normal pulses.  Pulmonary:     Effort: Pulmonary effort is normal.     Comments: Coarse breath sounds.  Neurological:     General: No focal deficit present.     Mental Status: He is alert and oriented to person, place, and time.  Psychiatric:        Mood and Affect: Mood normal.    .. Depression screen [facility] 2/9 10/01/2019 02/04/2019 10/24/2018 09/17/2018 06/18/2018  Decreased Interest 2 0 2 2 0  Down, Depressed, Hopeless 3 0 0 1 0  PHQ - 2 Score 5 0 2 3 0  Altered sleeping 2 - 1 0 0  Tired, decreased energy 2 - 1 1 1   Change in appetite 2 - 2 0 0  Feeling bad or failure about yourself  0 - 1 2 0  Trouble concentrating 2 - 1 0 0  Moving slowly or fidgety/restless 2 - 1 1 1   Suicidal thoughts 2 - 0 0 0  PHQ-9 Score 17 - 9 7 2   Difficult doing work/chores Somewhat difficult - Somewhat difficult Somewhat difficult Not difficult at all  Some recent data might be hidden   .Marland Kitchen GAD 7 : Generalized Anxiety Score 10/01/2019 10/24/2018 09/17/2018 06/18/2018  Nervous, Anxious, on Edge 0 1 1 2   Control/stop worrying 2 2 2  0  Worry too much - different things 1 1 3 3   Trouble relaxing 2 0 0 0  Restless 2 1 0 0  Easily annoyed or irritable 2 1 1  0   Afraid - awful might happen 0 1 0 0  Total GAD 7 Score 9 7 7 5   Anxiety Difficulty Somewhat difficult Somewhat difficult Somewhat difficult -           Assessment & Plan:  Marland KitchenMarland KitchenEmeal was seen today for follow-up.  Diagnoses and all orders for this visit:  Essential hypertension, benign -     valsartan (DIOVAN) 40 MG tablet; TAKE 1 TABLET(40 MG) BY MOUTH DAILY  Anxiety state -     busPIRone (BUSPAR) 10 MG tablet; Take 1 tablet (10 mg total) by mouth 3 (three) times daily. -     vortioxetine HBr (TRINTELLIX) 10 MG TABS tablet; TAKE 1 TABLET(10 MG) BY MOUTH DAILY  Recurrent major depressive disorder, in partial remission (HCC) -     vortioxetine HBr (TRINTELLIX) 10 MG TABS tablet; TAKE 1 TABLET(10 MG) BY MOUTH DAILY  Mixed hyperlipidemia -     atorvastatin (LIPITOR) 20 MG tablet; Take 1 tablet (20 mg total) by mouth daily. -     Lipid Panel w/reflex Direct LDL  Mucopurulent chronic bronchitis (HCC) -     COMPLETE METABOLIC PANEL WITH GFR -     Fluticasone-Umeclidin-Vilant (TRELEGY ELLIPTA) 100-62.5-25 MCG/INH AEPB; Inhale 1 puff into the lungs daily.  Pre-diabetes -     metFORMIN (GLUCOPHAGE) 500 MG tablet; Take 1 tablet (500 mg total) by mouth 2 (two) times daily with a meal. -     COMPLETE METABOLIC PANEL WITH GFR -     Hemoglobin A1c  PAF (paroxysmal atrial fibrillation) (HCC) -     metoprolol succinate (TOPROL-XL) 100 MG 24 hr tablet; Take 1 tablet (100 mg total) by mouth daily. -     XARELTO 20 MG TABS tablet; Take 1 tablet (20 mg total) by mouth daily.  Stage 3a chronic kidney disease  Other orders -     Discontinue: albuterol (VENTOLIN HFA) 108 (90 Base) MCG/ACT inhaler; Inhale 2 puffs into the lungs every 6 (six) hours as needed.  BP not quite to goal. Continue same medications. Discussed low salt diet. Consider daily walking.   Recent SOB. Likely being out of BREO. Stop BREO. Start Trelegy. Albuterol as needed. Oxygen as needed.  Persistent cough and SOB since  covid. Xray showed pneumonia in febuary. Needs CT of chest.  Needs spironmetry. Follow up in 1 month.   HR controlled today. Stay on Xaralto and metoprolol.   Labs ordered.

## 2019-10-02 ENCOUNTER — Telehealth: Payer: Self-pay | Admitting: Physician Assistant

## 2019-10-02 ENCOUNTER — Other Ambulatory Visit: Payer: Self-pay | Admitting: Neurology

## 2019-10-02 ENCOUNTER — Encounter: Payer: Self-pay | Admitting: Physician Assistant

## 2019-10-02 DIAGNOSIS — R0602 Shortness of breath: Secondary | ICD-10-CM | POA: Insufficient documentation

## 2019-10-02 DIAGNOSIS — Z1211 Encounter for screening for malignant neoplasm of colon: Secondary | ICD-10-CM

## 2019-10-02 MED ORDER — ALBUTEROL SULFATE HFA 108 (90 BASE) MCG/ACT IN AERS: 2.0000 | INHALATION_SPRAY | Freq: Four times a day (QID) | RESPIRATORY_TRACT | 1 refills | Status: DC | PRN

## 2019-10-02 NOTE — Progress Notes (Signed)
90 day requested

## 2019-10-02 NOTE — Telephone Encounter (Signed)
Can you help me look for colonoscopy. It is not even showing up as needed. He says he has had it done.

## 2019-10-02 NOTE — Telephone Encounter (Signed)
Can not find Colonoscopy in chart. Tried to call patient to see where he had this done, no answer, vm full.

## 2019-10-04 NOTE — Telephone Encounter (Signed)
Spoke with patient. He states he has never had a colonoscopy done. He is okay with [place] ordering this. Referral sent. Reinhart Saulters - fyi.

## 2019-10-11 DIAGNOSIS — U071 COVID-19: Secondary | ICD-10-CM | POA: Diagnosis not present

## 2019-10-17 ENCOUNTER — Encounter: Payer: Self-pay | Admitting: Gastroenterology

## 2019-10-30 ENCOUNTER — Ambulatory Visit (INDEPENDENT_AMBULATORY_CARE_PROVIDER_SITE_OTHER): Payer: Medicare HMO | Admitting: Physician Assistant

## 2019-10-30 ENCOUNTER — Telehealth: Payer: Self-pay | Admitting: Physician Assistant

## 2019-10-30 VITALS — BP 130/67 | HR 106 | Ht 70.5 in | Wt 227.0 lb

## 2019-10-30 DIAGNOSIS — R0602 Shortness of breath: Secondary | ICD-10-CM

## 2019-10-30 DIAGNOSIS — R05 Cough: Secondary | ICD-10-CM | POA: Diagnosis not present

## 2019-10-30 DIAGNOSIS — R059 Cough, unspecified: Secondary | ICD-10-CM

## 2019-10-30 DIAGNOSIS — J449 Chronic obstructive pulmonary disease, unspecified: Secondary | ICD-10-CM | POA: Diagnosis not present

## 2019-10-30 MED ORDER — ALBUTEROL SULFATE HFA 108 (90 BASE) MCG/ACT IN AERS
2.0000 | INHALATION_SPRAY | Freq: Once | RESPIRATORY_TRACT | Status: AC
  Administered 2019-10-30: 2 via RESPIRATORY_TRACT

## 2019-10-30 NOTE — Progress Notes (Addendum)
Subjective:    Patient ID: Raymond Hardin, male    DOB: Jul 17, 1940, 79 y.o.   MRN: [id number]  HPI  Raymond Hardin is a 79 year old male with PMH of hx of COVID infection, COPD, HTN, CKD, PAF, MDD, anxiety presenting for spirometry testing and medication follow up.   Pt has been experiencing SOB, cough, congestion since COVID in November 2020. Patient prescribed Trelegy inhaler, Albuterol PRN, and oxygen as needed. CT ordered last month, pt did not get imaging done.   Patient was confused about what inhalers to use. He thought he was supposed to use Albuterol daily, not PRN. He states he has not used his Trelegy at all yet. Patient states he has not been using oxygen, states he used it once in the last month. He states he has not been using oxygen at night. Patient states his breathing has been about the same over the past month. He states he is constantly short of breath, but this is unchanged. Patient still smoking about 1/2 pack a day. Patient endorses he is going to stop smoking.   .. Active Ambulatory Problems    Diagnosis Date Noted  . Erectile dysfunction 11/04/2011  . Tobacco abuse 12/12/2012  . Depression 12/12/2012  . Primary osteoarthritis of right knee 04/24/2013  . Vitamin D deficiency 08/13/2013  . Pre-diabetes 08/16/2013  . Status post left knee replacement 08/13/2014  . Anxiety state 08/13/2014  . Essential hypertension, benign 08/13/2014  . Hyperlipidemia 02/24/2015  . Hypertriglyceridemia 02/24/2015  . CKD (chronic kidney disease), stage III 12/07/2015  . S/P total knee replacement, right 01/31/2017  . Bilateral cataracts 01/05/2018  . Posterior vitreous detachment of left eye 04/09/2018  . 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 S/P bilateral cataract extraction 05/25/2018  . Recurrent major depressive disorder, in partial remission ([place]) 06/19/2018  . Skipped heart beats 09/17/2018  . Strong pulse 09/17/2018  . Palpitation 09/19/2018  . Acute bilateral low back pain  without sciatica 01/28/2019  . PAF (paroxysmal atrial fibrillation) ([place]) 05/13/2019  . Low blood potassium 05/13/2019  . Acute encephalopathy 05/16/2019  . Pneumonia of both lungs due to infectious organism 05/16/2019  . COPD (chronic obstructive pulmonary disease) with chronic bronchitis ([place]) 05/16/2019  . Hypotension 05/29/2019  . History of COVID-19 06/11/2019  . Mucopurulent chronic bronchitis ([place]) 06/11/2019  . SOB (shortness of breath) on exertion 10/02/2019  . Cough 11/04/2019   Resolved Ambulatory Problems    Diagnosis Date Noted  . Hypertension, essential, benign 11/04/2011   Past Medical History:  Diagnosis Date  . COPD (chronic obstructive pulmonary disease) ([place])   . COVID-19 03/2019  . Diabetes mellitus without complication ([place])   . Hypertension   . Insomnia 2005  . Osteoarthritis 2000  . Paroxysmal A-fib ([place])   . Pneumonia    .Raymond Hardin Current Outpatient Medications on File Prior to Visit  Medication Sig Dispense Refill  . albuterol (VENTOLIN HFA) 108 (90 Base) MCG/ACT inhaler Inhale 2 puffs into the lungs every 6 (six) hours as needed. 54 g 1  . ALPRAZolam (XANAX) 0.5 MG tablet TAKE 1 TABLET(0.5 MG) BY MOUTH AT BEDTIME AS NEEDED FOR ANXIETY 30 tablet 5  . atorvastatin (LIPITOR) 20 MG tablet Take 1 tablet (20 mg total) by mouth daily. 90 tablet 1  . busPIRone (BUSPAR) 10 MG tablet Take 1 tablet (10 mg total) by mouth 3 (three) times daily. 270 tablet 1  . cholecalciferol (VITAMIN D) 1000 units tablet Take 1,000 Units by mouth daily.    Raymond Hardin  diclofenac sodium (VOLTAREN) 1 % GEL Apply 4 g topically 4 (four) times daily. To affected joint. 100 g 1  . fluticasone furoate-vilanterol (BREO ELLIPTA) 200-25 MCG/INH AEPB Inhale 1 puff into the lungs daily. 28 each 5  . Fluticasone-Umeclidin-Vilant (TRELEGY ELLIPTA) 100-62.5-25 MCG/INH AEPB Inhale 1 puff into the lungs daily. 60 each 5  . metFORMIN (GLUCOPHAGE) 500 MG tablet Take 1 tablet (500 mg total) by mouth 2 (two) times  daily with a meal. 180 tablet 1  . metoprolol succinate (TOPROL-XL) 100 MG 24 hr tablet Take 1 tablet (100 mg total) by mouth daily. 90 tablet 1  . sildenafil (REVATIO) 20 MG tablet TAKE 5 TABLETS BY MOUTH AS NEEDED FOR ERECTILE DYSFUNCTION 30 tablet 5  . valsartan (DIOVAN) 40 MG tablet TAKE 1 TABLET(40 MG) BY MOUTH DAILY 90 tablet 1  . vortioxetine HBr (TRINTELLIX) 10 MG TABS tablet TAKE 1 TABLET(10 MG) BY MOUTH DAILY 90 tablet 1  . XARELTO 20 MG TABS tablet Take 1 tablet (20 mg total) by mouth daily. 90 tablet 3   No current facility-administered medications on file prior to visit.     Review of Systems  Constitutional: Negative.   HENT: Positive for congestion.   Respiratory: Positive for cough and shortness of breath. Negative for wheezing.   Cardiovascular: Negative.   Gastrointestinal: Negative.   Genitourinary: Negative.   Musculoskeletal: Negative.   Skin: Negative.   Psychiatric/Behavioral: Negative.        Objective:   Physical Exam Constitutional:      Appearance: Normal appearance. He is obese.  HENT:     Head: Normocephalic and atraumatic.  Cardiovascular:     Rate and Rhythm: Normal rate and regular rhythm.     Heart sounds: Normal heart sounds.  Pulmonary:     Effort: Pulmonary effort is normal.     Breath sounds: Decreased breath sounds present.  Skin:    General: Skin is warm and dry.  Neurological:     General: No focal deficit present.     Mental Status: He is alert and oriented to person, place, and time.  Psychiatric:        Mood and Affect: Mood normal.        Behavior: Behavior normal.        Assessment & Plan:  Raymond KitchenMarland KitchenKeniel was seen today for spirometry.  Diagnoses and all orders for this visit:  Cough -     albuterol (VENTOLIN HFA) 108 (90 Base) MCG/ACT inhaler 2 puff  SOB (shortness of breath) on exertion -     albuterol (VENTOLIN HFA) 108 (90 Base) MCG/ACT inhaler 2 puff  COPD (chronic obstructive pulmonary disease) with chronic  bronchitis (HCC) -     albuterol (VENTOLIN HFA) 108 (90 Base) MCG/ACT inhaler 2 puff    - Spirometry testing was inconclusive. Patient used albuterol inhaler this morning before testing this afternoon. Testing results showed either invalid testing or restrictive disease. More likely invalid testing due to already being bronchodilated prior to starting test.  - Educated patient on when to use Albuterol PRN vs using Trelegy daily.   - Discussed smoking cessation, patient states he is going to stop. He will try nicotine gum and patches.    CT order already placed. Discussed with patient importance of getting imaging done.  Follow up in one month or sooner if needed.   Raymond KitchenVernetta Honey PA-C, have reviewed and agree with the above documentation in it's entirety.

## 2019-10-30 NOTE — Progress Notes (Signed)
Pt here for spirometry. He was not very successful with the testing however he did try to do his best.

## 2019-10-30 NOTE — Patient Instructions (Addendum)
[facility] appts: 11/27/2019 at 10:50 am 12/11/2019 at 10:20 am  Address: [address], [place], [place] [address] Phone number: [phone]  Start Trelegy for next 4 weeks.  Get CT of chest.

## 2019-11-04 DIAGNOSIS — R059 Cough, unspecified: Secondary | ICD-10-CM | POA: Insufficient documentation

## 2019-11-10 DIAGNOSIS — U071 COVID-19: Secondary | ICD-10-CM | POA: Diagnosis not present

## 2019-11-14 NOTE — Addendum Note (Signed)
Addended by: Narda Rutherford on: 11/14/2019 07:51 AM   Modules accepted: Orders

## 2019-11-19 ENCOUNTER — Encounter: Payer: Self-pay | Admitting: Gastroenterology

## 2019-11-26 ENCOUNTER — Ambulatory Visit: Payer: Medicare HMO | Admitting: Physician Assistant

## 2019-12-02 ENCOUNTER — Other Ambulatory Visit: Payer: Self-pay | Admitting: Physician Assistant

## 2019-12-02 DIAGNOSIS — I10 Essential (primary) hypertension: Secondary | ICD-10-CM

## 2019-12-03 ENCOUNTER — Other Ambulatory Visit: Payer: Self-pay | Admitting: Neurology

## 2019-12-03 DIAGNOSIS — F411 Generalized anxiety disorder: Secondary | ICD-10-CM

## 2019-12-03 MED ORDER — ALPRAZOLAM 0.5 MG PO TABS: ORAL_TABLET | ORAL | 5 refills | Status: DC

## 2019-12-03 NOTE — Telephone Encounter (Signed)
Last filled 06/17/2019 #30 with 5 refills Last appt 10/30/2019 [facility] patient

## 2019-12-04 ENCOUNTER — Other Ambulatory Visit: Payer: Self-pay

## 2019-12-04 DIAGNOSIS — F411 Generalized anxiety disorder: Secondary | ICD-10-CM

## 2019-12-04 MED ORDER — ALPRAZOLAM 0.5 MG PO TABS: ORAL_TABLET | ORAL | 5 refills | Status: DC

## 2019-12-04 NOTE — Telephone Encounter (Signed)
I called [employer] and they state they have not received the prescription.

## 2019-12-04 NOTE — Telephone Encounter (Signed)
Refill on Xanax was sent to mail order. He needs the prescription sent to [facility].

## 2019-12-11 ENCOUNTER — Encounter: Payer: Medicare HMO | Admitting: Gastroenterology

## 2019-12-11 DIAGNOSIS — U071 COVID-19: Secondary | ICD-10-CM | POA: Diagnosis not present

## 2019-12-18 ENCOUNTER — Ambulatory Visit: Payer: Medicare HMO | Admitting: Gastroenterology

## 2019-12-18 ENCOUNTER — Ambulatory Visit: Payer: Medicare HMO | Admitting: Nurse Practitioner

## 2020-01-03 ENCOUNTER — Encounter: Payer: Self-pay | Admitting: Physician Assistant

## 2020-01-03 ENCOUNTER — Telehealth (INDEPENDENT_AMBULATORY_CARE_PROVIDER_SITE_OTHER): Payer: Medicare HMO | Admitting: Physician Assistant

## 2020-01-03 DIAGNOSIS — J449 Chronic obstructive pulmonary disease, unspecified: Secondary | ICD-10-CM | POA: Diagnosis not present

## 2020-01-03 DIAGNOSIS — Z720 Tobacco use: Secondary | ICD-10-CM

## 2020-01-03 DIAGNOSIS — I10 Essential (primary) hypertension: Secondary | ICD-10-CM

## 2020-01-03 DIAGNOSIS — J411 Mucopurulent chronic bronchitis: Secondary | ICD-10-CM

## 2020-01-03 DIAGNOSIS — I480 Paroxysmal atrial fibrillation: Secondary | ICD-10-CM

## 2020-01-03 NOTE — Progress Notes (Signed)
..Virtual Visit via Telephone Note  I connected with Raymond Hardin on 01/03/2020 at  3:40 PM EDT by telephone and verified that I am speaking with the correct person using two identifiers.  Location: Patient: home Provider: clinic   I discussed the limitations, risks, security and privacy concerns of performing an evaluation and management service by telephone and the availability of in person appointments. I also discussed with the patient that there may be a patient responsible charge related to this service. The patient expressed understanding and agreed to proceed.   History of Present Illness: Pt is a 79 yo male with COPD, HTN, PAF who continues to smoke and calls into the clinic for follow up after starting trelegy.   He is doing much better breathing on Trelegy. He reports to be not using is O2 hardly at all. He will occasionally use his rescue inhaler. He still has not gotten the CT scan. Pt denies any CP, palpitations, headaches. He has no ongoing concerns.   He reports to be complaint with medications.   .. Active Ambulatory Problems    Diagnosis Date Noted  . Erectile dysfunction 11/04/2011  . Tobacco abuse 12/12/2012  . Depression 12/12/2012  . Primary osteoarthritis of right knee 04/24/2013  . Vitamin D deficiency 08/13/2013  . Pre-diabetes 08/16/2013  . Status post left knee replacement 08/13/2014  . Anxiety state 08/13/2014  . Essential hypertension, benign 08/13/2014  . Hyperlipidemia 02/24/2015  . Hypertriglyceridemia 02/24/2015  . CKD (chronic kidney disease), stage III 12/07/2015  . S/P total knee replacement, right 01/31/2017  . Bilateral cataracts 01/05/2018  . Posterior vitreous detachment of left eye 04/09/2018  . 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 S/P bilateral cataract extraction 05/25/2018  . Recurrent major depressive disorder, in partial remission ([place]) 06/19/2018  . Skipped heart beats 09/17/2018  . Strong pulse 09/17/2018  . Palpitation 09/19/2018   . Acute bilateral low back pain without sciatica 01/28/2019  . PAF (paroxysmal atrial fibrillation) ([place]) 05/13/2019  . Low blood potassium 05/13/2019  . Acute encephalopathy 05/16/2019  . Pneumonia of both lungs due to infectious organism 05/16/2019  . COPD (chronic obstructive pulmonary disease) with chronic bronchitis ([place]) 05/16/2019  . Hypotension 05/29/2019  . History of COVID-19 06/11/2019  . Mucopurulent chronic bronchitis ([place]) 06/11/2019  . SOB (shortness of breath) on exertion 10/02/2019  . Cough 11/04/2019   Resolved Ambulatory Problems    Diagnosis Date Noted  . Hypertension, essential, benign 11/04/2011   Past Medical History:  Diagnosis Date  . COPD (chronic obstructive pulmonary disease) ([place])   . COVID-19 03/2019  . Diabetes mellitus without complication ([place])   . Hypertension   . Insomnia 2005  . Osteoarthritis 2000  . Paroxysmal A-fib ([place])   . Pneumonia    Reviewed med, allergy, problem list.      Observations/Objective: No acute distress No labored breathing.   NO vitals obtained.    Assessment and Plan: Marland KitchenMarland KitchenMessiah was seen today for follow-up.  Diagnoses and all orders for this visit:  COPD (chronic obstructive pulmonary disease) with chronic bronchitis ([facility])  Mucopurulent chronic bronchitis ([facility])  Essential hypertension, benign  PAF (paroxysmal atrial fibrillation) ([facility])  Tobacco abuse   Encouraged by response to Trelegy. Continue this with as needed albuterol and O2. Encouraged to schedule CT scan for further work up.   Pt declined smoking cessation.    Follow Up Instructions:    I discussed the assessment and treatment plan with the patient. The patient was provided an opportunity to ask  questions and all were answered. The patient agreed with the plan and demonstrated an understanding of the instructions.   The patient was advised to call back or seek an in-person evaluation if the symptoms worsen or if the condition fails to  improve as anticipated.  I provided 5 minutes of non-face-to-face time during this encounter.   Iran Planas, PA-C

## 2020-01-06 ENCOUNTER — Encounter: Payer: Self-pay | Admitting: Physician Assistant

## 2020-01-11 DIAGNOSIS — U071 COVID-19: Secondary | ICD-10-CM | POA: Diagnosis not present

## 2020-02-05 ENCOUNTER — Ambulatory Visit: Payer: Medicare HMO

## 2020-02-10 DIAGNOSIS — U071 COVID-19: Secondary | ICD-10-CM | POA: Diagnosis not present

## 2020-02-19 ENCOUNTER — Ambulatory Visit: Payer: Medicare HMO | Admitting: Gastroenterology

## 2020-02-24 ENCOUNTER — Encounter: Payer: Self-pay | Admitting: Physician Assistant

## 2020-02-28 ENCOUNTER — Encounter: Payer: Medicare HMO | Admitting: Physician Assistant

## 2020-03-12 DIAGNOSIS — U071 COVID-19: Secondary | ICD-10-CM | POA: Diagnosis not present

## 2020-03-23 ENCOUNTER — Telehealth: Payer: Self-pay | Admitting: Neurology

## 2020-03-23 NOTE — Telephone Encounter (Signed)
Received note from [facility] to contact patient at [phone] to discuss his blood pressure medication and what he should be taking.   Called patient and let him know he should be on Valsartan 40 mg daily for HTN and Metoprolol 100 mg for Atrial Fib. He expressed understanding. This is what he has been taking.

## 2020-04-28 ENCOUNTER — Telehealth: Payer: Self-pay | Admitting: Physician Assistant

## 2020-04-28 ENCOUNTER — Encounter: Payer: Medicare HMO | Admitting: Physician Assistant

## 2020-04-28 NOTE — Telephone Encounter (Signed)
Ok to take off schedule 

## 2020-04-28 NOTE — Telephone Encounter (Signed)
Pt called at [phone] to cancel this appt.

## 2020-05-04 NOTE — Telephone Encounter (Signed)
Error

## 2020-05-27 ENCOUNTER — Other Ambulatory Visit: Payer: Self-pay

## 2020-05-27 DIAGNOSIS — F411 Generalized anxiety disorder: Secondary | ICD-10-CM

## 2020-05-27 NOTE — Telephone Encounter (Signed)
Pt stated he needs a refill on his Xanax.

## 2020-05-29 ENCOUNTER — Other Ambulatory Visit: Payer: Self-pay

## 2020-05-29 DIAGNOSIS — F411 Generalized anxiety disorder: Secondary | ICD-10-CM

## 2020-05-29 MED ORDER — ALPRAZOLAM 0.5 MG PO TABS: ORAL_TABLET | ORAL | 1 refills | Status: DC

## 2020-05-29 NOTE — Telephone Encounter (Signed)
Rx sent early today for one month does need appt though.

## 2020-05-29 NOTE — Telephone Encounter (Signed)
Last written in July for 6 months.

## 2020-05-29 NOTE — Telephone Encounter (Signed)
Unable to leave a message,mailbox is full. 

## 2020-05-29 NOTE — Telephone Encounter (Signed)
Rx sent will need 6 month follow up appt in next month.

## 2020-05-29 NOTE — Telephone Encounter (Signed)
Please call to schedule follow up appt for patient.

## 2020-05-29 NOTE — Telephone Encounter (Signed)
Patient scheduled for 06/15/20. AM

## 2020-06-08 ENCOUNTER — Ambulatory Visit (INDEPENDENT_AMBULATORY_CARE_PROVIDER_SITE_OTHER): Payer: Medicare HMO | Admitting: Physician Assistant

## 2020-06-08 ENCOUNTER — Other Ambulatory Visit: Payer: Self-pay

## 2020-06-08 VITALS — BP 144/78 | HR 89 | Temp 97.8°F | Resp 18 | Ht 70.5 in | Wt 225.0 lb

## 2020-06-08 VITALS — BP 144/78 | HR 89 | Temp 97.8°F | Ht 70.5 in | Wt 225.0 lb

## 2020-06-08 DIAGNOSIS — M545 Low back pain, unspecified: Secondary | ICD-10-CM

## 2020-06-08 DIAGNOSIS — I480 Paroxysmal atrial fibrillation: Secondary | ICD-10-CM | POA: Diagnosis not present

## 2020-06-08 DIAGNOSIS — R7303 Prediabetes: Secondary | ICD-10-CM

## 2020-06-08 DIAGNOSIS — J449 Chronic obstructive pulmonary disease, unspecified: Secondary | ICD-10-CM

## 2020-06-08 DIAGNOSIS — N1831 Chronic kidney disease, stage 3a: Secondary | ICD-10-CM

## 2020-06-08 DIAGNOSIS — I10 Essential (primary) hypertension: Secondary | ICD-10-CM

## 2020-06-08 DIAGNOSIS — Z0000 Encounter for general adult medical examination without abnormal findings: Secondary | ICD-10-CM

## 2020-06-08 DIAGNOSIS — E782 Mixed hyperlipidemia: Secondary | ICD-10-CM

## 2020-06-08 DIAGNOSIS — F411 Generalized anxiety disorder: Secondary | ICD-10-CM

## 2020-06-08 DIAGNOSIS — Z79899 Other long term (current) drug therapy: Secondary | ICD-10-CM

## 2020-06-08 DIAGNOSIS — J4489 Other specified chronic obstructive pulmonary disease: Secondary | ICD-10-CM

## 2020-06-08 DIAGNOSIS — W19XXXD Unspecified fall, subsequent encounter: Secondary | ICD-10-CM | POA: Diagnosis not present

## 2020-06-08 DIAGNOSIS — F3341 Major depressive disorder, recurrent, in partial remission: Secondary | ICD-10-CM

## 2020-06-08 MED ORDER — BUSPIRONE HCL 15 MG PO TABS: 15.0000 mg | ORAL_TABLET | Freq: Three times a day (TID) | ORAL | 0 refills | Status: DC

## 2020-06-08 MED ORDER — KETOROLAC TROMETHAMINE 60 MG/2ML IM SOLN
60.0000 mg | Freq: Once | INTRAMUSCULAR | Status: AC
  Administered 2020-06-08: 60 mg via INTRAMUSCULAR

## 2020-06-08 MED ORDER — VORTIOXETINE HBR 20 MG PO TABS
20.0000 mg | ORAL_TABLET | Freq: Every day | ORAL | 0 refills | Status: DC
Start: 2020-06-08 — End: 2020-07-06

## 2020-06-08 MED ORDER — PREDNISONE 50 MG PO TABS: ORAL_TABLET | ORAL | 0 refills | Status: DC

## 2020-06-08 MED ORDER — TRAMADOL HCL 50 MG PO TABS: 50.0000 mg | ORAL_TABLET | Freq: Three times a day (TID) | ORAL | 0 refills | Status: AC | PRN

## 2020-06-08 NOTE — Patient Instructions (Addendum)
 Fall Prevention in the Home, Adult Falls can cause injuries and can happen to people of all ages. There are many things you can do to make your home safe and to help prevent falls. Ask for help when making these changes. What actions can I take to prevent falls? General Instructions  Use good lighting in all rooms. Replace any light bulbs that burn out.  Turn on the lights in dark areas. Use night-lights.  Keep items that you use often in easy-to-reach places. Lower the shelves around your home if needed.  Set up your furniture so you have a clear path. Avoid moving your furniture around.  Do not have throw rugs or other things on the floor that can make you trip.  Avoid walking on wet floors.  If any of your floors are uneven, fix them.  Add color or contrast paint or tape to clearly mark and help you see: ? Grab bars or handrails. ? First and last steps of staircases. ? Where the edge of each step is.  If you use a stepladder: ? Make sure that it is fully opened. Do not climb a closed stepladder. ? Make sure the sides of the stepladder are locked in place. ? Ask someone to hold the stepladder while you use it.  Know where your pets are when moving through your home. What can I do in the bathroom?  Keep the floor dry. Clean up any water on the floor right away.  Remove soap buildup in the tub or shower.  Use nonskid mats or decals on the floor of the tub or shower.  Attach bath mats securely with double-sided, nonslip rug tape.  If you need to sit down in the shower, use a plastic, nonslip stool.  Install grab bars by the toilet and in the tub and shower. Do not use towel bars as grab bars.      What can I do in the bedroom?  Make sure that you have a light by your bed that is easy to reach.  Do not use any sheets or blankets for your bed that hang to the floor.  Have a firm chair with side arms that you can use for support when you get dressed. What can I do  in the kitchen?  Clean up any spills right away.  If you need to reach something above you, use a step stool with a grab bar.  Keep electrical cords out of the way.  Do not use floor polish or wax that makes floors slippery. What can I do with my stairs?  Do not leave any items on the stairs.  Make sure that you have a light switch at the top and the bottom of the stairs.  Make sure that there are handrails on both sides of the stairs. Fix handrails that are broken or loose.  Install nonslip stair treads on all your stairs.  Avoid having throw rugs at the top or bottom of the stairs.  Choose a carpet that does not hide the edge of the steps on the stairs.  Check carpeting to make sure that it is firmly attached to the stairs. Fix carpet that is loose or worn. What can I do on the outside of my home?  Use bright outdoor lighting.  Fix the edges of walkways and driveways and fix any cracks.  Remove anything that might make you trip as you walk through a door, such as a raised step or threshold.  Trim   any bushes or trees on paths to your home.  Check to see if handrails are loose or broken and that both sides of all steps have handrails.  Install guardrails along the edges of any raised decks and porches.  Clear paths of anything that can make you trip, such as tools or rocks.  Have leaves, snow, or ice cleared regularly.  Use sand or salt on paths during winter.  Clean up any spills in your garage right away. This includes grease or oil spills. What other actions can I take?  Wear shoes that: ? Have a low heel. Do not wear high heels. ? Have rubber bottoms. ? Feel good on your feet and fit well. ? Are closed at the toe. Do not wear open-toe sandals.  Use tools that help you move around if needed. These include: ? Canes. ? Walkers. ? Scooters. ? Crutches.  Review your medicines with your doctor. Some medicines can make you feel dizzy. This can increase your  chance of falling. Ask your doctor what else you can do to help prevent falls. Where to find more information  Centers for Disease Control and Prevention, [employer]: www.cdc.gov  National Institute on Aging: www.nia.nih.gov Contact a doctor if:  You are afraid of falling at home.  You feel weak, drowsy, or dizzy at home.  You fall at home. Summary  There are many simple things that you can do to make your home safe and to help prevent falls.  Ways to make your home safe include removing things that can make you trip and installing grab bars in the bathroom.  Ask for help when making these changes in your home. This information is not intended to replace advice given to you by your health care provider. Make sure you discuss any questions you have with your health care provider. Document Revised: 11/27/2019 Document Reviewed: 11/27/2019 Elsevier Patient Education  2021 Elsevier Inc.   Health Maintenance, Male Adopting a healthy lifestyle and getting preventive care are important in promoting health and wellness. Ask your health care provider about:  The right schedule for you to have regular tests and exams.  Things you can do on your own to prevent diseases and keep yourself healthy. What should I know about diet, weight, and exercise? Eat a healthy diet  Eat a diet that includes plenty of vegetables, fruits, low-fat dairy products, and lean protein.  Do not eat a lot of foods that are high in solid fats, added sugars, or sodium.   Maintain a healthy weight Body mass index (BMI) is a measurement that can be used to identify possible weight problems. It estimates body fat based on height and weight. Your health care provider can help determine your BMI and help you achieve or maintain a healthy weight. Get regular exercise Get regular exercise. This is one of the most important things you can do for your health. Most adults should:  Exercise for at least 150 minutes each week.  The exercise should increase your heart rate and make you sweat (moderate-intensity exercise).  Do strengthening exercises at least twice a week. This is in addition to the moderate-intensity exercise.  Spend less time sitting. Even light physical activity can be beneficial. Watch cholesterol and blood lipids Have your blood tested for lipids and cholesterol at 78 years of age, then have this test every 5 years. You may need to have your cholesterol levels checked more often if:  Your lipid or cholesterol levels are high.  You are   older than 78 years of age.  You are at high risk for heart disease. What should I know about cancer screening? Many types of cancers can be detected early and may often be prevented. Depending on your health history and family history, you may need to have cancer screening at various ages. This may include screening for:  Colorectal cancer.  Prostate cancer.  Skin cancer.  Lung cancer. What should I know about heart disease, diabetes, and high blood pressure? Blood pressure and heart disease  High blood pressure causes heart disease and increases the risk of stroke. This is more likely to develop in people who have high blood pressure readings, are of African descent, or are overweight.  Talk with your health care provider about your target blood pressure readings.  Have your blood pressure checked: ? Every 3-5 years if you are 18-39 years of age. ? Every year if you are 40 years old or older.  If you are between the ages of 65 and 75 and are a current or former smoker, ask your health care provider if you should have a one-time screening for abdominal aortic aneurysm (AAA). Diabetes Have regular diabetes screenings. This checks your fasting blood sugar level. Have the screening done:  Once every three years after age 45 if you are at a normal weight and have a low risk for diabetes.  More often and at a younger age if you are overweight or have a  high risk for diabetes. What should I know about preventing infection? Hepatitis B If you have a higher risk for hepatitis B, you should be screened for this virus. Talk with your health care provider to find out if you are at risk for hepatitis B infection. Hepatitis C Blood testing is recommended for:  Everyone born from 1945 through 1965.  Anyone with known risk factors for hepatitis C. Sexually transmitted infections (STIs)  You should be screened each year for STIs, including gonorrhea and chlamydia, if: ? You are sexually active and are younger than 78 years of age. ? You are older than 78 years of age and your health care provider tells you that you are at risk for this type of infection. ? Your sexual activity has changed since you were last screened, and you are at increased risk for chlamydia or gonorrhea. Ask your health care provider if you are at risk.  Ask your health care provider about whether you are at high risk for HIV. Your health care provider may recommend a prescription medicine to help prevent HIV infection. If you choose to take medicine to prevent HIV, you should first get tested for HIV. You should then be tested every 3 months for as long as you are taking the medicine. Follow these instructions at home: Lifestyle  Do not use any products that contain nicotine or tobacco, such as cigarettes, e-cigarettes, and chewing tobacco. If you need help quitting, ask your health care provider.  Do not use street drugs.  Do not share needles.  Ask your health care provider for help if you need support or information about quitting drugs. Alcohol use  Do not drink alcohol if your health care provider tells you not to drink.  If you drink alcohol: ? Limit how much you have to 0-2 drinks a day. ? Be aware of how much alcohol is in your drink. In the [place] one drink equals one 12 oz bottle of beer (355 mL), one 5 oz glass of wine (148 mL), or   one 1 oz glass of hard  liquor (44 mL). General instructions  Schedule regular health, dental, and eye exams.  Stay current with your vaccines.  Tell your health care provider if: ? You often feel depressed. ? You have ever been abused or do not feel safe at home. Summary  Adopting a healthy lifestyle and getting preventive care are important in promoting health and wellness.  Follow your health care provider's instructions about healthy diet, exercising, and getting tested or screened for diseases.  Follow your health care provider's instructions on monitoring your cholesterol and blood pressure. This information is not intended to replace advice given to you by your health care provider. Make sure you discuss any questions you have with your health care provider. Document Revised: 04/18/2018 Document Reviewed: 04/18/2018 Elsevier Patient Education  2021 [place].   Steps to Quit Smoking Smoking tobacco is the leading cause of preventable death. It can affect almost every organ in the body. Smoking puts you and people around you at risk for many serious, long-lasting (chronic) diseases. Quitting smoking can be hard, but it is one of the best things that you can do for your health. It is never too late to quit. How do I get ready to quit? When you decide to quit smoking, make a plan to help you succeed. Before you quit:  Pick a date to quit. Set a date within the next 2 weeks to give you time to prepare.  Write down the reasons why you are quitting. Keep this list in places where you will see it often.  Tell your family, friends, and co-workers that you are quitting. Their support is important.  Talk with your doctor about the choices that may help you quit.  Find out if your health insurance will pay for these treatments.  Know the people, places, things, and activities that make you want to smoke (triggers). Avoid them. What first steps can I take to quit smoking?  Throw away all cigarettes at  home, at work, and in your car.  Throw away the things that you use when you smoke, such as ashtrays and lighters.  Clean your car. Make sure to empty the ashtray.  Clean your home, including curtains and carpets. What can I do to help me quit smoking? Talk with your doctor about taking medicines and seeing a counselor at the same time. You are more likely to succeed when you do both.  If you are pregnant or breastfeeding, talk with your doctor about counseling or other ways to quit smoking. Do not take medicine to help you quit smoking unless your doctor tells you to do so. To quit smoking: Quit right away  Quit smoking totally, instead of slowly cutting back on how much you smoke over a period of time.  Go to counseling. You are more likely to quit if you go to counseling sessions regularly. Take medicine You may take medicines to help you quit. Some medicines need a prescription, and some you can buy over-the-counter. Some medicines may contain a drug called nicotine to replace the nicotine in cigarettes. Medicines may:  Help you to stop having the desire to smoke (cravings).  Help to stop the problems that come when you stop smoking (withdrawal symptoms). Your doctor may ask you to use:  Nicotine patches, gum, or lozenges.  Nicotine inhalers or sprays.  Non-nicotine medicine that is taken by mouth. Find resources Find resources and other ways to help you quit smoking and remain smoke-free  after you quit. These resources are most helpful when you use them often. They include:  Online chats with a Social worker.  Phone quitlines.  Printed Furniture conservator/restorer.  Support groups or group counseling.  Text messaging programs.  Mobile phone apps. Use apps on your mobile phone or tablet that can help you stick to your quit plan. There are many free apps for mobile phones and tablets as well as websites. Examples include Quit Guide from the [employer] and smokefree.gov   What things can I  do to make it easier to quit?  Talk to your family and friends. Ask them to support and encourage you.  Call a phone quitline (1-800-QUIT-NOW), reach out to support groups, or work with a Social worker.  Ask people who smoke to not smoke around you.  Avoid places that make you want to smoke, such as: ? Bars. ? Parties. ? Smoke-break areas at work.  Spend time with people who do not smoke.  Lower the stress in your life. Stress can make you want to smoke. Try these things to help your stress: ? Getting regular exercise. ? Doing deep-breathing exercises. ? Doing yoga. ? Meditating. ? Doing a body scan. To do this, close your eyes, focus on one area of your body at a time from head to toe. Notice which parts of your body are tense. Try to relax the muscles in those areas.   How will I feel when I quit smoking? Day 1 to 3 weeks Within the first 24 hours, you may start to have some problems that come from quitting tobacco. These problems are very bad 2-3 days after you quit, but they do not often last for more than 2-3 weeks. You may get these symptoms:  Mood swings.  Feeling restless, nervous, angry, or annoyed.  Trouble concentrating.  Dizziness.  Strong desire for high-sugar foods and nicotine.  Weight gain.  Trouble pooping (constipation).  Feeling like you may vomit (nausea).  Coughing or a sore throat.  Changes in how the medicines that you take for other issues work in your body.  Depression.  Trouble sleeping (insomnia). Week 3 and afterward After the first 2-3 weeks of quitting, you may start to notice more positive results, such as:  Better sense of smell and taste.  Less coughing and sore throat.  Slower heart rate.  Lower blood pressure.  Clearer skin.  Better breathing.  Fewer sick days. Quitting smoking can be hard. Do not give up if you fail the first time. Some people need to try a few times before they succeed. Do your best to stick to your quit  plan, and talk with your doctor if you have any questions or concerns. Summary  Smoking tobacco is the leading cause of preventable death. Quitting smoking can be hard, but it is one of the best things that you can do for your health.  When you decide to quit smoking, make a plan to help you succeed.  Quit smoking right away, not slowly over a period of time.  When you start quitting, seek help from your doctor, family, or friends. This information is not intended to replace advice given to you by your health care provider. Make sure you discuss any questions you have with your health care provider. Document Revised: 01/18/2019 Document Reviewed: 07/14/2018 Elsevier Patient Education  2021 [place] Maintenance Summary and Written Plan of Care  Raymond Hardin ,  Thank you for allowing me to perform  your Medicare Annual Wellness Visit and for your ongoing commitment to your health.   Health Maintenance & Immunization History Health Maintenance  Topic Date Due  . TETANUS/TDAP  01/06/2028  . INFLUENZA VACCINE  Completed  . COVID-19 Vaccine  Completed  . Hepatitis C Screening  Completed  . PNA vac Low Risk Adult  Completed   Immunization History  Administered Date(s) Administered  . Fluad Quad(high Dose 65+) 01/25/2019  . Influenza,inj,Quad PF,6+ Mos 01/23/2013, 02/23/2015, 03/01/2016, 01/31/2017, 01/05/2018  . Influenza-Unspecified 05/15/2020  . Janssen (J&J) SARS-COV-2 Vaccination 09/01/2019, 05/29/2020  . Pneumococcal Conjugate-13 08/13/2014  . Pneumococcal Polysaccharide-23 07/01/2011  . Tdap 01/05/2018    These are the patient goals that we discussed: Goals Addressed              This Visit's Progress   .  Patient Stated (pt-stated)        06/08/2020 AWV Goal: Tobacco Cessation  Smoking cessation instruction/counseling given:  counseled patient on the dangers of tobacco use, advised patient to stop smoking, and reviewed  strategies to maximize success   Patient will verbalize understanding of the health risks associated with smoking/tobacco use  Lung cancer or lung disease, such as COPD  Heart disease.  Stroke.  Heart attack  Infertility  Osteoporosis and bone fractures. . Patient will create a plan to quit smoking/using tobacco  Pick a date to quit.   Write down the reasons why you are quitting and put it where you will see it often.  Identify the people, places, things, and activities that make you want to smoke (triggers) and avoid them. Make sure to take these actions: ? Throw away all cigarettes at home, at work, and in your car. ? Throw away smoking accessories, such as Scientist, research (medical). ? Clean your car and make sure to empty the ashtray. ? Clean your home, including curtains and carpets.  Tell your family, friends, and coworkers that you are quitting. Support from your loved ones can make quitting easier.  Talk with your health care provider about your options for quitting smoking.  Find out what treatment options are covered by your health insurance. . Patient will be able to demonstrate knowledge of tobacco cessation strategies that may maximize success  Quitting "cold [place]" is more successful than gradually quitting.  Attending in-person counseling to help you build problem-solving skills.   Finding resources and support systems that can help you to quit smoking and remain smoke-free after you quit. These resources are most helpful when you use them often. They can include: ? Online chats with a Social worker. ? Telephone quitlines. ? Careers information officer. ? Support groups or group counseling. ? Text messaging programs. ? Mobile phone applications.  Taking medicines to help you quit smoking: ? Nicotine patches, gum, or lozenges. ? Nicotine inhalers or sprays. ? Non-nicotine medicine that is taken by mouth. . Patient will note get discouraged if the process is  difficult . Over the next year, patient will stop smoking or using other forms of tobacco  Smoking cessation instruction/counseling given:  counseled patient on the dangers of tobacco use, advised patient to stop smoking, and reviewed strategies to maximize success          This is a list of Health Maintenance Items that are overdue or due now: Smoking cessation counseling  Shingrix  Orders/Referrals Placed Today: No orders of the defined types were placed in this encounter.   Follow-up Plan . Follow-up with Donella Stade, PA-C as planned .  Schedule your appointment with Luvenia Starch to discuss your current depression treatment and your back pain. . Schedule your appointment at the pharmacy for your shingrix vaccine.

## 2020-06-08 NOTE — Progress Notes (Signed)
MEDICARE ANNUAL WELLNESS VISIT  06/08/2020  Subjective:  Raymond Hardin is a 80 y.o. male patient of Alden Hipp, Royetta Car, PA-C who had a [employer] Visit today. Raymond Hardin is Retired and lives in a boarding home. he has 1 children (hasn't spoken to in four years). he reports that he is socially active and does interact with friends/family regularly. he is minimally physically active and enjoys riding his motorcycle but currently doesn't have one.  Patient Care Team: Lavada Mesi as PCP - General (Family Medicine)  Advanced Directives 06/08/2020 02/04/2019 01/22/2018  Does Patient Have a Medical Advance Directive? Yes No No  Type of Paramedic of [place];Living will - -  Does patient want to make changes to medical advance directive? No - Patient declined - -  Copy of [place] in Chart? No - copy requested - -  Would patient like information on creating a medical advance directive? - No - Patient declined Yes (MAU/Ambulatory/Procedural Areas - Information given)    Hospital Utilization Over the Past 12 Months: # of hospitalizations or ER visits: 1 # of surgeries: 0  Review of Systems    Patient reports that his overall health is better when compared to last year.  Review of Systems: History obtained from chart review and the patient  All other systems negative.  Pain Assessment Pain : 0-10 Pain Score: 5  Pain Type: Chronic pain Pain Location: Back Pain Orientation: Lower Pain Descriptors / Indicators: Aching Pain Onset: More than a month ago Pain Frequency: Constant Pain Relieving Factors: none Effect of Pain on Daily Activities: none  Pain Relieving Factors: none  Current Medications & Allergies (verified) Allergies as of 06/08/2020   No Known Allergies     Medication List       Accurate as of June 08, 2020  9:51 AM. If you have any questions, ask your nurse or doctor.        albuterol 108 (90  Base) MCG/ACT inhaler Commonly known as: VENTOLIN HFA Inhale 2 puffs into the lungs every 6 (six) hours as needed.   ALPRAZolam 0.5 MG tablet Commonly known as: XANAX TAKE 1 TABLET(0.5 MG) BY MOUTH AT BEDTIME AS NEEDED FOR ANXIETY   atorvastatin 20 MG tablet Commonly known as: LIPITOR Take 1 tablet (20 mg total) by mouth daily.   busPIRone 10 MG tablet Commonly known as: BUSPAR Take 1 tablet (10 mg total) by mouth 3 (three) times daily.   cholecalciferol 1000 units tablet Commonly known as: VITAMIN D Take 1,000 Units by mouth daily.   diclofenac sodium 1 % Gel Commonly known as: VOLTAREN Apply 4 g topically 4 (four) times daily. To affected joint.   metFORMIN 500 MG tablet Commonly known as: GLUCOPHAGE Take 1 tablet (500 mg total) by mouth 2 (two) times daily with a meal.   metoprolol succinate 100 MG 24 hr tablet Commonly known as: TOPROL-XL Take 1 tablet (100 mg total) by mouth daily.   sildenafil 20 MG tablet Commonly known as: REVATIO TAKE 5 TABLETS BY MOUTH AS NEEDED FOR ERECTILE DYSFUNCTION   Trelegy Ellipta 100-62.5-25 MCG/INH Aepb Generic drug: Fluticasone-Umeclidin-Vilant Inhale 1 puff into the lungs daily.   valsartan 40 MG tablet Commonly known as: DIOVAN TAKE 1 TABLET(40 MG) BY MOUTH DAILY   vortioxetine HBr 10 MG Tabs tablet Commonly known as: Trintellix TAKE 1 TABLET(10 MG) BY MOUTH DAILY   Xarelto 20 MG Tabs tablet Generic drug: rivaroxaban Take 1 tablet (20 mg total)  by mouth daily.       History (reviewed): Past Medical History:  Diagnosis Date  . COPD (chronic obstructive pulmonary disease) ([place])   . COVID-19 03/2019  . Depression   . Diabetes mellitus without complication ([place])   . Hyperlipidemia   . Hypertension   . Insomnia 2005  . Osteoarthritis 2000  . Paroxysmal A-fib ([place])   . Pneumonia    Past Surgical History:  Procedure Laterality Date  . CATARACT EXTRACTION, BILATERAL  2020  . EYE SURGERY    . JOINT REPLACEMENT     . left shoulder replacement  2011  . REPLACEMENT TOTAL KNEE Bilateral    Family History  Problem Relation Age of Onset  . Cancer Mother   . Suicidality Father 56  . Depression Father   . COPD Sister    Social History   Socioeconomic History  . Marital status: Legally Separated    Spouse name: Not on file  . Number of children: 1  . Years of education: 52  . Highest education level: 12th grade  Occupational History  . Occupation: retired    Comment: truck Geophysicist/field seismologist  Tobacco Use  . Smoking status: Current Every Day Smoker    Packs/day: 0.50    Years: 60.00    Pack years: 30.00    Types: Cigarettes  . Smokeless tobacco: Current User    Types: Chew  Vaping Use  . Vaping Use: Never used  Substance and Sexual Activity  . Alcohol use: Not Currently    Alcohol/week: 0.0 standard drinks    Comment: occasional  . Drug use: No  . Sexual activity: Yes    Partners: Female  Other Topics Concern  . Not on file  Social History Narrative   Seperated.   Does charity rides for needy.   Likes to drive his motorcycle; but he has sold his but hoping to getting another one.   Social Determinants of Health   Financial Resource Strain: Low Risk   . Difficulty of Paying Living Expenses: Not hard at all  Food Insecurity: No Food Insecurity  . Worried About Charity fundraiser in the Last Year: Never true  . Ran Out of Food in the Last Year: Never true  Transportation Needs: No Transportation Needs  . Lack of Transportation (Medical): No  . Lack of Transportation (Non-Medical): No  Physical Activity: Insufficiently Active  . Days of Exercise per Week: 4 days  . Minutes of Exercise per Session: 30 min  Stress: No Stress Concern Present  . Feeling of Stress : Not at all  Social Connections: Moderately Integrated  . Frequency of Communication with Friends and Family: More than three times a week  . Frequency of Social Gatherings with Friends and Family: Never  . Attends Religious  Services: More than 4 times per year  . Active Member of Clubs or Organizations: Yes  . Attends Archivist Meetings: More than 4 times per year  . Marital Status: Separated    Activities of Daily Living In your present state of health, do you have any difficulty performing the following activities: 06/08/2020  Hearing? N  Vision? N  Difficulty concentrating or making decisions? N  Walking or climbing stairs? N  Dressing or bathing? N  Doing errands, shopping? N  Preparing Food and eating ? N  Using the Toilet? N  In the past six months, have you accidently leaked urine? N  Do you have problems with loss of bowel control? N  Managing  your Medications? N  Managing your Finances? N  Housekeeping or managing your Housekeeping? N  Some recent data might be hidden    Patient Education/Literacy How often do you need to have someone help you when you read instructions, pamphlets, or other written materials from your doctor or pharmacy?: 1 - Never What is the last grade level you completed in school?: 12th  Exercise Current Exercise Habits: Home exercise routine, Type of exercise: walking, Time (Minutes): 30, Frequency (Times/Week): 4, Weekly Exercise (Minutes/Week): 120, Intensity: Mild, Exercise limited by: Other - see comments  Diet Patient reports consuming 2 meals a day and 3 snack(s) a day Patient reports that his primary diet is: Regular Patient reports that she does have regular access to food.   Depression Screen PHQ 2/9 Scores 06/08/2020 10/01/2019 02/04/2019 10/24/2018 09/17/2018 06/18/2018 01/22/2018  PHQ - 2 Score 2 5 0 2 3 0 1  PHQ- 9 Score 5 17 - 9 7 2 1   Exception Documentation - - - - - - -   Provider notified and patient scheduled for an office visit.   Fall Risk Fall Risk  06/08/2020 02/04/2019 01/22/2018  Falls in the past year? 1 0 No  Number falls in past yr: 1 0 -  Injury with Fall? 1 0 -  Risk for fall due to : Impaired balance/gait - -  Follow up Falls  evaluation completed;Education provided;Falls prevention discussed Falls prevention discussed -     Objective:   BP (!) 144/78 (BP Location: Right Arm, Patient Position: Sitting, Cuff Size: Normal)   Pulse 89   Temp 97.8 F (36.6 C) (Oral)   Resp 18   Ht 5' 10.5" (1.791 m)   Wt 225 lb (102.1 kg)   SpO2 96%   BMI 31.83 kg/m   Last Weight  Most recent update: 06/08/2020  9:05 AM   Weight  102.1 kg (225 lb)            Body mass index is 31.83 kg/m.  Hearing/Vision  . Raymond Hardin did not have difficulty with hearing/understanding during the face-to-face interview . Raymond Hardin did not have difficulty with his vision during the face-to-face interview . Reports that he has had a formal eye exam by an eye care professional within the past year . Reports that he has not had a formal hearing evaluation within the past year  Cognitive Function: 6CIT Screen 06/08/2020 02/04/2019 01/22/2018  What Year? 0 points 0 points 0 points  What month? 0 points 0 points 0 points  What time? 0 points 0 points 0 points  Count back from 20 0 points 0 points 0 points  Months in reverse 2 points 0 points 2 points  Repeat phrase 0 points 0 points 0 points  Total Score 2 0 2    Normal Cognitive Function Screening: Yes (Normal:0-7, Significant for Dysfunction: >8)  Immunization & Health Maintenance Record Immunization History  Administered Date(s) Administered  . Fluad Quad(high Dose 65+) 01/25/2019  . Influenza,inj,Quad PF,6+ Mos 01/23/2013, 02/23/2015, 03/01/2016, 01/31/2017, 01/05/2018  . Influenza-Unspecified 05/15/2020  . Janssen (J&J) SARS-COV-2 Vaccination 09/01/2019, 05/29/2020  . Pneumococcal Conjugate-13 08/13/2014  . Pneumococcal Polysaccharide-23 07/01/2011  . Tdap 01/05/2018    Health Maintenance  Topic Date Due  . TETANUS/TDAP  01/06/2028  . INFLUENZA VACCINE  Completed  . COVID-19 Vaccine  Completed  . Hepatitis C Screening  Completed  . PNA vac Low Risk Adult  Completed        Assessment  This is a routine wellness examination  for Raymond Hardin.  Health Maintenance: Due or Overdue There are no preventive care reminders to display for this patient.  Raymond Hardin does not need a referral for Community Assistance: Care Management:   no Social Work:    no Prescription Assistance:  no Nutrition/Diabetes Education:  no   Plan:  Personalized Goals Goals Addressed              This Visit's Progress   .  Patient Stated (pt-stated)        06/08/2020 AWV Goal: Tobacco Cessation  Smoking cessation instruction/counseling given:  counseled patient on the dangers of tobacco use, advised patient to stop smoking, and reviewed strategies to maximize success   Patient will verbalize understanding of the health risks associated with smoking/tobacco use  Lung cancer or lung disease, such as COPD  Heart disease.  Stroke.  Heart attack  Infertility  Osteoporosis and bone fractures. . Patient will create a plan to quit smoking/using tobacco  Pick a date to quit.   Write down the reasons why you are quitting and put it where you will see it often.  Identify the people, places, things, and activities that make you want to smoke (triggers) and avoid them. Make sure to take these actions: ? Throw away all cigarettes at home, at work, and in your car. ? Throw away smoking accessories, such as Scientist, research (medical). ? Clean your car and make sure to empty the ashtray. ? Clean your home, including curtains and carpets.  Tell your family, friends, and coworkers that you are quitting. Support from your loved ones can make quitting easier.  Talk with your health care provider about your options for quitting smoking.  Find out what treatment options are covered by your health insurance. . Patient will be able to demonstrate knowledge of tobacco cessation strategies that may maximize success  Quitting "cold [place]" is more successful than gradually  quitting.  Attending in-person counseling to help you build problem-solving skills.   Finding resources and support systems that can help you to quit smoking and remain smoke-free after you quit. These resources are most helpful when you use them often. They can include: ? Online chats with a Social worker. ? Telephone quitlines. ? Careers information officer. ? Support groups or group counseling. ? Text messaging programs. ? Mobile phone applications.  Taking medicines to help you quit smoking: ? Nicotine patches, gum, or lozenges. ? Nicotine inhalers or sprays. ? Non-nicotine medicine that is taken by mouth. . Patient will note get discouraged if the process is difficult . Over the next year, patient will stop smoking or using other forms of tobacco  Smoking cessation instruction/counseling given:  counseled patient on the dangers of tobacco use, advised patient to stop smoking, and reviewed strategies to maximize success        Personalized Health Maintenance & Screening Recommendations  Smoking cessation counseling  Shingrix  Lung Cancer Screening Recommended: yes (Low Dose CT Chest recommended if Age 15-80 years, 30 pack-year currently smoking OR have quit w/in past 15 years) Hepatitis C Screening recommended: no HIV Screening recommended: yes  Advanced Directives: Written information was not given per the patient's request.  Referrals & Orders No orders of the defined types were placed in this encounter.   Follow-up Plan . Follow-up with Raymond Stade, PA-C as planned . Schedule your appointment with Raymond Hardin to discuss your current depression treatment and your back pain. . Schedule your appointment at the pharmacy for your shingrix vaccine.  I have personally reviewed and noted the following in the patient's chart:   . Medical and social history . Use of alcohol, tobacco or illicit drugs  . Current medications and supplements . Functional ability and  status . Nutritional status . Physical activity . Advanced directives . List of other physicians . Hospitalizations, surgeries, and ER visits in previous 12 months . Vitals . Screenings to include cognitive, depression, and falls . Referrals and appointments  In addition, I have reviewed and discussed with patient certain preventive protocols, quality metrics, and best practice recommendations. A written personalized care plan for preventive services as well as general preventive health recommendations were provided to patient.     Tinnie Gens, RN  06/08/2020

## 2020-06-08 NOTE — Progress Notes (Signed)
Subjective:    Patient ID: Raymond Hardin, male    DOB: Jan 13, 1941, 80 y.o.   MRN: [id number]  HPI  Patient is a 80 year old male with lumbar degenerative disc disease and history of low back pain without sciatica who presents to the clinic for follow-up after a fall a few weeks ago.  He also would like to discuss his increase in anxiety.  Patient was just seen for his Medicare wellness today and also wanted to speak with me about some issues.  Patient had a fall few weeks ago while trying to stand on a stepladder to change some light bulbs and he went to urgent care at the [place].  X-rays were done and revealed no fractures.  He continues to have low back pain.  He denies any radiation into the legs.  He denies any numbness or tingling.  He denies any bowel or bladder dysfunction.  He has not had any saddle anesthesia or leg weakness.  Tylenol is not helping the pain.  He also would like an increase in Xanax for his anxiety. buspar does help too but xanax helps better.   .. Active Ambulatory Problems    Diagnosis Date Noted  . Erectile dysfunction 11/04/2011  . Tobacco abuse 12/12/2012  . Depression 12/12/2012  . Primary osteoarthritis of right knee 04/24/2013  . Vitamin D deficiency 08/13/2013  . Pre-diabetes 08/16/2013  . Status post left knee replacement 08/13/2014  . Anxiety state 08/13/2014  . Essential hypertension, benign 08/13/2014  . Hyperlipidemia 02/24/2015  . Hypertriglyceridemia 02/24/2015  . CKD (chronic kidney disease), stage III ([place]) 12/07/2015  . S/P total knee replacement, right 01/31/2017  . Bilateral cataracts 01/05/2018  . Posterior vitreous detachment of left eye 04/09/2018  . 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 S/P bilateral cataract extraction 05/25/2018  . Recurrent major depressive disorder, in partial remission ([place]) 06/19/2018  . Skipped heart beats 09/17/2018  . Strong pulse 09/17/2018  . Palpitation 09/19/2018  . Acute bilateral low back pain without  sciatica 01/28/2019  . PAF (paroxysmal atrial fibrillation) ([place]) 05/13/2019  . Low blood potassium 05/13/2019  . Acute encephalopathy 05/16/2019  . Pneumonia of both lungs due to infectious organism 05/16/2019  . COPD (chronic obstructive pulmonary disease) with chronic bronchitis ([place]) 05/16/2019  . Hypotension 05/29/2019  . History of COVID-19 06/11/2019  . Mucopurulent chronic bronchitis ([place]) 06/11/2019  . SOB (shortness of breath) on exertion 10/02/2019  . Cough 11/04/2019   Resolved Ambulatory Problems    Diagnosis Date Noted  . Hypertension, essential, benign 11/04/2011   Past Medical History:  Diagnosis Date  . COPD (chronic obstructive pulmonary disease) ([place])   . COVID-19 03/2019  . Diabetes mellitus without complication ([place])   . Hypertension   . Insomnia 2005  . Osteoarthritis 2000  . Paroxysmal A-fib ([place])   . Pneumonia     Review of Systems  All other systems reviewed and are negative.      Objective:   Physical Exam Vitals reviewed.  Constitutional:      Appearance: Normal appearance. He is obese.  HENT:     Head: Normocephalic.  Cardiovascular:     Rate and Rhythm: Normal rate and regular rhythm.     Pulses: Normal pulses.     Heart sounds: Normal heart sounds.  Pulmonary:     Effort: Pulmonary effort is normal.     Comments: Course breath sounds and chronic productive cough.  Musculoskeletal:     Right lower leg: No edema.  Left lower leg: No edema.     Comments: Pain with forward flexion at waist.  No tenderness to direct palpation of lumbar spine.  Tenderness to paraspinal muscles of lumbar spine.  Negative SLR, bilaterally.   Skin:    Comments: No bruising.   Neurological:     General: No focal deficit present.     Mental Status: He is alert and oriented to person, place, and time.  Psychiatric:        Mood and Affect: Mood normal.        Behavior: Behavior normal.    .. Depression screen [facility] 2/9 06/08/2020 10/01/2019 02/04/2019  10/24/2018 09/17/2018  Decreased Interest 0 2 0 2 2  Down, Depressed, Hopeless 2 3 0 0 1  PHQ - 2 Score 2 5 0 2 3  Altered sleeping 0 2 - 1 0  Tired, decreased energy 0 2 - 1 1  Change in appetite 0 2 - 2 0  Feeling bad or failure about yourself  2 0 - 1 2  Trouble concentrating 0 2 - 1 0  Moving slowly or fidgety/restless 0 2 - 1 1  Suicidal thoughts 1 2 - 0 0  PHQ-9 Score 5 17 - 9 7  Difficult doing work/chores - Somewhat difficult - Somewhat difficult Somewhat difficult  Some recent data might be hidden   .Marland Kitchen GAD 7 : Generalized Anxiety Score 10/01/2019 10/24/2018 09/17/2018 06/18/2018  Nervous, Anxious, on Edge 0 1 1 2   Control/stop worrying 2 2 2  0  Worry too much - different things 1 1 3 3   Trouble relaxing 2 0 0 0  Restless 2 1 0 0  Easily annoyed or irritable 2 1 1  0  Afraid - awful might happen 0 1 0 0  Total GAD 7 Score 9 7 7 5   Anxiety Difficulty Somewhat difficult Somewhat difficult Somewhat difficult -           Assessment & Plan:  Marland KitchenMarland KitchenVuthy was seen today for depression and back pain.  Diagnoses and all orders for this visit:  Fall, subsequent encounter -     traMADol (ULTRAM) 50 MG tablet; Take 1 tablet (50 mg total) by mouth every 8 (eight) hours as needed for up to 5 days.  Mixed hyperlipidemia -     Lipid Panel w/reflex Direct LDL  Medication management -     COMPLETE METABOLIC PANEL WITH GFR  Pre-diabetes -     Hemoglobin A1c  Acute bilateral low back pain without sciatica -     predniSONE (DELTASONE) 50 MG tablet; Take one tablet for 5 days. -     traMADol (ULTRAM) 50 MG tablet; Take 1 tablet (50 mg total) by mouth every 8 (eight) hours as needed for up to 5 days. -     ketorolac (TORADOL) injection 60 mg  Essential hypertension, benign  PAF (paroxysmal atrial fibrillation) (HCC)  COPD (chronic obstructive pulmonary disease) with chronic bronchitis (HCC)  Stage 3a chronic kidney disease (HCC)  Recurrent major depressive disorder, in partial  remission (HCC) -     vortioxetine HBr (TRINTELLIX) 20 MG TABS tablet; Take 1 tablet (20 mg total) by mouth daily.  Anxiety state -     busPIRone (BUSPAR) 15 MG tablet; Take 1 tablet (15 mg total) by mouth 3 (three) times daily.   ..PDMP reviewed during this encounter.  Per patient no fractures when evaluated at [facility]. No red flags today. toradol given in office. Prednisone burst given on PPI. No muscle relaxer due  to fall risk. Last kidney function was good. Hx of some CKD. Will check CMP before suggesting NSAIDs. Tramadol for break through pain. Discussed conservative care. Pt cannot get to PT appts right now. Home exercises given.   PHQ/GAD not to goal. Increased trintellix. Increased buspar. Keep xanax the same due to controlled substance and fall risk. Do not take with tramadol.   Needs fasting labs.   Follow up in 4 weeks.

## 2020-06-09 ENCOUNTER — Encounter: Payer: Self-pay | Admitting: Physician Assistant

## 2020-06-09 LAB — LIPID PANEL W/REFLEX DIRECT LDL
Cholesterol: 115 mg/dL (ref <200)
HDL: 49 mg/dL (ref >=40)
LDL Cholesterol (Calc): 44 mg/dL (calc)
Non-HDL Cholesterol (Calc): 66 mg/dL (calc) (ref <130)
Total CHOL/HDL Ratio: 2.3 (calc) (ref <5.0)
Triglycerides: 136 mg/dL (ref <150)

## 2020-06-09 LAB — COMPLETE METABOLIC PANEL WITH GFR
AG Ratio: 1.4 (calc) (ref 1.0–2.5)
ALT: 14 U/L (ref 9–46)
AST: 16 U/L (ref 10–35)
Albumin: 4.4 g/dL (ref 3.6–5.1)
Alkaline phosphatase (APISO): 49 U/L (ref 35–144)
BUN/Creatinine Ratio: 16 (calc) (ref 6–22)
BUN: 22 mg/dL (ref 7–25)
CO2: 25 mmol/L (ref 20–32)
Calcium: 9.4 mg/dL (ref 8.6–10.3)
Chloride: 105 mmol/L (ref 98–110)
Creat: 1.37 mg/dL — ABNORMAL HIGH (ref 0.70–1.18)
GFR, Est African American: 57 mL/min/{1.73_m2} — ABNORMAL LOW (ref >=60)
GFR, Est Non African American: 49 mL/min/{1.73_m2} — ABNORMAL LOW (ref >=60)
Globulin: 3.2 g/dL (calc) (ref 1.9–3.7)
Glucose, Bld: 103 mg/dL (ref 65–139)
Potassium: 4.7 mmol/L (ref 3.5–5.3)
Sodium: 142 mmol/L (ref 135–146)
Total Bilirubin: 0.5 mg/dL (ref 0.2–1.2)
Total Protein: 7.6 g/dL (ref 6.1–8.1)

## 2020-06-09 LAB — HEMOGLOBIN A1C
Hgb A1c MFr Bld: 6.6 % of total Hgb — ABNORMAL HIGH (ref <5.7)
Mean Plasma Glucose: 143 mg/dL
eAG (mmol/L): 7.9 mmol/L

## 2020-06-10 ENCOUNTER — Other Ambulatory Visit: Payer: Self-pay | Admitting: Neurology

## 2020-06-10 MED ORDER — METFORMIN HCL 1000 MG PO TABS: 1000.0000 mg | ORAL_TABLET | Freq: Two times a day (BID) | ORAL | 1 refills | Status: DC

## 2020-06-10 NOTE — Progress Notes (Signed)
Aerik,   Cholesterol is perfect.  A1C diabetes is numbers are up some. I want to increase metformin to 1000mg  twice a day if you think you can tolerate it?? Kidney function is down some. Don't take any oral motrin/aleve/goodies powder. Recheck in 1 month.

## 2020-06-15 ENCOUNTER — Ambulatory Visit: Payer: Medicare HMO | Admitting: Physician Assistant

## 2020-07-06 ENCOUNTER — Ambulatory Visit (INDEPENDENT_AMBULATORY_CARE_PROVIDER_SITE_OTHER): Payer: Medicare HMO | Admitting: Physician Assistant

## 2020-07-06 ENCOUNTER — Encounter: Payer: Self-pay | Admitting: Physician Assistant

## 2020-07-06 ENCOUNTER — Other Ambulatory Visit: Payer: Self-pay

## 2020-07-06 VITALS — BP 166/97 | HR 95 | Ht 70.5 in | Wt 227.0 lb

## 2020-07-06 DIAGNOSIS — I10 Essential (primary) hypertension: Secondary | ICD-10-CM

## 2020-07-06 DIAGNOSIS — J411 Mucopurulent chronic bronchitis: Secondary | ICD-10-CM

## 2020-07-06 DIAGNOSIS — I480 Paroxysmal atrial fibrillation: Secondary | ICD-10-CM

## 2020-07-06 DIAGNOSIS — F3341 Major depressive disorder, recurrent, in partial remission: Secondary | ICD-10-CM

## 2020-07-06 DIAGNOSIS — E782 Mixed hyperlipidemia: Secondary | ICD-10-CM

## 2020-07-06 DIAGNOSIS — F411 Generalized anxiety disorder: Secondary | ICD-10-CM | POA: Diagnosis not present

## 2020-07-06 MED ORDER — ATORVASTATIN CALCIUM 20 MG PO TABS
20.0000 mg | ORAL_TABLET | Freq: Every day | ORAL | 1 refills | Status: DC
Start: 2020-07-06 — End: 2021-07-19

## 2020-07-06 MED ORDER — VALSARTAN 80 MG PO TABS: 80.0000 mg | ORAL_TABLET | Freq: Every day | ORAL | 0 refills | Status: DC

## 2020-07-06 MED ORDER — ARIPIPRAZOLE 2 MG PO TABS: 2.0000 mg | ORAL_TABLET | Freq: Every day | ORAL | 1 refills | Status: DC

## 2020-07-06 MED ORDER — TRELEGY ELLIPTA 100-62.5-25 MCG/INH IN AEPB: 1.0000 | INHALATION_SPRAY | Freq: Every day | RESPIRATORY_TRACT | 5 refills | Status: DC

## 2020-07-06 MED ORDER — ALPRAZOLAM 0.5 MG PO TABS: ORAL_TABLET | ORAL | 5 refills | Status: DC

## 2020-07-06 MED ORDER — VORTIOXETINE HBR 20 MG PO TABS: 20.0000 mg | ORAL_TABLET | Freq: Every day | ORAL | 1 refills | Status: DC

## 2020-07-06 MED ORDER — BUSPIRONE HCL 15 MG PO TABS: 15.0000 mg | ORAL_TABLET | Freq: Three times a day (TID) | ORAL | 0 refills | Status: DC

## 2020-07-06 MED ORDER — METOPROLOL SUCCINATE ER 100 MG PO TB24: 100.0000 mg | ORAL_TABLET | Freq: Every day | ORAL | 1 refills | Status: DC

## 2020-07-06 NOTE — Progress Notes (Signed)
Subjective:    Patient ID: Raymond Hardin, male    DOB: Aug 14, 1940, 80 y.o.   MRN: [id number]  HPI  Pt is a 80 yo obese male with HTN, PAF, MDD, GAD, HLD, GERD who presents to the clinic for BP follow up.   Pt had elevated BP reading at last visit. BP medications changed. Denies any CP, palpitations, HA, vision changes. Taking medication daily. Not checking BP at home.   Pt depressed mood has worsened. He just feels like he has no purpose. "I'm tired of people worrying about me". He continues to feel anxious all day.he has considered a Social worker.   .. Active Ambulatory Problems    Diagnosis Date Noted  . Erectile dysfunction 11/04/2011  . Tobacco abuse 12/12/2012  . Depression 12/12/2012  . Primary osteoarthritis of right knee 04/24/2013  . Vitamin D deficiency 08/13/2013  . Pre-diabetes 08/16/2013  . Status post left knee replacement 08/13/2014  . Anxiety state 08/13/2014  . Essential hypertension, benign 08/13/2014  . Hyperlipidemia 02/24/2015  . Hypertriglyceridemia 02/24/2015  . CKD (chronic kidney disease), stage III ([place]) 12/07/2015  . S/P total knee replacement, right 01/31/2017  . Bilateral cataracts 01/05/2018  . Posterior vitreous detachment of left eye 04/09/2018  . 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 S/P bilateral cataract extraction 05/25/2018  . Recurrent major depressive disorder, in partial remission ([place]) 06/19/2018  . Skipped heart beats 09/17/2018  . Strong pulse 09/17/2018  . Palpitation 09/19/2018  . Acute bilateral low back pain without sciatica 01/28/2019  . PAF (paroxysmal atrial fibrillation) ([place]) 05/13/2019  . Low blood potassium 05/13/2019  . Acute encephalopathy 05/16/2019  . Pneumonia of both lungs due to infectious organism 05/16/2019  . COPD (chronic obstructive pulmonary disease) with chronic bronchitis ([place]) 05/16/2019  . Hypotension 05/29/2019  . History of COVID-19 06/11/2019  . Mucopurulent chronic bronchitis ([place]) 06/11/2019  . SOB  (shortness of breath) on exertion 10/02/2019  . Cough 11/04/2019   Resolved Ambulatory Problems    Diagnosis Date Noted  . Hypertension, essential, benign 11/04/2011   Past Medical History:  Diagnosis Date  . COPD (chronic obstructive pulmonary disease) ([place])   . COVID-19 03/2019  . Diabetes mellitus without complication ([place])   . Hypertension   . Insomnia 2005  . Osteoarthritis 2000  . Paroxysmal A-fib ([place])   . Pneumonia     Review of Systems See HPI.     Objective:   Physical Exam Vitals reviewed.  Constitutional:      Appearance: Normal appearance. He is obese.  HENT:     Head: Normocephalic.  Neck:     Vascular: No carotid bruit.  Cardiovascular:     Rate and Rhythm: Normal rate and regular rhythm.     Pulses: Normal pulses.     Heart sounds: Murmur heard.    Pulmonary:     Effort: Pulmonary effort is normal.     Breath sounds: Normal breath sounds.     Comments: Coarse breath sounds.  Musculoskeletal:     Right lower leg: No edema.     Left lower leg: No edema.  Neurological:     General: No focal deficit present.     Mental Status: He is alert and oriented to person, place, and time.  Psychiatric:     Comments: Depressed mood.           .. Depression screen [facility] 2/9 07/06/2020 06/08/2020 10/01/2019 02/04/2019 10/24/2018  Decreased Interest 0 0 2 0 2  Down, Depressed, Hopeless 0 2  3 0 0  PHQ - 2 Score 0 2 5 0 2  Altered sleeping 0 0 2 - 1  Tired, decreased energy 1 0 2 - 1  Change in appetite 0 0 2 - 2  Feeling bad or failure about yourself  2 2 0 - 1  Trouble concentrating 2 0 2 - 1  Moving slowly or fidgety/restless 0 0 2 - 1  Suicidal thoughts 1 1 2  - 0  PHQ-9 Score 6 5 17  - 9  Difficult doing work/chores Not difficult at all - Somewhat difficult - Somewhat difficult  Some recent data might be hidden   .Marland Kitchen GAD 7 : Generalized Anxiety Score 07/06/2020 10/01/2019 10/24/2018 09/17/2018  Nervous, Anxious, on Edge 0 0 1 1  Control/stop worrying 2 2 2  2   Worry too much - different things 2 1 1 3   Trouble relaxing 0 2 0 0  Restless 0 2 1 0  Easily annoyed or irritable 0 2 1 1   Afraid - awful might happen 0 0 1 0  Total GAD 7 Score 4 9 7 7   Anxiety Difficulty Not difficult at all Somewhat difficult Somewhat difficult Somewhat difficult     Assessment & Plan:  Marland KitchenMarland KitchenJonah was seen today for follow-up.  Diagnoses and all orders for this visit:  Essential hypertension, benign -     valsartan (DIOVAN) 80 MG tablet; Take 1 tablet (80 mg total) by mouth daily.  Recurrent major depressive disorder, in partial remission (HCC) -     vortioxetine HBr (TRINTELLIX) 20 MG TABS tablet; Take 1 tablet (20 mg total) by mouth daily. -     ARIPiprazole (ABILIFY) 2 MG tablet; Take 1 tablet (2 mg total) by mouth daily.  PAF (paroxysmal atrial fibrillation) (HCC) -     metoprolol succinate (TOPROL-XL) 100 MG 24 hr tablet; Take 1 tablet (100 mg total) by mouth daily.  Mucopurulent chronic bronchitis ([place]) -     Fluticasone-Umeclidin-Vilant (TRELEGY ELLIPTA) 100-62.5-25 MCG/INH AEPB; Inhale 1 puff into the lungs daily.  Mixed hyperlipidemia -     atorvastatin (LIPITOR) 20 MG tablet; Take 1 tablet (20 mg total) by mouth daily.  Anxiety state -     busPIRone (BUSPAR) 15 MG tablet; Take 1 tablet (15 mg total) by mouth 3 (three) times daily. -     ALPRAZolam (XANAX) 0.5 MG tablet; TAKE 1 TABLET(0.5 MG) BY MOUTH AT BEDTIME AS NEEDED FOR ANXIETY   BP not to goal. Increased diovan to 80mg . Recheck in 2-4 weeks.  Continue metoprolol.  For mood. Consider counseling through [place]. Added abilify. Continue trintellix, buspar and xanax no more than once a day.

## 2020-07-06 NOTE — Patient Instructions (Signed)
Add abilify daily  trintellix daily buspar three times a day Xanax no more than once a day.

## 2020-07-08 ENCOUNTER — Encounter: Payer: Self-pay | Admitting: Physician Assistant

## 2020-07-09 DIAGNOSIS — R58 Hemorrhage, not elsewhere classified: Secondary | ICD-10-CM | POA: Diagnosis not present

## 2020-07-09 DIAGNOSIS — I10 Essential (primary) hypertension: Secondary | ICD-10-CM | POA: Diagnosis not present

## 2020-07-09 DIAGNOSIS — R040 Epistaxis: Secondary | ICD-10-CM | POA: Diagnosis not present

## 2020-08-17 ENCOUNTER — Ambulatory Visit (INDEPENDENT_AMBULATORY_CARE_PROVIDER_SITE_OTHER): Payer: Medicare HMO | Admitting: Physician Assistant

## 2020-08-17 ENCOUNTER — Encounter: Payer: Self-pay | Admitting: Physician Assistant

## 2020-08-17 ENCOUNTER — Other Ambulatory Visit: Payer: Self-pay

## 2020-08-17 VITALS — BP 137/50 | HR 79 | Ht 70.5 in | Wt 225.0 lb

## 2020-08-17 DIAGNOSIS — N183 Chronic kidney disease, stage 3 unspecified: Secondary | ICD-10-CM | POA: Diagnosis not present

## 2020-08-17 DIAGNOSIS — I480 Paroxysmal atrial fibrillation: Secondary | ICD-10-CM

## 2020-08-17 DIAGNOSIS — E119 Type 2 diabetes mellitus without complications: Secondary | ICD-10-CM

## 2020-08-17 DIAGNOSIS — E1122 Type 2 diabetes mellitus with diabetic chronic kidney disease: Secondary | ICD-10-CM | POA: Diagnosis not present

## 2020-08-17 DIAGNOSIS — J449 Chronic obstructive pulmonary disease, unspecified: Secondary | ICD-10-CM | POA: Diagnosis not present

## 2020-08-17 DIAGNOSIS — F3341 Major depressive disorder, recurrent, in partial remission: Secondary | ICD-10-CM

## 2020-08-17 DIAGNOSIS — I10 Essential (primary) hypertension: Secondary | ICD-10-CM | POA: Diagnosis not present

## 2020-08-17 MED ORDER — ARIPIPRAZOLE 2 MG PO TABS: 2.0000 mg | ORAL_TABLET | Freq: Every day | ORAL | 1 refills | Status: DC

## 2020-08-17 NOTE — Patient Instructions (Signed)
Pt has T2DM with last A1C of 6.6 and on metformin.  Pt has PAF controlled with metoprolol and on chronic anticoagulation.

## 2020-08-17 NOTE — Progress Notes (Signed)
Subjective:    Patient ID: Raymond Hardin, male    DOB: 1940-05-12, 80 y.o.   MRN: [id number]  HPI  Patient is a 80 year old obese male with hypertension, MDD, hyperlipidemia, type 2 diabetes, PAF, COPD  who presents to the clinic for follow up.   Patient brings in some paperwork from the [place] to look for any of these conditions that could have been product of something he was exposed to in war.   PAF- in rhythm. On chronic anticoagulation.  On beta-blocker.  Hypertension-not checking his blood pressure at home.  Denies any chest pain, palpitations, headache, vision changes, dizziness.  Type 2 diabetes taking Metformin with no problems or concerns.  Trying to watch diet and stay active.  .. Active Ambulatory Problems    Diagnosis Date Noted  . Erectile dysfunction 11/04/2011  . Tobacco abuse 12/12/2012  . Depression 12/12/2012  . Primary osteoarthritis of right knee 04/24/2013  . Vitamin D deficiency 08/13/2013  . Pre-diabetes 08/16/2013  . Status post left knee replacement 08/13/2014  . Anxiety state 08/13/2014  . Essential hypertension, benign 08/13/2014  . Hyperlipidemia 02/24/2015  . Hypertriglyceridemia 02/24/2015  . CKD (chronic kidney disease), stage III ([place]) 12/07/2015  . S/P total knee replacement, right 01/31/2017  . Bilateral cataracts 01/05/2018  . Posterior vitreous detachment of left eye 04/09/2018  . 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 S/P bilateral cataract extraction 05/25/2018  . Recurrent major depressive disorder, in partial remission ([place]) 06/19/2018  . Skipped heart beats 09/17/2018  . Strong pulse 09/17/2018  . Palpitation 09/19/2018  . Acute bilateral low back pain without sciatica 01/28/2019  . PAF (paroxysmal atrial fibrillation) ([place]) 05/13/2019  . Low blood potassium 05/13/2019  . Acute encephalopathy 05/16/2019  . COPD (chronic obstructive pulmonary disease) with chronic bronchitis ([place]) 05/16/2019  . Hypotension 05/29/2019  . History of  COVID-19 06/11/2019  . Mucopurulent chronic bronchitis ([place]) 06/11/2019  . SOB (shortness of breath) on exertion 10/02/2019  . Cough 11/04/2019   Resolved Ambulatory Problems    Diagnosis Date Noted  . Hypertension, essential, benign 11/04/2011  . Pneumonia of both lungs due to infectious organism 05/16/2019   Past Medical History:  Diagnosis Date  . COPD (chronic obstructive pulmonary disease) ([place])   . COVID-19 03/2019  . Diabetes mellitus without complication ([place])   . Hypertension   . Insomnia 2005  . Osteoarthritis 2000  . Paroxysmal A-fib ([place])   . Pneumonia        Review of Systems  All other systems reviewed and are negative.      Objective:   Physical Exam HENT:     Head: Normocephalic.     Mouth/Throat:     Mouth: Mucous membranes are moist.  Cardiovascular:     Rate and Rhythm: Normal rate and regular rhythm.  Pulmonary:     Effort: Pulmonary effort is normal.     Breath sounds: Normal breath sounds.  Musculoskeletal:     Right lower leg: No edema.     Left lower leg: No edema.  Neurological:     General: No focal deficit present.     Mental Status: He is oriented to person, place, and time.  Psychiatric:        Mood and Affect: Mood normal.        Behavior: Behavior normal.    .. Depression screen [facility] 2/9 07/06/2020 06/08/2020 10/01/2019 02/04/2019 10/24/2018  Decreased Interest 0 0 2 0 2  Down, Depressed, Hopeless 0 2 3 0 0  PHQ - 2 Score 0 2 5 0 2  Altered sleeping 0 0 2 - 1  Tired, decreased energy 1 0 2 - 1  Change in appetite 0 0 2 - 2  Feeling bad or failure about yourself  2 2 0 - 1  Trouble concentrating 2 0 2 - 1  Moving slowly or fidgety/restless 0 0 2 - 1  Suicidal thoughts 1 1 2  - 0  PHQ-9 Score 6 5 17  - 9  Difficult doing work/chores Not difficult at all - Somewhat difficult - Somewhat difficult  Some recent data might be hidden   .Marland Kitchen GAD 7 : Generalized Anxiety Score 07/06/2020 10/01/2019 10/24/2018 09/17/2018  Nervous, Anxious, on  Edge 0 0 1 1  Control/stop worrying 2 2 2 2   Worry too much - different things 2 1 1 3   Trouble relaxing 0 2 0 0  Restless 0 2 1 0  Easily annoyed or irritable 0 2 1 1   Afraid - awful might happen 0 0 1 0  Total GAD 7 Score 4 9 7 7   Anxiety Difficulty Not difficult at all Somewhat difficult Somewhat difficult Somewhat difficult           Assessment & Plan:  Marland KitchenMarland KitchenLauri was seen today for hypertension.  Diagnoses and all orders for this visit:  Type 2 diabetes mellitus without complication, without long-term current use of insulin (HCC)  Essential hypertension, benign -     COMPLETE METABOLIC PANEL WITH GFR  PAF (paroxysmal atrial fibrillation) (HCC)  COPD (chronic obstructive pulmonary disease) with chronic bronchitis (HCC)  Recurrent major depressive disorder, in partial remission (HCC) -     ARIPiprazole (ABILIFY) 2 MG tablet; Take 1 tablet (2 mg total) by mouth daily.  Stage 3 chronic kidney disease, unspecified whether stage 3a or 3b CKD (HCC)   BP to goal. Continue same medications.  Last CMP elevated serum creatinine. Recheck today.   COPD-stable. Continue on trelegy. Not willing to stop smoking.   MDD/anxiety- on trintellix and abilify and xanax as needed. Doing better.   T2DM- follow up in 1 month for A1C. Too soon to check right now. On statin.   HLD-continue on statin.   PAF- controlled with metoprolol. On chronic anticoagulation.

## 2020-09-14 ENCOUNTER — Ambulatory Visit: Payer: Medicare HMO | Admitting: Physician Assistant

## 2020-09-15 ENCOUNTER — Ambulatory Visit (INDEPENDENT_AMBULATORY_CARE_PROVIDER_SITE_OTHER): Payer: Medicare HMO | Admitting: Physician Assistant

## 2020-09-15 ENCOUNTER — Encounter: Payer: Self-pay | Admitting: Physician Assistant

## 2020-09-15 ENCOUNTER — Other Ambulatory Visit: Payer: Self-pay

## 2020-09-15 VITALS — BP 156/87 | HR 76 | Temp 98.1°F | Resp 20 | Ht 70.5 in | Wt 226.0 lb

## 2020-09-15 DIAGNOSIS — I480 Paroxysmal atrial fibrillation: Secondary | ICD-10-CM

## 2020-09-15 DIAGNOSIS — E1122 Type 2 diabetes mellitus with diabetic chronic kidney disease: Secondary | ICD-10-CM | POA: Diagnosis not present

## 2020-09-15 DIAGNOSIS — I10 Essential (primary) hypertension: Secondary | ICD-10-CM | POA: Diagnosis not present

## 2020-09-15 DIAGNOSIS — N1831 Chronic kidney disease, stage 3a: Secondary | ICD-10-CM | POA: Insufficient documentation

## 2020-09-15 LAB — POCT GLYCOSYLATED HEMOGLOBIN (HGB A1C): Hemoglobin A1C: 6.2 % — AB (ref 4.0–5.6)

## 2020-09-15 MED ORDER — VALSARTAN 160 MG PO TABS: 160.0000 mg | ORAL_TABLET | Freq: Every day | ORAL | 0 refills | Status: DC

## 2020-09-15 NOTE — Progress Notes (Signed)
Subjective:    Patient ID: Raymond Hardin, male    DOB: [birth year], 80 y.o.   MRN: [id number]  HPI  Pt is a 80 yo male with HTN, PAF, COPD, T2DM, OSA, CKD, MDD, anxiety who presents to the clinic for 3 month follow up.   HTN- checking BP at home. Highest in the evenings in the 150s of 80s. No CP, palpitations, SOB.   Using CPAP. Feels like it is helping him with injury.   Not checking his sugars. Denies any hypoglycemia.  Denies any open sores or wounds.  He is not managing his diet well.  He is walking around his apartment complex but no exercise.  His anxiety is fairly well controlled on Zoloft, BuSpar, Xanax. .. Active Ambulatory Problems    Diagnosis Date Noted  . Erectile dysfunction 11/04/2011  . Tobacco abuse 12/12/2012  . Depression 12/12/2012  . Primary osteoarthritis of right knee 04/24/2013  . Vitamin D deficiency 08/13/2013  . Pre-diabetes 08/16/2013  . Status post left knee replacement 08/13/2014  . Anxiety state 08/13/2014  . Essential hypertension, benign 08/13/2014  . Hyperlipidemia 02/24/2015  . Hypertriglyceridemia 02/24/2015  . CKD (chronic kidney disease), stage III ([place]) 12/07/2015  . S/P total knee replacement, right 01/31/2017  . Bilateral cataracts 01/05/2018  . Posterior vitreous detachment of left eye 04/09/2018  . 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 S/P bilateral cataract extraction 05/25/2018  . Recurrent major depressive disorder, in partial remission ([place]) 06/19/2018  . Skipped heart beats 09/17/2018  . Strong pulse 09/17/2018  . Palpitation 09/19/2018  . Acute bilateral low back pain without sciatica 01/28/2019  . PAF (paroxysmal atrial fibrillation) ([place]) 05/13/2019  . Low blood potassium 05/13/2019  . Acute encephalopathy 05/16/2019  . COPD (chronic obstructive pulmonary disease) with chronic bronchitis ([place]) 05/16/2019  . Hypotension 05/29/2019  . History of COVID-19 06/11/2019  . Mucopurulent chronic bronchitis ([place]) 06/11/2019  . SOB  (shortness of breath) on exertion 10/02/2019  . Cough 11/04/2019  . Type 2 diabetes mellitus with stage 3a chronic kidney disease, without long-term current use of insulin ([place]) 09/15/2020   Resolved Ambulatory Problems    Diagnosis Date Noted  . Hypertension, essential, benign 11/04/2011  . Pneumonia of both lungs due to infectious organism 05/16/2019   Past Medical History:  Diagnosis Date  . COPD (chronic obstructive pulmonary disease) ([place])   . COVID-19 03/2019  . Diabetes mellitus without complication ([place])   . Hypertension   . Insomnia 2005  . Osteoarthritis 2000  . Paroxysmal A-fib ([place])   . Pneumonia     Review of Systems     Objective:   Physical Exam Vitals reviewed.  Constitutional:      Appearance: Normal appearance.  HENT:     Head: Normocephalic.  Cardiovascular:     Rate and Rhythm: Normal rate and regular rhythm.     Pulses: Normal pulses.     Heart sounds: Murmur heard.    Pulmonary:     Effort: Pulmonary effort is normal.     Breath sounds: Normal breath sounds.  Neurological:     General: No focal deficit present.     Mental Status: He is alert and oriented to person, place, and time.  Psychiatric:        Mood and Affect: Mood normal.          .. Lab Results  Component Value Date   HGBA1C 6.2 (A) 09/15/2020    Assessment & Plan:  Marland KitchenMarland KitchenKerrick was seen today for diabetes.  Diagnoses and all orders for this visit:  Type 2 diabetes mellitus with stage 3a chronic kidney disease, without long-term current use of insulin (HCC) -     POCT glycosylated hemoglobin (Hb A1C)  Essential hypertension, benign -     valsartan (DIOVAN) 160 MG tablet; Take 1 tablet (160 mg total) by mouth daily.  PAF (paroxysmal atrial fibrillation) (HCC)   A1C to goal.  Continue on metformin.  On ARB. BP not to goal. Increased to 160mg  today.  On STATIN.  Needs foot and eye exam.  Covid/flu/pneumonia UTD.  Follow up in 3 months.   Pt not interested in  smoking cessation.

## 2020-09-15 NOTE — Progress Notes (Signed)
a1c 6.2

## 2020-09-16 ENCOUNTER — Encounter: Payer: Self-pay | Admitting: Physician Assistant

## 2020-10-13 ENCOUNTER — Other Ambulatory Visit: Payer: Self-pay | Admitting: Physician Assistant

## 2020-10-13 DIAGNOSIS — E782 Mixed hyperlipidemia: Secondary | ICD-10-CM

## 2020-11-08 DIAGNOSIS — M25519 Pain in unspecified shoulder: Secondary | ICD-10-CM | POA: Diagnosis not present

## 2020-11-08 DIAGNOSIS — W19XXXA Unspecified fall, initial encounter: Secondary | ICD-10-CM | POA: Diagnosis not present

## 2020-11-08 DIAGNOSIS — F1510 Other stimulant abuse, uncomplicated: Secondary | ICD-10-CM | POA: Diagnosis not present

## 2020-11-08 DIAGNOSIS — Z96612 Presence of left artificial shoulder joint: Secondary | ICD-10-CM | POA: Diagnosis not present

## 2020-11-08 DIAGNOSIS — F1010 Alcohol abuse, uncomplicated: Secondary | ICD-10-CM | POA: Diagnosis not present

## 2020-11-08 DIAGNOSIS — I129 Hypertensive chronic kidney disease with stage 1 through stage 4 chronic kidney disease, or unspecified chronic kidney disease: Secondary | ICD-10-CM | POA: Diagnosis not present

## 2020-11-08 DIAGNOSIS — I6789 Other cerebrovascular disease: Secondary | ICD-10-CM | POA: Diagnosis not present

## 2020-11-08 DIAGNOSIS — N179 Acute kidney failure, unspecified: Secondary | ICD-10-CM | POA: Diagnosis not present

## 2020-11-08 DIAGNOSIS — Z20822 Contact with and (suspected) exposure to covid-19: Secondary | ICD-10-CM | POA: Diagnosis not present

## 2020-11-08 DIAGNOSIS — Z96653 Presence of artificial knee joint, bilateral: Secondary | ICD-10-CM | POA: Diagnosis not present

## 2020-11-08 DIAGNOSIS — I739 Peripheral vascular disease, unspecified: Secondary | ICD-10-CM | POA: Diagnosis not present

## 2020-11-08 DIAGNOSIS — R52 Pain, unspecified: Secondary | ICD-10-CM | POA: Diagnosis not present

## 2020-11-08 DIAGNOSIS — Z79899 Other long term (current) drug therapy: Secondary | ICD-10-CM | POA: Diagnosis not present

## 2020-11-08 DIAGNOSIS — I480 Paroxysmal atrial fibrillation: Secondary | ICD-10-CM | POA: Diagnosis not present

## 2020-11-08 DIAGNOSIS — G8911 Acute pain due to trauma: Secondary | ICD-10-CM | POA: Diagnosis not present

## 2020-11-08 DIAGNOSIS — F1590 Other stimulant use, unspecified, uncomplicated: Secondary | ICD-10-CM | POA: Diagnosis not present

## 2020-11-08 DIAGNOSIS — M19011 Primary osteoarthritis, right shoulder: Secondary | ICD-10-CM | POA: Diagnosis not present

## 2020-11-08 DIAGNOSIS — T796XXA Traumatic ischemia of muscle, initial encounter: Secondary | ICD-10-CM | POA: Diagnosis not present

## 2020-11-08 DIAGNOSIS — I10 Essential (primary) hypertension: Secondary | ICD-10-CM | POA: Diagnosis not present

## 2020-11-08 DIAGNOSIS — S40011A Contusion of right shoulder, initial encounter: Secondary | ICD-10-CM | POA: Diagnosis not present

## 2020-11-08 DIAGNOSIS — Z87891 Personal history of nicotine dependence: Secondary | ICD-10-CM | POA: Diagnosis not present

## 2020-11-08 DIAGNOSIS — F419 Anxiety disorder, unspecified: Secondary | ICD-10-CM | POA: Diagnosis not present

## 2020-11-08 DIAGNOSIS — G4733 Obstructive sleep apnea (adult) (pediatric): Secondary | ICD-10-CM | POA: Diagnosis not present

## 2020-11-08 DIAGNOSIS — E1122 Type 2 diabetes mellitus with diabetic chronic kidney disease: Secondary | ICD-10-CM | POA: Diagnosis not present

## 2020-11-08 DIAGNOSIS — M25511 Pain in right shoulder: Secondary | ICD-10-CM | POA: Diagnosis not present

## 2020-11-08 DIAGNOSIS — J449 Chronic obstructive pulmonary disease, unspecified: Secondary | ICD-10-CM | POA: Diagnosis not present

## 2020-11-08 DIAGNOSIS — E785 Hyperlipidemia, unspecified: Secondary | ICD-10-CM | POA: Diagnosis not present

## 2020-11-08 DIAGNOSIS — G319 Degenerative disease of nervous system, unspecified: Secondary | ICD-10-CM | POA: Diagnosis not present

## 2020-11-08 DIAGNOSIS — N183 Chronic kidney disease, stage 3 unspecified: Secondary | ICD-10-CM | POA: Diagnosis not present

## 2020-11-08 DIAGNOSIS — M6282 Rhabdomyolysis: Secondary | ICD-10-CM | POA: Diagnosis not present

## 2020-11-08 DIAGNOSIS — R8281 Pyuria: Secondary | ICD-10-CM | POA: Diagnosis not present

## 2020-11-08 DIAGNOSIS — I6523 Occlusion and stenosis of bilateral carotid arteries: Secondary | ICD-10-CM | POA: Diagnosis not present

## 2020-11-08 DIAGNOSIS — M1990 Unspecified osteoarthritis, unspecified site: Secondary | ICD-10-CM | POA: Diagnosis not present

## 2020-11-09 DIAGNOSIS — T796XXA Traumatic ischemia of muscle, initial encounter: Secondary | ICD-10-CM | POA: Diagnosis not present

## 2020-11-09 DIAGNOSIS — F1510 Other stimulant abuse, uncomplicated: Secondary | ICD-10-CM | POA: Diagnosis not present

## 2020-11-09 DIAGNOSIS — I129 Hypertensive chronic kidney disease with stage 1 through stage 4 chronic kidney disease, or unspecified chronic kidney disease: Secondary | ICD-10-CM | POA: Diagnosis not present

## 2020-11-09 DIAGNOSIS — Z20822 Contact with and (suspected) exposure to covid-19: Secondary | ICD-10-CM | POA: Diagnosis not present

## 2020-11-09 DIAGNOSIS — J449 Chronic obstructive pulmonary disease, unspecified: Secondary | ICD-10-CM | POA: Diagnosis not present

## 2020-11-09 DIAGNOSIS — E1122 Type 2 diabetes mellitus with diabetic chronic kidney disease: Secondary | ICD-10-CM | POA: Diagnosis not present

## 2020-11-09 DIAGNOSIS — I480 Paroxysmal atrial fibrillation: Secondary | ICD-10-CM | POA: Diagnosis not present

## 2020-11-09 DIAGNOSIS — N183 Chronic kidney disease, stage 3 unspecified: Secondary | ICD-10-CM | POA: Diagnosis not present

## 2020-11-09 DIAGNOSIS — N179 Acute kidney failure, unspecified: Secondary | ICD-10-CM | POA: Diagnosis not present

## 2020-11-09 DIAGNOSIS — I6789 Other cerebrovascular disease: Secondary | ICD-10-CM | POA: Diagnosis not present

## 2020-11-09 DIAGNOSIS — G4733 Obstructive sleep apnea (adult) (pediatric): Secondary | ICD-10-CM | POA: Diagnosis not present

## 2020-11-09 DIAGNOSIS — F419 Anxiety disorder, unspecified: Secondary | ICD-10-CM | POA: Diagnosis not present

## 2020-11-09 DIAGNOSIS — M6282 Rhabdomyolysis: Secondary | ICD-10-CM | POA: Diagnosis not present

## 2020-11-10 DIAGNOSIS — I129 Hypertensive chronic kidney disease with stage 1 through stage 4 chronic kidney disease, or unspecified chronic kidney disease: Secondary | ICD-10-CM | POA: Diagnosis not present

## 2020-11-10 DIAGNOSIS — N183 Chronic kidney disease, stage 3 unspecified: Secondary | ICD-10-CM | POA: Diagnosis not present

## 2020-11-10 DIAGNOSIS — N179 Acute kidney failure, unspecified: Secondary | ICD-10-CM | POA: Diagnosis not present

## 2020-11-10 DIAGNOSIS — F1510 Other stimulant abuse, uncomplicated: Secondary | ICD-10-CM | POA: Diagnosis not present

## 2020-11-10 DIAGNOSIS — J449 Chronic obstructive pulmonary disease, unspecified: Secondary | ICD-10-CM | POA: Diagnosis not present

## 2020-11-10 DIAGNOSIS — F419 Anxiety disorder, unspecified: Secondary | ICD-10-CM | POA: Diagnosis not present

## 2020-11-10 DIAGNOSIS — E1122 Type 2 diabetes mellitus with diabetic chronic kidney disease: Secondary | ICD-10-CM | POA: Diagnosis not present

## 2020-11-10 DIAGNOSIS — G4733 Obstructive sleep apnea (adult) (pediatric): Secondary | ICD-10-CM | POA: Diagnosis not present

## 2020-11-10 DIAGNOSIS — I480 Paroxysmal atrial fibrillation: Secondary | ICD-10-CM | POA: Diagnosis not present

## 2020-11-10 DIAGNOSIS — M6282 Rhabdomyolysis: Secondary | ICD-10-CM | POA: Diagnosis not present

## 2020-11-10 DIAGNOSIS — T796XXA Traumatic ischemia of muscle, initial encounter: Secondary | ICD-10-CM | POA: Diagnosis not present

## 2020-11-10 DIAGNOSIS — I6789 Other cerebrovascular disease: Secondary | ICD-10-CM | POA: Diagnosis not present

## 2020-11-10 DIAGNOSIS — Z20822 Contact with and (suspected) exposure to covid-19: Secondary | ICD-10-CM | POA: Diagnosis not present

## 2020-11-11 DIAGNOSIS — F1510 Other stimulant abuse, uncomplicated: Secondary | ICD-10-CM | POA: Diagnosis not present

## 2020-11-11 DIAGNOSIS — F419 Anxiety disorder, unspecified: Secondary | ICD-10-CM | POA: Diagnosis not present

## 2020-11-11 DIAGNOSIS — N183 Chronic kidney disease, stage 3 unspecified: Secondary | ICD-10-CM | POA: Diagnosis not present

## 2020-11-11 DIAGNOSIS — N179 Acute kidney failure, unspecified: Secondary | ICD-10-CM | POA: Diagnosis not present

## 2020-11-11 DIAGNOSIS — T796XXA Traumatic ischemia of muscle, initial encounter: Secondary | ICD-10-CM | POA: Diagnosis not present

## 2020-11-11 DIAGNOSIS — G4733 Obstructive sleep apnea (adult) (pediatric): Secondary | ICD-10-CM | POA: Diagnosis not present

## 2020-11-11 DIAGNOSIS — I129 Hypertensive chronic kidney disease with stage 1 through stage 4 chronic kidney disease, or unspecified chronic kidney disease: Secondary | ICD-10-CM | POA: Diagnosis not present

## 2020-11-11 DIAGNOSIS — I6789 Other cerebrovascular disease: Secondary | ICD-10-CM | POA: Diagnosis not present

## 2020-11-11 DIAGNOSIS — J449 Chronic obstructive pulmonary disease, unspecified: Secondary | ICD-10-CM | POA: Diagnosis not present

## 2020-11-11 DIAGNOSIS — I480 Paroxysmal atrial fibrillation: Secondary | ICD-10-CM | POA: Diagnosis not present

## 2020-11-11 DIAGNOSIS — E1122 Type 2 diabetes mellitus with diabetic chronic kidney disease: Secondary | ICD-10-CM | POA: Diagnosis not present

## 2020-11-11 DIAGNOSIS — Z20822 Contact with and (suspected) exposure to covid-19: Secondary | ICD-10-CM | POA: Diagnosis not present

## 2020-11-11 DIAGNOSIS — M6282 Rhabdomyolysis: Secondary | ICD-10-CM | POA: Diagnosis not present

## 2020-11-18 ENCOUNTER — Telehealth: Payer: Self-pay | Admitting: *Deleted

## 2020-11-18 NOTE — Chronic Care Management (AMB) (Signed)
  Chronic Care Management   Outreach Note  11/18/2020 Name: Raymond Hardin MRN: [id number] DOB: 1941-04-19  Raymond Hardin is a 80 y.o. year old male who is a primary care patient of Donella Stade, [place]. I reached out to Raymond Hardin by phone today in response to a referral sent by Mr. Copper Basnett Ohms's PCP Donella Stade, PA-C     An unsuccessful telephone outreach was attempted today. The patient was referred to the case management team for assistance with care management and care coordination.   Follow Up Plan: A HIPAA compliant phone message was left for the patient providing contact information and requesting a return call.  If patient returns call to provider office, please advise to call Embedded Care Management Care Guide Coolidge Gossard at [place], [place] Management  Direct Dial: [phone]

## 2020-11-27 NOTE — Chronic Care Management (AMB) (Signed)
  Chronic Care Management   Outreach Note  11/27/2020 Name: Raymond Hardin MRN: [id number] DOB: 1941/02/28  Raymond Hardin is a 80 y.o. year old male who is a primary care patient of Donella Stade, [place]. I reached out to Raymond Hardin by phone today in response to a referral sent by Mr. Yeshaya Fornash Bolt's PCP Donella Stade, PA-C     A second unsuccessful telephone outreach was attempted today. The patient was referred to the case management team for assistance with care management and care coordination.   Follow Up Plan: A HIPAA compliant phone message was left for the patient providing contact information and requesting a return call.  If patient returns call to provider office, please advise to call Embedded Care Management Care Guide Jessica Checketts at [place], [place] Management  Direct Dial: [phone]

## 2020-12-01 DIAGNOSIS — Z20822 Contact with and (suspected) exposure to covid-19: Secondary | ICD-10-CM | POA: Diagnosis not present

## 2020-12-04 NOTE — Chronic Care Management (AMB) (Signed)
  Chronic Care Management   Outreach Note  12/04/2020 Name: Raymond Hardin MRN: [id number] DOB: 09/07/1940  Raymond Hardin is a 79 y.o. year old male who is a primary care patient of Donella Stade, PA-C. I reached out to Raymond Hardin by phone today in response to a referral sent by Mr. Chawn Piech Sholtz's PCP Donella Stade, PA-C     Third unsuccessful telephone outreach was attempted today. The care management team is pleased to engage with this patient at any time in the future should he/she be interested in assistance from the care management team.   Follow Up Plan: We have been unable to make contact with the patient for follow up.    Julian Hy, [place] Management  Direct Dial: [phone]

## 2020-12-16 ENCOUNTER — Other Ambulatory Visit: Payer: Self-pay

## 2020-12-16 ENCOUNTER — Ambulatory Visit (INDEPENDENT_AMBULATORY_CARE_PROVIDER_SITE_OTHER): Payer: Medicare HMO | Admitting: Physician Assistant

## 2020-12-16 DIAGNOSIS — Z5329 Procedure and treatment not carried out because of patient's decision for other reasons: Secondary | ICD-10-CM

## 2020-12-16 NOTE — Progress Notes (Signed)
No show

## 2020-12-18 ENCOUNTER — Telehealth: Payer: Self-pay | Admitting: Physician Assistant

## 2020-12-18 NOTE — Progress Notes (Signed)
  Care Management   Follow Up Note   12/18/2020 Name: Raymond Hardin MRN: [id number] DOB: 10/11/1940   Referred by: Donella Stade, PA-C Reason for referral : No chief complaint on file.   An unsuccessful telephone outreach was attempted today. The patient was referred to the case management team for assistance with care management and care coordination.   Follow Up Plan: The care management team will reach out to the patient again over the next 7 days.   [place]

## 2020-12-20 IMAGING — DX DG CHEST 2V
2 series · 2 of 2 positions shown · non-contrast
Comparison: None.

CLINICAL DATA: Short of breath

EXAM:
CHEST - 2 VIEW

[chest pa]
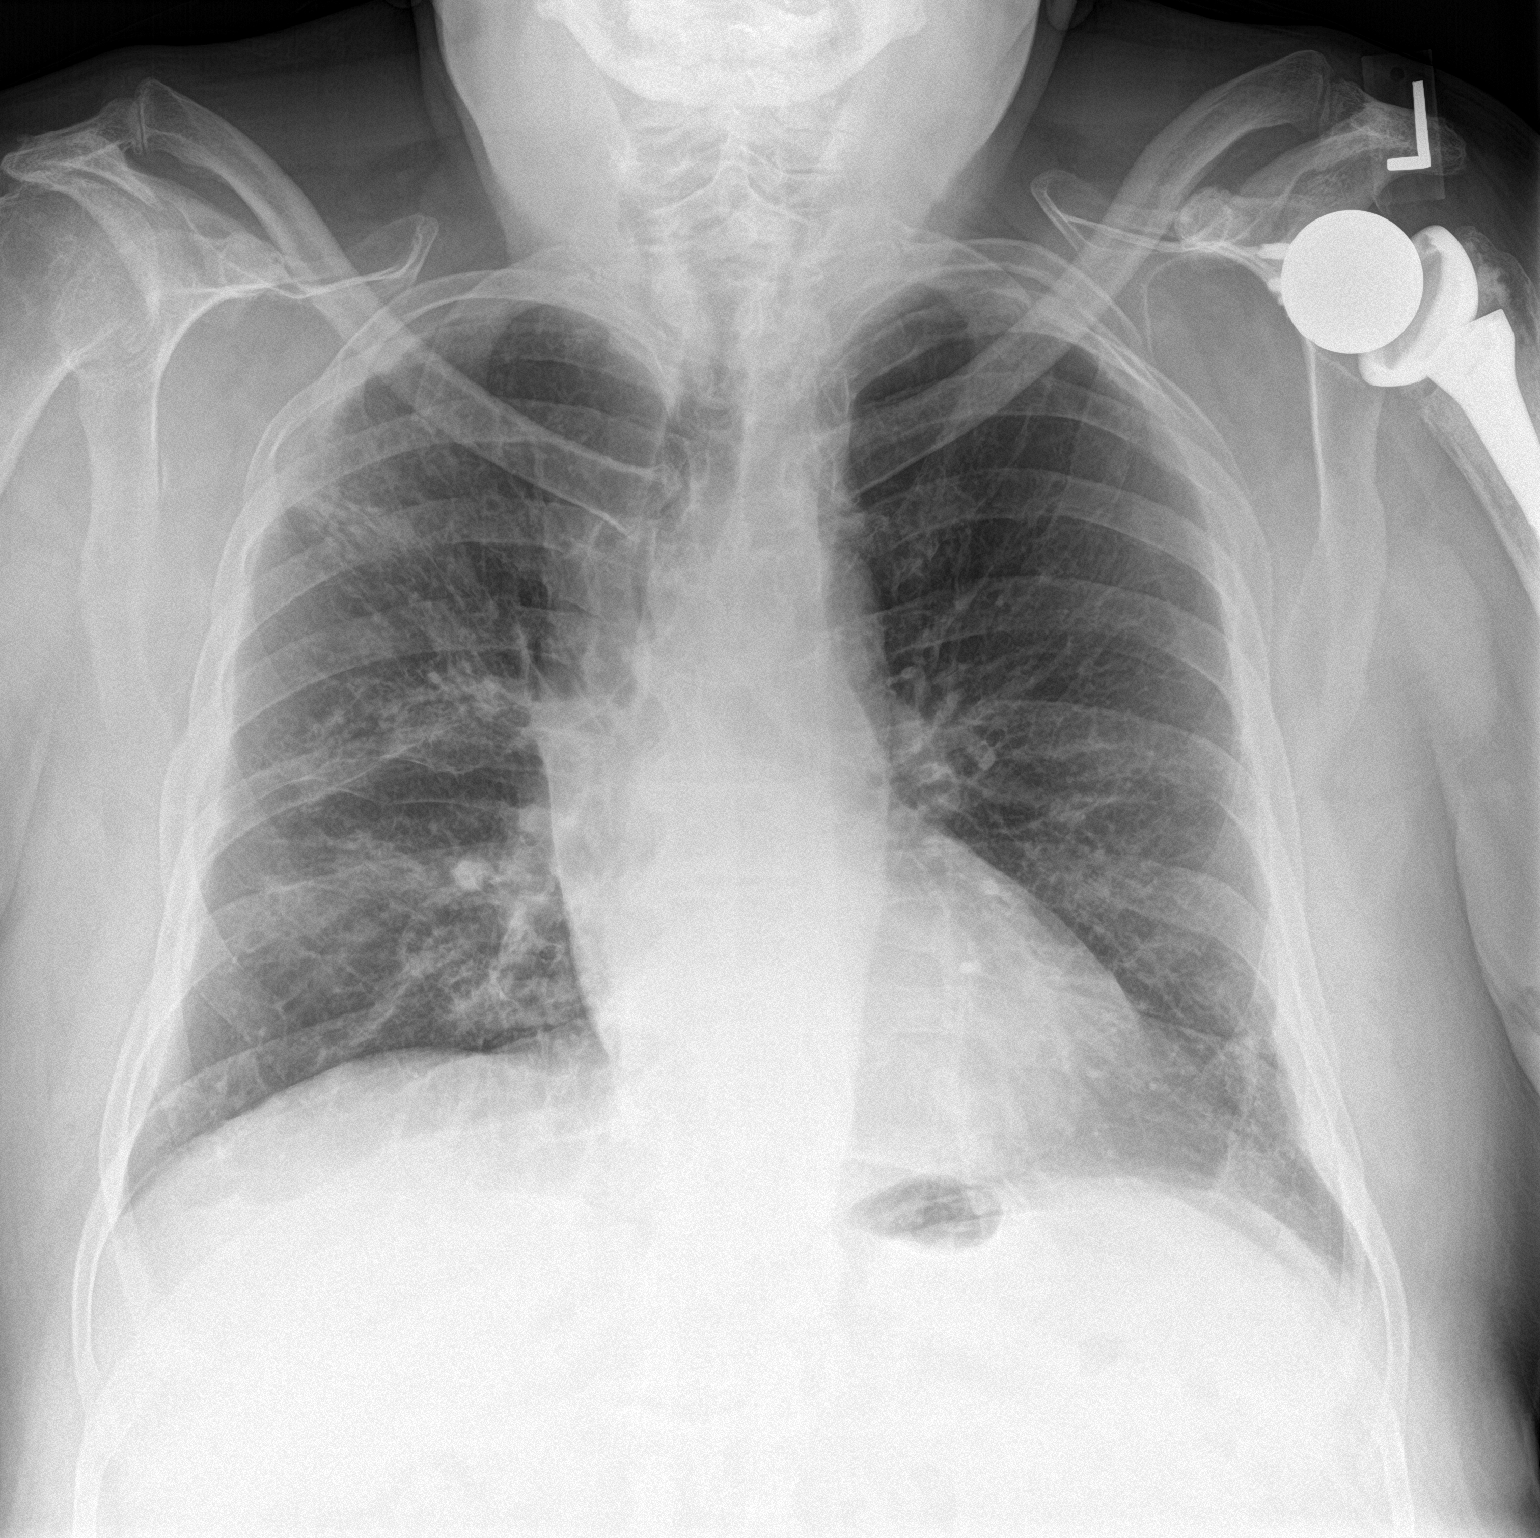

[chest lat]
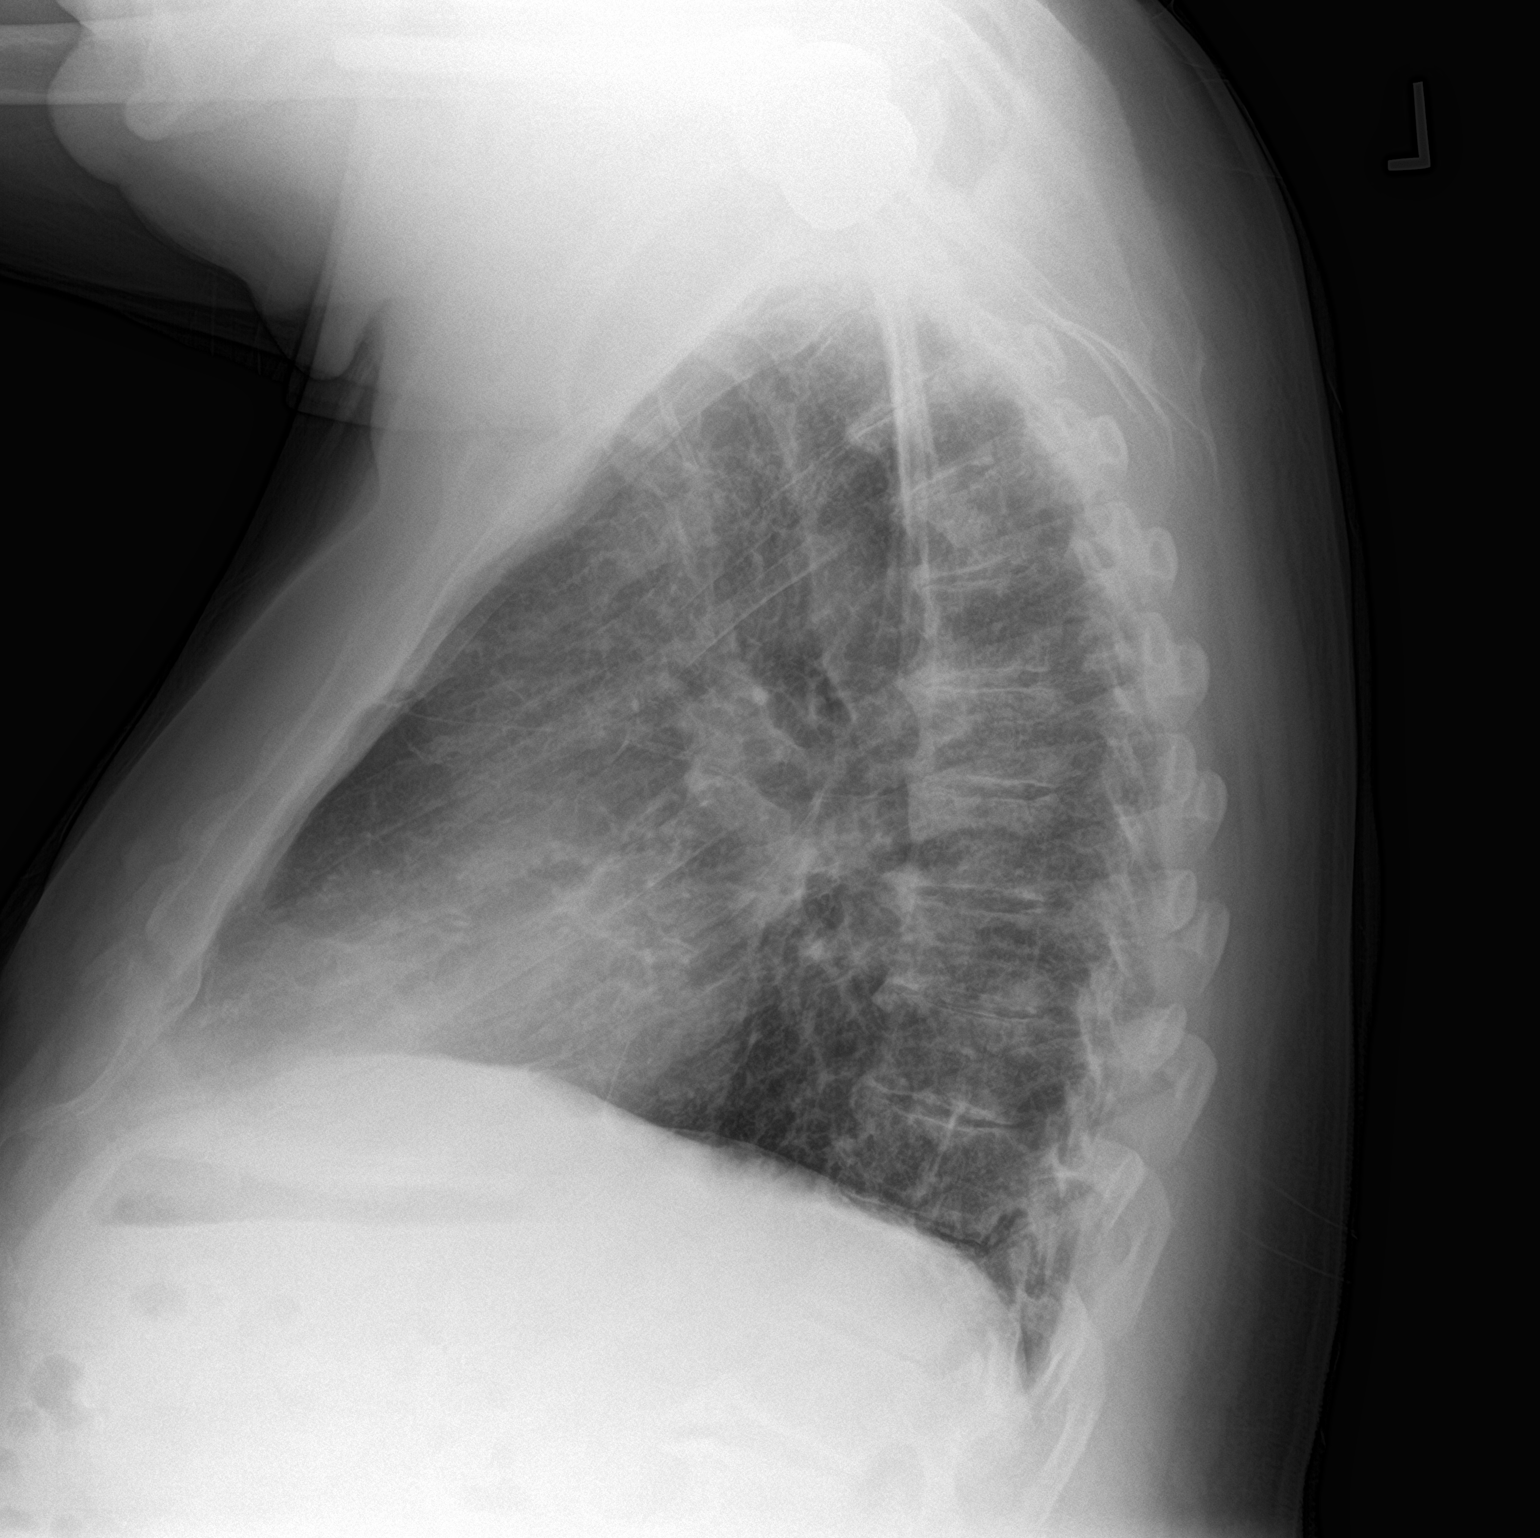

[2 of 2 positions shown; findings below may reference images not displayed]

FINDINGS: There are scattered airspace opacities bilaterally most evident in
the right lower, right mid, and right upper lung zones. There is no
pneumothorax. No large pleural effusion. The heart size is normal.
The patient is status post total shoulder arthroplasty on the left.
There is no acute osseous abnormality.
IMPRESSION: 1. Multi focal airspace opacities bilaterally compatible with
multifocal pneumonia (viral or bacterial).
2. No pneumothorax or large pleural effusion.

## 2021-01-12 ENCOUNTER — Other Ambulatory Visit: Payer: Self-pay

## 2021-01-12 DIAGNOSIS — F411 Generalized anxiety disorder: Secondary | ICD-10-CM

## 2021-01-12 NOTE — Telephone Encounter (Signed)
Pt has recent hx of meth use and fall. We can no longer refill controlled substances for patient.

## 2021-01-12 NOTE — Telephone Encounter (Signed)
Patient advised.

## 2021-01-26 ENCOUNTER — Other Ambulatory Visit: Payer: Self-pay | Admitting: Physician Assistant

## 2021-01-26 DIAGNOSIS — F411 Generalized anxiety disorder: Secondary | ICD-10-CM

## 2021-02-17 ENCOUNTER — Other Ambulatory Visit: Payer: Self-pay | Admitting: Physician Assistant

## 2021-02-17 DIAGNOSIS — I10 Essential (primary) hypertension: Secondary | ICD-10-CM

## 2021-07-14 ENCOUNTER — Ambulatory Visit (INDEPENDENT_AMBULATORY_CARE_PROVIDER_SITE_OTHER): Payer: Medicare HMO | Admitting: Physician Assistant

## 2021-07-14 DIAGNOSIS — Z0000 Encounter for general adult medical examination without abnormal findings: Secondary | ICD-10-CM | POA: Diagnosis not present

## 2021-07-14 NOTE — Progress Notes (Signed)
MEDICARE ANNUAL WELLNESS VISIT  07/14/2021  Telephone Visit Disclaimer This Medicare AWV was conducted by telephone due to national recommendations for restrictions regarding the COVID-19 Pandemic (e.g. social distancing).  I verified, using two identifiers, that I am speaking with Raymond Hardin or their authorized healthcare agent. I discussed the limitations, risks, security, and privacy concerns of performing an evaluation and management service by telephone and the potential availability of an in-person appointment in the future. The patient expressed understanding and agreed to proceed.  Location of Patient: Home Location of Provider (nurse):  Provider Home  Subjective:    Raymond Hardin is a 81 y.o. male patient of Alden Hipp, Royetta Car, PA-C who had a [employer] Visit today via telephone. Raymond Hardin is Retired and lives alone. he has 1 child (but he hasn't talked to him in five years). he reports that he is socially active and does interact with friends/family regularly. he is minimally physically active and enjoys riding his motorcycle.  Patient Care Team: Lavada Mesi as PCP - General (Family Medicine)  Advanced Directives 07/14/2021 06/08/2020 02/04/2019 01/22/2018  Does Patient Have a Medical Advance Directive? No Yes No No  Type of Advance Directive - Healthcare Power of [place];Living will - -  Does patient want to make changes to medical advance directive? - No - Patient declined - -  Copy of [place] in Chart? - No - copy requested - -  Would patient like information on creating a medical advance directive? No - Patient declined - No - Patient declined Yes (MAU/Ambulatory/Procedural Areas - Information given)    Hospital Utilization Over the Past 12 Months: # of hospitalizations or ER visits: 0 # of surgeries: 0  Review of Systems    Patient reports that his overall health is unchanged compared to last year.  History obtained from  chart review and the patient  Patient Reported Readings (BP, Pulse, CBG, Weight, etc) none  Pain Assessment Pain : No/denies pain     Current Medications & Allergies (verified) Allergies as of 07/14/2021   No Known Allergies      Medication List        Accurate as of July 14, 2021  2:34 PM. If you have any questions, ask your nurse or doctor.          albuterol 108 (90 Base) MCG/ACT inhaler Commonly known as: VENTOLIN HFA Inhale 2 puffs into the lungs every 6 (six) hours as needed.   ALPRAZolam 0.5 MG tablet Commonly known as: XANAX TAKE 1 TABLET(0.5 MG) BY MOUTH AT BEDTIME AS NEEDED FOR ANXIETY   ARIPiprazole 2 MG tablet Commonly known as: Abilify Take 1 tablet (2 mg total) by mouth daily.   atorvastatin 20 MG tablet Commonly known as: LIPITOR Take 1 tablet (20 mg total) by mouth daily.   busPIRone 15 MG tablet Commonly known as: BUSPAR Take 1 tablet (15 mg total) by mouth 3 (three) times daily.   cholecalciferol 1000 units tablet Commonly known as: VITAMIN D Take 1,000 Units by mouth daily.   diclofenac sodium 1 % Gel Commonly known as: VOLTAREN Apply 4 g topically 4 (four) times daily. To affected joint.   metFORMIN 1000 MG tablet Commonly known as: GLUCOPHAGE Take 1 tablet (1,000 mg total) by mouth 2 (two) times daily with a meal.   metoprolol succinate 100 MG 24 hr tablet Commonly known as: TOPROL-XL Take 1 tablet (100 mg total) by mouth daily.   sildenafil 20 MG  tablet Commonly known as: REVATIO TAKE 5 TABLETS BY MOUTH AS NEEDED FOR ERECTILE DYSFUNCTION   Trelegy Ellipta 100-62.5-25 MCG/ACT Aepb Generic drug: Fluticasone-Umeclidin-Vilant Inhale 1 puff into the lungs daily.   valsartan 160 MG tablet Commonly known as: DIOVAN Take 1 tablet (160 mg total) by mouth daily. Needs appointment.   vortioxetine HBr 20 MG Tabs tablet Commonly known as: Trintellix Take 1 tablet (20 mg total) by mouth daily.   Xarelto 20 MG Tabs tablet Generic  drug: rivaroxaban Take 1 tablet (20 mg total) by mouth daily.        History (reviewed): Past Medical History:  Diagnosis Date   COPD (chronic obstructive pulmonary disease) ([place])    COVID-19 03/2019   Depression    Diabetes mellitus without complication ([place])    Hyperlipidemia    Hypertension    Insomnia 2005   Osteoarthritis 2000   Paroxysmal A-fib ([place])    Pneumonia    Past Surgical History:  Procedure Laterality Date   CATARACT EXTRACTION, BILATERAL  2020   EYE SURGERY     JOINT REPLACEMENT     left shoulder replacement  2011   REPLACEMENT TOTAL KNEE Bilateral    Family History  Problem Relation Age of Onset   Cancer Mother    Suicidality Father 33   Depression Father    COPD Sister    Social History   Socioeconomic History   Marital status: Legally Separated    Spouse name: Not on file   Number of children: 1   Years of education: 12   Highest education level: 12th grade  Occupational History   Occupation: retired    Comment: truck Geophysicist/field seismologist  Tobacco Use   Smoking status: Every Day    Packs/day: 0.50    Years: 60.00    Pack years: 30.00    Types: Cigarettes   Smokeless tobacco: Current    Types: Chew  Vaping Use   Vaping Use: Never used  Substance and Sexual Activity   Alcohol use: Not Currently    Alcohol/week: 0.0 standard drinks    Comment: occasional   Drug use: No   Sexual activity: Yes    Partners: Female  Other Topics Concern   Not on file  Social History Narrative   Lives alone. Likes to drive his motorcycle; but he has sold his but hoping to getting another one.   Social Determinants of Health   Financial Resource Strain: Low Risk    Difficulty of Paying Living Expenses: Not hard at all  Food Insecurity: No Food Insecurity   Worried About Charity fundraiser in the Last Year: Never true   [place] in the Last Year: Never true  Transportation Needs: No Transportation Needs   Lack of Transportation (Medical): No   Lack of  Transportation (Non-Medical): No  Physical Activity: Inactive   Days of Exercise per Week: 0 days   Minutes of Exercise per Session: 0 min  Stress: No Stress Concern Present   Feeling of Stress : Not at all  Social Connections: Socially Isolated   Frequency of Communication with Friends and Family: Never   Frequency of Social Gatherings with Friends and Family: Never   Attends Religious Services: More than 4 times per year   Active Member of [employer] or Organizations: No   Attends Archivist Meetings: Never   Marital Status: Separated    Activities of Daily Living In your present state of health, do you have any difficulty performing the  following activities: 07/14/2021  Hearing? N  Vision? N  Difficulty concentrating or making decisions? N  Walking or climbing stairs? Y  Comment He does not walk up the steps.  Dressing or bathing? N  Doing errands, shopping? N  Preparing Food and eating ? N  Using the Toilet? N  In the past six months, have you accidently leaked urine? N  Do you have problems with loss of bowel control? N  Managing your Medications? N  Managing your Finances? N  Housekeeping or managing your Housekeeping? N  Some recent data might be hidden    Patient Education/ Literacy How often do you need to have someone help you when you read instructions, pamphlets, or other written materials from your doctor or pharmacy?: 1 - Never What is the last grade level you completed in school?: 12th grade  Exercise Current Exercise Habits: Home exercise routine, Type of exercise: walking, Time (Minutes): 30, Frequency (Times/Week): 5, Weekly Exercise (Minutes/Week): 150, Intensity: Mild, Exercise limited by: None identified  Diet Patient reports consuming  2-3  meals a day and 1 snack(s) a day Patient reports that his primary diet is: Regular Patient reports that she does have regular access to food.   Depression Screen PHQ 2/9 Scores 07/14/2021 09/15/2020 07/06/2020  06/08/2020 10/01/2019 02/04/2019 10/24/2018  PHQ - 2 Score 0 0 0 2 5 0 2  PHQ- 9 Score 0 - '6 5 17 '$ - 9  Exception Documentation - - - - - - -     Fall Risk Fall Risk  07/14/2021 09/15/2020 07/06/2020 06/08/2020 02/04/2019  Falls in the past year? 1 0 1 1 0  Number falls in past yr: 0 0 1 1 0  Injury with Fall? 1 0 1 1 0  Risk for fall due to : History of fall(s) No Fall Risks History of fall(s) Impaired balance/gait -  Follow up Falls evaluation completed Falls evaluation completed Falls evaluation completed Falls evaluation completed;Education provided;Falls prevention discussed Falls prevention discussed     Objective:  Raymond Hardin seemed alert and oriented and he participated appropriately during our telephone visit.  Blood Pressure Weight BMI  BP Readings from Last 3 Encounters:  09/15/20 (!) 156/87  08/17/20 (!) 137/50  07/06/20 (!) 166/97   Wt Readings from Last 3 Encounters:  09/15/20 226 lb (102.5 kg)  08/17/20 225 lb (102.1 kg)  07/06/20 227 lb (103 kg)   BMI Readings from Last 1 Encounters:  09/15/20 31.97 kg/m    *Unable to obtain current vital signs, weight, and BMI due to telephone visit type  Hearing/Vision  Lynnae Sandhoff did not seem to have difficulty with hearing/understanding during the telephone conversation Reports that he has not had a formal eye exam by an eye care professional within the past year Reports that he has not had a formal hearing evaluation within the past year *Unable to fully assess hearing and vision during telephone visit type  Cognitive Function: 6CIT Screen 07/14/2021 06/08/2020 02/04/2019 01/22/2018  What Year? 0 points 0 points 0 points 0 points  What month? 0 points 0 points 0 points 0 points  What time? 0 points 0 points 0 points 0 points  Count back from 20 0 points 0 points 0 points 0 points  Months in reverse 4 points 2 points 0 points 2 points  Repeat phrase 0 points 0 points 0 points 0 points  Total Score 4 2 0 2   (Normal:0-7,  Significant for Dysfunction: >8)  Normal Cognitive Function Screening: Yes  Immunization & Health Maintenance Record Immunization History  Administered Date(s) Administered   Fluad Quad(high Dose 65+) 01/25/2019   Influenza,inj,Quad PF,6+ Mos 01/23/2013, 02/23/2015, 03/01/2016, 01/31/2017, 01/05/2018   Influenza-Unspecified 05/15/2020   Janssen (J&J) SARS-COV-2 Vaccination 08/16/2019, 05/29/2020   Pneumococcal Conjugate-13 08/13/2014   Pneumococcal Polysaccharide-23 07/01/2011   Tdap 01/05/2018    Health Maintenance  Topic Date Due   FOOT EXAM  Never done   OPHTHALMOLOGY EXAM  Never done   HEMOGLOBIN A1C  03/18/2021   TETANUS/TDAP  06/30/2030   Pneumonia Vaccine 96+ Years old  Completed   INFLUENZA VACCINE  Completed   COVID-19 Vaccine  Completed   Hepatitis C Screening  Completed   Zoster Vaccines- Shingrix  Completed   HPV VACCINES  Aged Out   COLONOSCOPY (Pts 45-18yr Insurance coverage will need to be confirmed)  Discontinued       Assessment  This is a routine wellness examination for [employer]  Health Maintenance: Due or Overdue Health Maintenance Due  Topic Date Due   FOOT EXAM  Never done   OPHTHALMOLOGY EXAM  Never done   HEMOGLOBIN A1C  03/18/2021    VWendie Simmerdoes not need a referral for Community Assistance: Care Management:   no Social Work:    no Prescription Assistance:  no Nutrition/Diabetes Education:  no   Plan:  Personalized Goals  Goals Addressed               This Visit's Progress     Patient Stated (pt-stated)        07/14/2021 AWV Goal: Exercise for General Health  Patient will verbalize understanding of the benefits of increased physical activity: Exercising regularly is important. It will improve your overall fitness, flexibility, and endurance. Regular exercise also will improve your overall health. It can help you control your weight, reduce stress, and improve your bone density. Over the next year, patient will  increase physical activity as tolerated with a goal of at least 150 minutes of moderate physical activity per week.  You can tell that you are exercising at a moderate intensity if your heart starts beating faster and you start breathing faster but can still hold a conversation. Moderate-intensity exercise ideas include: Walking 1 mile (1.6 km) in about 15 minutes Biking Hiking Golfing Dancing Water aerobics Patient will verbalize understanding of everyday activities that increase physical activity by providing examples like the following: Yard work, such as: PSales promotion account executiveGardening Washing windows or floors Patient will be able to explain general safety guidelines for exercising:  Before you start a new exercise program, talk with your health care provider. Do not exercise so much that you hurt yourself, feel dizzy, or get very short of breath. Wear comfortable clothes and wear shoes with good support. Drink plenty of water while you exercise to prevent dehydration or heat stroke. Work out until your breathing and your heartbeat get faster.        Personalized Health Maintenance & Screening Recommendations  Eye exam Foot exam Hemoglobin A1C  Lung Cancer Screening Recommended: yes; declined- [place] [facility] [place] (Low Dose CT Chest recommended if Age 79-80years, 30 pack-year currently smoking OR have quit w/in past 15 years) Hepatitis C Screening recommended: no HIV Screening recommended: no  Advanced Directives: Written information was not prepared per patient's request.  Referrals & Orders No orders of the defined types  were placed in this encounter.   Follow-up Plan Follow-up with Donella Stade, PA-C as planned Schedule your eye exam.  Foot exam and hemoglobin A1C can be done at your next in office visit.  Medicare wellness visit in one year.  AVS  printed and mailed to the patient.   I have personally reviewed and noted the following in the patients chart:   Medical and social history Use of alcohol, tobacco or illicit drugs  Current medications and supplements Functional ability and status Nutritional status Physical activity Advanced directives List of other physicians Hospitalizations, surgeries, and ER visits in previous 12 months Vitals Screenings to include cognitive, depression, and falls Referrals and appointments  In addition, I have reviewed and discussed with Raymond Hardin certain preventive protocols, quality metrics, and best practice recommendations. A written personalized care plan for preventive services as well as general preventive health recommendations is available and can be mailed to the patient at his request.      Tinnie Gens, RN  07/14/2021

## 2021-07-14 NOTE — Patient Instructions (Addendum)
[place] Maintenance Summary and Written Plan of Care  Raymond Hardin ,  Thank you for allowing me to perform your Medicare Annual Wellness Visit and for your ongoing commitment to your health.   Health Maintenance & Immunization History Health Maintenance  Topic Date Due   OPHTHALMOLOGY EXAM  07/14/2021 (Originally 11/26/1950)   FOOT EXAM  07/15/2021 (Originally 11/26/1950)   HEMOGLOBIN A1C  07/15/2021 (Originally 03/18/2021)   TETANUS/TDAP  06/30/2030   Pneumonia Vaccine 44+ Years old  Completed   INFLUENZA VACCINE  Completed   COVID-19 Vaccine  Completed   Hepatitis C Screening  Completed   Zoster Vaccines- Shingrix  Completed   HPV VACCINES  Aged Out   COLONOSCOPY (Pts 45-35yr Insurance coverage will need to be confirmed)  Discontinued   Immunization History  Administered Date(s) Administered   Fluad Quad(high Dose 65+) 01/25/2019, 05/15/2020, 04/16/2021   Influenza, High Dose Seasonal PF 02/27/2009   Influenza,inj,Quad PF,6+ Mos 01/23/2013, 02/23/2015, 03/01/2016, 01/31/2017, 01/05/2018   Influenza-Unspecified 03/09/2004, 04/20/2005, 04/25/2006, 05/24/2007, 02/08/2008, 03/23/2010, 02/02/2011, 03/09/2012, 01/07/2013, 05/15/2020   Janssen (J&J) SARS-COV-2 Vaccination 08/16/2019, 05/29/2020   Moderna Covid-19 Vaccine Bivalent Booster 127yr& up 04/16/2021   Moderna Sars-Covid-2 Vaccination 12/18/2020   Pneumococcal Conjugate-13 08/13/2014   Pneumococcal Polysaccharide-23 07/01/2011, 04/16/2013, 11/11/2020   Pneumococcal-Unspecified 05/07/2004   Td 06/30/2020   Tdap 05/10/2007, 01/05/2018   Zoster Recombinat (Shingrix) 06/30/2020, 09/30/2020    These are the patient goals that we discussed:  Goals Addressed              This Visit's Progress     Patient Stated (pt-stated)        07/14/2021 AWV Goal: Exercise for General Health  Patient will verbalize understanding of the benefits of increased physical  activity: Exercising regularly is important. It will improve your overall fitness, flexibility, and endurance. Regular exercise also will improve your overall health. It can help you control your weight, reduce stress, and improve your bone density. Over the next year, patient will increase physical activity as tolerated with a goal of at least 150 minutes of moderate physical activity per week.  You can tell that you are exercising at a moderate intensity if your heart starts beating faster and you start breathing faster but can still hold a conversation. Moderate-intensity exercise ideas include: Walking 1 mile (1.6 km) in about 15 minutes Biking Hiking Golfing Dancing Water aerobics Patient will verbalize understanding of everyday activities that increase physical activity by providing examples like the following: Yard work, such as: PuSales promotion account executiveardening Washing windows or floors Patient will be able to explain general safety guidelines for exercising:  Before you start a new exercise program, talk with your health care provider. Do not exercise so much that you hurt yourself, feel dizzy, or get very short of breath. Wear comfortable clothes and wear shoes with good support. Drink plenty of water while you exercise to prevent dehydration or heat stroke. Work out until your breathing and your heartbeat get faster.         This is a list of Health Maintenance Items that are overdue or due now: Eye exam Foot exam Hemoglobin A1C  Orders/Referrals Placed Today: No orders of the defined types were placed in this encounter.  (Contact our referral department at [phone] you have not spoken with someone about your referral appointment within the next 5 days)    Follow-up Plan Follow-up  with Donella Stade, PA-C as planned Schedule your eye exam.  Foot exam and hemoglobin A1C can be  done at your next in office visit.  Medicare wellness visit in one year.  AVS printed and mailed to the patient.      Health Maintenance, Male Adopting a healthy lifestyle and getting preventive care are important in promoting health and wellness. Ask your health care provider about: The right schedule for you to have regular tests and exams. Things you can do on your own to prevent diseases and keep yourself healthy. What should I know about diet, weight, and exercise? Eat a healthy diet  Eat a diet that includes plenty of vegetables, fruits, low-fat dairy products, and lean protein. Do not eat a lot of foods that are high in solid fats, added sugars, or sodium. Maintain a healthy weight Body mass index (BMI) is a measurement that can be used to identify possible weight problems. It estimates body fat based on height and weight. Your health care provider can help determine your BMI and help you achieve or maintain a healthy weight. Get regular exercise Get regular exercise. This is one of the most important things you can do for your health. Most adults should: Exercise for at least 150 minutes each week. The exercise should increase your heart rate and make you sweat (moderate-intensity exercise). Do strengthening exercises at least twice a week. This is in addition to the moderate-intensity exercise. Spend less time sitting. Even light physical activity can be beneficial. Watch cholesterol and blood lipids Have your blood tested for lipids and cholesterol at 79 years of age, then have this test every 5 years. You may need to have your cholesterol levels checked more often if: Your lipid or cholesterol levels are high. You are older than 79 years of age. You are at high risk for heart disease. What should I know about cancer screening? Many types of cancers can be detected early and may often be prevented. Depending on your health history and family history, you may need to have  cancer screening at various ages. This may include screening for: Colorectal cancer. Prostate cancer. Skin cancer. Lung cancer. What should I know about heart disease, diabetes, and high blood pressure? Blood pressure and heart disease High blood pressure causes heart disease and increases the risk of stroke. This is more likely to develop in people who have high blood pressure readings or are overweight. Talk with your health care provider about your target blood pressure readings. Have your blood pressure checked: Every 3-5 years if you are 18-20 years of age. Every year if you are 7 years old or older. If you are between the ages of 4 and 16 and are a current or former smoker, ask your health care provider if you should have a one-time screening for abdominal aortic aneurysm (AAA). Diabetes Have regular diabetes screenings. This checks your fasting blood sugar level. Have the screening done: Once every three years after age 4 if you are at a normal weight and have a low risk for diabetes. More often and at a younger age if you are overweight or have a high risk for diabetes. What should I know about preventing infection? Hepatitis B If you have a higher risk for hepatitis B, you should be screened for this virus. Talk with your health care provider to find out if you are at risk for hepatitis B infection. Hepatitis C Blood testing is recommended for: Everyone born from [birth year] through 1963.  Anyone with known risk factors for hepatitis C. Sexually transmitted infections (STIs) You should be screened each year for STIs, including gonorrhea and chlamydia, if: You are sexually active and are younger than 79 years of age. You are older than 79 years of age and your health care provider tells you that you are at risk for this type of infection. Your sexual activity has changed since you were last screened, and you are at increased risk for chlamydia or gonorrhea. Ask your health care  provider if you are at risk. Ask your health care provider about whether you are at high risk for HIV. Your health care provider may recommend a prescription medicine to help prevent HIV infection. If you choose to take medicine to prevent HIV, you should first get tested for HIV. You should then be tested every 3 months for as long as you are taking the medicine. Follow these instructions at home: Alcohol use Do not drink alcohol if your health care provider tells you not to drink. If you drink alcohol: Limit how much you have to 0-2 drinks a day. Know how much alcohol is in your drink. In the [place] one drink equals one 12 oz bottle of beer (355 mL), one 5 oz glass of wine (148 mL), or one 1 oz glass of hard liquor (44 mL). Lifestyle Do not use any products that contain nicotine or tobacco. These products include cigarettes, chewing tobacco, and vaping devices, such as e-cigarettes. If you need help quitting, ask your health care provider. Do not use street drugs. Do not share needles. Ask your health care provider for help if you need support or information about quitting drugs. General instructions Schedule regular health, dental, and eye exams. Stay current with your vaccines. Tell your health care provider if: You often feel depressed. You have ever been abused or do not feel safe at home. Summary Adopting a healthy lifestyle and getting preventive care are important in promoting health and wellness. Follow your health care provider's instructions about healthy diet, exercising, and getting tested or screened for diseases. Follow your health care provider's instructions on monitoring your cholesterol and blood pressure. This information is not intended to replace advice given to you by your health care provider. Make sure you discuss any questions you have with your health care provider. Document Revised: 09/14/2020 Document Reviewed: 09/14/2020 Elsevier Patient Education  [place].

## 2021-07-19 ENCOUNTER — Other Ambulatory Visit: Payer: Self-pay

## 2021-07-19 ENCOUNTER — Ambulatory Visit (INDEPENDENT_AMBULATORY_CARE_PROVIDER_SITE_OTHER): Payer: Medicare HMO | Admitting: Physician Assistant

## 2021-07-19 VITALS — BP 190/100 | HR 84 | Ht 70.5 in | Wt 235.0 lb

## 2021-07-19 DIAGNOSIS — I480 Paroxysmal atrial fibrillation: Secondary | ICD-10-CM | POA: Diagnosis not present

## 2021-07-19 DIAGNOSIS — Z79899 Other long term (current) drug therapy: Secondary | ICD-10-CM

## 2021-07-19 DIAGNOSIS — I10 Essential (primary) hypertension: Secondary | ICD-10-CM | POA: Diagnosis not present

## 2021-07-19 DIAGNOSIS — Z125 Encounter for screening for malignant neoplasm of prostate: Secondary | ICD-10-CM

## 2021-07-19 DIAGNOSIS — E1122 Type 2 diabetes mellitus with diabetic chronic kidney disease: Secondary | ICD-10-CM | POA: Diagnosis not present

## 2021-07-19 DIAGNOSIS — E782 Mixed hyperlipidemia: Secondary | ICD-10-CM | POA: Diagnosis not present

## 2021-07-19 DIAGNOSIS — N1831 Chronic kidney disease, stage 3a: Secondary | ICD-10-CM | POA: Diagnosis not present

## 2021-07-19 DIAGNOSIS — F3341 Major depressive disorder, recurrent, in partial remission: Secondary | ICD-10-CM

## 2021-07-19 DIAGNOSIS — F411 Generalized anxiety disorder: Secondary | ICD-10-CM

## 2021-07-19 DIAGNOSIS — J449 Chronic obstructive pulmonary disease, unspecified: Secondary | ICD-10-CM

## 2021-07-19 LAB — POCT GLYCOSYLATED HEMOGLOBIN (HGB A1C): Hemoglobin A1C: 5.9 % — AB (ref 4.0–5.6)

## 2021-07-19 MED ORDER — TRELEGY ELLIPTA 100-62.5-25 MCG/ACT IN AEPB: 1.0000 | INHALATION_SPRAY | Freq: Every day | RESPIRATORY_TRACT | 11 refills | Status: DC

## 2021-07-19 MED ORDER — METOPROLOL SUCCINATE ER 100 MG PO TB24: 100.0000 mg | ORAL_TABLET | Freq: Every day | ORAL | 1 refills | Status: DC

## 2021-07-19 MED ORDER — BUSPIRONE HCL 15 MG PO TABS: 15.0000 mg | ORAL_TABLET | Freq: Three times a day (TID) | ORAL | 1 refills | Status: DC

## 2021-07-19 MED ORDER — VALSARTAN 160 MG PO TABS: 160.0000 mg | ORAL_TABLET | Freq: Every day | ORAL | 0 refills | Status: DC

## 2021-07-19 MED ORDER — METFORMIN HCL 1000 MG PO TABS: 1000.0000 mg | ORAL_TABLET | Freq: Two times a day (BID) | ORAL | 1 refills | Status: DC

## 2021-07-19 MED ORDER — ALPRAZOLAM 0.5 MG PO TABS: ORAL_TABLET | ORAL | 5 refills | Status: DC

## 2021-07-19 MED ORDER — ARIPIPRAZOLE 2 MG PO TABS: 2.0000 mg | ORAL_TABLET | Freq: Every day | ORAL | 1 refills | Status: DC

## 2021-07-19 MED ORDER — ATORVASTATIN CALCIUM 20 MG PO TABS: 20.0000 mg | ORAL_TABLET | Freq: Every day | ORAL | 1 refills | Status: DC

## 2021-07-19 MED ORDER — XARELTO 20 MG PO TABS: 20.0000 mg | ORAL_TABLET | Freq: Every day | ORAL | 3 refills | Status: DC

## 2021-07-19 MED ORDER — VORTIOXETINE HBR 20 MG PO TABS: 20.0000 mg | ORAL_TABLET | Freq: Every day | ORAL | 1 refills | Status: DC

## 2021-07-19 NOTE — Progress Notes (Signed)
Subjective:    Patient ID: Raymond Hardin, male    DOB: 1940-06-22, 81 y.o.   MRN: [id number]  HPI Patient is a 81 year old obese male with hypertension, PAF, COPD, type 2 diabetes, hyperlipidemia who presents to the clinic to get back on medications.  He does report that he has been out of medications for at least a month maybe longer.  He denies any chest pain, palpitations, vision changes.  He does report some headaches.  He denies any worsening shortness of breath.  He is not checking his blood pressure at home.  Patient does not check his sugars or watch his diet.  He denies any open wounds or sores.  He denies any hypoglycemic events.  He has not been taking his mood medications either.  He does endorse a lot of anxiety and anxiousness.  .. Active Ambulatory Problems    Diagnosis Date Noted   Erectile dysfunction 11/04/2011   Tobacco abuse 12/12/2012   Depression 12/12/2012   Primary osteoarthritis of right knee 04/24/2013   Vitamin D deficiency 08/13/2013   Pre-diabetes 08/16/2013   Status post left knee replacement 08/13/2014   Anxiety state 08/13/2014   Essential hypertension, benign 08/13/2014   Hyperlipidemia 02/24/2015   Hypertriglyceridemia 02/24/2015   CKD (chronic kidney disease), stage III (HCC) 12/07/2015   S/P total knee replacement, right 01/31/2017   Bilateral cataracts 01/05/2018   Posterior vitreous detachment of left eye 04/09/2018   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   S/P bilateral cataract extraction 05/25/2018   Recurrent major depressive disorder, in partial remission (HCC) 06/19/2018   Skipped heart beats 09/17/2018   Strong pulse 09/17/2018   Palpitation 09/19/2018   Acute bilateral low back pain without sciatica 01/28/2019   PAF (paroxysmal atrial fibrillation) (HCC) 05/13/2019   Low blood potassium 05/13/2019   Acute encephalopathy 05/16/2019   COPD (chronic obstructive pulmonary disease) with chronic bronchitis (HCC) 05/16/2019   Hypotension  05/29/2019   History of COVID-19 06/11/2019   Mucopurulent chronic bronchitis (HCC) 06/11/2019   SOB (shortness of breath) on exertion 10/02/2019   Cough 11/04/2019   Type 2 diabetes mellitus with stage 3a chronic kidney disease, without long-term current use of insulin (HCC) 09/15/2020   Resolved Ambulatory Problems    Diagnosis Date Noted   Hypertension, essential, benign 11/04/2011   Pneumonia of both lungs due to infectious organism 05/16/2019   Past Medical History:  Diagnosis Date   COPD (chronic obstructive pulmonary disease) (HCC)    COVID-19 03/2019   Diabetes mellitus without complication (HCC)    Hypertension    Insomnia 2005   Osteoarthritis 2000   Paroxysmal A-fib (HCC)    Pneumonia        Review of Systems  All other systems reviewed and are negative.     Objective:   Physical Exam Vitals reviewed.  Constitutional:      Appearance: Normal appearance. He is obese.  HENT:     Head: Normocephalic.  Neck:     Vascular: No carotid bruit.  Cardiovascular:     Rate and Rhythm: Normal rate and regular rhythm.     Pulses: Normal pulses.     Heart sounds: Murmur heard.  Pulmonary:     Effort: Pulmonary effort is normal.     Breath sounds: Rhonchi present.  Neurological:     General: No focal deficit present.     Mental Status: He is alert and oriented to person, place, and time.  Psychiatric:        Mood and  Affect: Mood normal.     .. Results for orders placed or performed in visit on 07/19/21  PSA  Result Value Ref Range   PSA 3.38 < OR = 4.00 ng/mL  Lipid Panel w/reflex Direct LDL  Result Value Ref Range   Cholesterol 172 <200 mg/dL   HDL 46 > OR = 40 mg/dL   Triglycerides 409 (H) <150 mg/dL   LDL Cholesterol (Calc) 89 mg/dL (calc)   Total CHOL/HDL Ratio 3.7 <5.0 (calc)   Non-HDL Cholesterol (Calc) 126 <130 mg/dL (calc)  COMPLETE METABOLIC PANEL WITH GFR  Result Value Ref Range   Glucose, Bld 106 (H) 65 - 99 mg/dL   BUN 20 7 - 25 mg/dL    Creat 8.11 (H) 9.14 - 1.28 mg/dL   eGFR 47 (L) > OR = 60 mL/min/1.97m2   BUN/Creatinine Ratio 13 6 - 22 (calc)   Sodium 142 135 - 146 mmol/L   Potassium 4.2 3.5 - 5.3 mmol/L   Chloride 106 98 - 110 mmol/L   CO2 27 20 - 32 mmol/L   Calcium 8.6 8.6 - 10.3 mg/dL   Total Protein 7.1 6.1 - 8.1 g/dL   Albumin 4.0 3.6 - 5.1 g/dL   Globulin 3.1 1.9 - 3.7 g/dL (calc)   AG Ratio 1.3 1.0 - 2.5 (calc)   Total Bilirubin 0.4 0.2 - 1.2 mg/dL   Alkaline phosphatase (APISO) 52 35 - 144 U/L   AST 14 10 - 35 U/L   ALT 10 9 - 46 U/L  CBC with Differential/Platelet  Result Value Ref Range   WBC 7.0 3.8 - 10.8 Thousand/uL   RBC 5.04 4.20 - 5.80 Million/uL   Hemoglobin 16.0 13.2 - 17.1 g/dL   HCT 78.2 95.6 - 21.3 %   MCV 95.8 80.0 - 100.0 fL   MCH 31.7 27.0 - 33.0 pg   MCHC 33.1 32.0 - 36.0 g/dL   RDW 08.6 57.8 - 46.9 %   Platelets 180 140 - 400 Thousand/uL   MPV 11.5 7.5 - 12.5 fL   Neutro Abs 3,822 1,500 - 7,800 cells/uL   Lymphs Abs 2,436 850 - 3,900 cells/uL   Absolute Monocytes 588 200 - 950 cells/uL   Eosinophils Absolute 112 15 - 500 cells/uL   Basophils Absolute 42 0 - 200 cells/uL   Neutrophils Relative % 54.6 %   Total Lymphocyte 34.8 %   Monocytes Relative 8.4 %   Eosinophils Relative 1.6 %   Basophils Relative 0.6 %  POCT glycosylated hemoglobin (Hb A1C)  Result Value Ref Range   Hemoglobin A1C 5.9 (A) 4.0 - 5.6 %   HbA1c POC (<> result, manual entry)     HbA1c, POC (prediabetic range)     HbA1c, POC (controlled diabetic range)          Assessment & Plan:  Marland KitchenMarland KitchenLeon was seen today for follow-up.  Diagnoses and all orders for this visit:  Severe uncontrolled hypertension  Type 2 diabetes mellitus with stage 3a chronic kidney disease, without long-term current use of insulin (HCC) -     COMPLETE METABOLIC PANEL WITH GFR -     POCT glycosylated hemoglobin (Hb A1C) -     metFORMIN (GLUCOPHAGE) 1000 MG tablet; Take 1 tablet (1,000 mg total) by mouth 2 (two) times daily with a  meal.  Essential hypertension, benign -     COMPLETE METABOLIC PANEL WITH GFR -     valsartan (DIOVAN) 160 MG tablet; Take 1 tablet (160 mg total) by mouth daily. Needs  appointment.  COPD (chronic obstructive pulmonary disease) with chronic bronchitis (HCC) -     Fluticasone-Umeclidin-Vilant (TRELEGY ELLIPTA) 100-62.5-25 MCG/ACT AEPB; Inhale 1 puff into the lungs daily.  Mixed hyperlipidemia -     Lipid Panel w/reflex Direct LDL -     atorvastatin (LIPITOR) 20 MG tablet; Take 1 tablet (20 mg total) by mouth daily.  Medication management -     PSA -     Lipid Panel w/reflex Direct LDL -     COMPLETE METABOLIC PANEL WITH GFR -     CBC with Differential/Platelet -     POCT glycosylated hemoglobin (Hb A1C)  Screening PSA (prostate specific antigen) -     PSA  Anxiety state -     busPIRone (BUSPAR) 15 MG tablet; Take 1 tablet (15 mg total) by mouth 3 (three) times daily. -     ALPRAZolam (XANAX) 0.5 MG tablet; TAKE 1 TABLET(0.5 MG) BY MOUTH AT BEDTIME AS NEEDED FOR ANXIETY  PAF (paroxysmal atrial fibrillation) (HCC) -     metoprolol succinate (TOPROL-XL) 100 MG 24 hr tablet; Take 1 tablet (100 mg total) by mouth daily. -     XARELTO 20 MG TABS tablet; Take 1 tablet (20 mg total) by mouth daily.  Recurrent major depressive disorder, in partial remission (HCC) -     vortioxetine HBr (TRINTELLIX) 20 MG TABS tablet; Take 1 tablet (20 mg total) by mouth daily. -     ARIPiprazole (ABILIFY) 2 MG tablet; Take 1 tablet (2 mg total) by mouth daily.   A1c is to goal Continue metformin Blood pressure is most concerning with this elevation.  Patient has not been with medication.  Discussed the importance of staying on medication.  We will recheck in 2 weeks Refilled statin Needs foot exam and eye exam. COVID-vaccine x4 Flu/Shingrix/pneumonia up-to-date Follow-up in 3 months for next A1c check  Mood medications restarted.  Discussed risk of stopping anticoagulant if he goes into an  abnormal rhythm. Pt aware.

## 2021-07-20 LAB — COMPLETE METABOLIC PANEL WITH GFR
AG Ratio: 1.3 (calc) (ref 1.0–2.5)
ALT: 10 U/L (ref 9–46)
AST: 14 U/L (ref 10–35)
Albumin: 4 g/dL (ref 3.6–5.1)
Alkaline phosphatase (APISO): 52 U/L (ref 35–144)
BUN/Creatinine Ratio: 13 (calc) (ref 6–22)
BUN: 20 mg/dL (ref 7–25)
CO2: 27 mmol/L (ref 20–32)
Calcium: 8.6 mg/dL (ref 8.6–10.3)
Chloride: 106 mmol/L (ref 98–110)
Creat: 1.51 mg/dL — ABNORMAL HIGH (ref 0.70–1.28)
Globulin: 3.1 g/dL (calc) (ref 1.9–3.7)
Glucose, Bld: 106 mg/dL — ABNORMAL HIGH (ref 65–99)
Potassium: 4.2 mmol/L (ref 3.5–5.3)
Sodium: 142 mmol/L (ref 135–146)
Total Bilirubin: 0.4 mg/dL (ref 0.2–1.2)
Total Protein: 7.1 g/dL (ref 6.1–8.1)
eGFR: 47 mL/min/{1.73_m2} — ABNORMAL LOW (ref >=60)

## 2021-07-20 LAB — PSA: PSA: 3.38 ng/mL (ref <=4.00)

## 2021-07-20 LAB — CBC WITH DIFFERENTIAL/PLATELET
Absolute Monocytes: 588 cells/uL (ref 200–950)
Basophils Absolute: 42 cells/uL (ref 0–200)
Basophils Relative: 0.6 %
Eosinophils Absolute: 112 cells/uL (ref 15–500)
Eosinophils Relative: 1.6 %
HCT: 48.3 % (ref 38.5–50.0)
Hemoglobin: 16 g/dL (ref 13.2–17.1)
Lymphs Abs: 2436 cells/uL (ref 850–3900)
MCH: 31.7 pg (ref 27.0–33.0)
MCHC: 33.1 g/dL (ref 32.0–36.0)
MCV: 95.8 fL (ref 80.0–100.0)
MPV: 11.5 fL (ref 7.5–12.5)
Monocytes Relative: 8.4 %
Neutro Abs: 3822 cells/uL (ref 1500–7800)
Neutrophils Relative %: 54.6 %
Platelets: 180 10*3/uL (ref 140–400)
RBC: 5.04 10*6/uL (ref 4.20–5.80)
RDW: 13.2 % (ref 11.0–15.0)
Total Lymphocyte: 34.8 %
WBC: 7 10*3/uL (ref 3.8–10.8)

## 2021-07-20 LAB — LIPID PANEL W/REFLEX DIRECT LDL
Cholesterol: 172 mg/dL (ref <200)
HDL: 46 mg/dL (ref >=40)
LDL Cholesterol (Calc): 89 mg/dL (calc)
Non-HDL Cholesterol (Calc): 126 mg/dL (calc) (ref <130)
Total CHOL/HDL Ratio: 3.7 (calc) (ref <5.0)
Triglycerides: 284 mg/dL — ABNORMAL HIGH (ref <150)

## 2021-07-20 NOTE — Progress Notes (Signed)
Normal hemoglobin.  ?WBC looks great.  ?PSA normal range but upper limits of normal.  ? ?Confirm patient was fasting?  ?LDL and HDL look great.  ?TG elevated. Are you taking fish oil?  ? ?Glucose up some. Nurse will add A1C.  ? ?Liver looks good.  ? ?Kidney function declined. Need to keep BP under control. No ibuprofen, aleve, motrin. Drink plenty of water. Recheck in 2 weeks.  ? ?

## 2021-07-21 ENCOUNTER — Encounter: Payer: Self-pay | Admitting: Physician Assistant

## 2021-08-02 ENCOUNTER — Ambulatory Visit: Payer: Medicare HMO | Admitting: Physician Assistant

## 2021-08-24 ENCOUNTER — Ambulatory Visit (INDEPENDENT_AMBULATORY_CARE_PROVIDER_SITE_OTHER): Payer: Medicare HMO | Admitting: Physician Assistant

## 2021-08-24 VITALS — BP 152/88 | HR 83 | Ht 70.5 in | Wt 236.0 lb

## 2021-08-24 DIAGNOSIS — N529 Male erectile dysfunction, unspecified: Secondary | ICD-10-CM | POA: Diagnosis not present

## 2021-08-24 DIAGNOSIS — I10 Essential (primary) hypertension: Secondary | ICD-10-CM

## 2021-08-24 MED ORDER — SILDENAFIL CITRATE 20 MG PO TABS: ORAL_TABLET | ORAL | 5 refills | Status: DC

## 2021-08-24 MED ORDER — VALSARTAN 320 MG PO TABS: 320.0000 mg | ORAL_TABLET | Freq: Every day | ORAL | 0 refills | Status: DC

## 2021-08-24 NOTE — Progress Notes (Signed)
? ?Subjective:  ? ? Patient ID: Raymond Hardin, male    DOB: August 13, 1940, 81 y.o.   MRN: [id number] ? ?HPI ?Pt is a 81 yo male with HTN who presents to the clinic to follow up on BP. He is not checking BP at home. He denies any CP, palpitations, headaches or vision changes. He is taking his medication. He wants his viagra refilled as well.  ? ?.. ?Active Ambulatory Problems  ?  Diagnosis Date Noted  ? Erectile dysfunction 11/04/2011  ? Tobacco abuse 12/12/2012  ? Depression 12/12/2012  ? Primary osteoarthritis of right knee 04/24/2013  ? Vitamin D deficiency 08/13/2013  ? Pre-diabetes 08/16/2013  ? Status post left knee replacement 08/13/2014  ? Anxiety state 08/13/2014  ? Essential hypertension, benign 08/13/2014  ? Hyperlipidemia 02/24/2015  ? Hypertriglyceridemia 02/24/2015  ? CKD (chronic kidney disease), stage III ([place]) 12/07/2015  ? S/P total knee replacement, right 01/31/2017  ? Bilateral cataracts 01/05/2018  ? Posterior vitreous detachment of left eye 04/09/2018  ? 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 S/P bilateral cataract extraction 05/25/2018  ? Recurrent major depressive disorder, in partial remission ([place]) 06/19/2018  ? Skipped heart beats 09/17/2018  ? Strong pulse 09/17/2018  ? Palpitation 09/19/2018  ? Acute bilateral low back pain without sciatica 01/28/2019  ? PAF (paroxysmal atrial fibrillation) ([place]) 05/13/2019  ? Low blood potassium 05/13/2019  ? Acute encephalopathy 05/16/2019  ? COPD (chronic obstructive pulmonary disease) with chronic bronchitis ([place]) 05/16/2019  ? Hypotension 05/29/2019  ? History of COVID-19 06/11/2019  ? Mucopurulent chronic bronchitis ([place]) 06/11/2019  ? SOB (shortness of breath) on exertion 10/02/2019  ? Cough 11/04/2019  ? Type 2 diabetes mellitus with stage 3a chronic kidney disease, without long-term current use of insulin ([place]) 09/15/2020  ? ?Resolved Ambulatory Problems  ?  Diagnosis Date Noted  ? Hypertension, essential, benign 11/04/2011  ? Pneumonia of both lungs due  to infectious organism 05/16/2019  ? ?Past Medical History:  ?Diagnosis Date  ? COPD (chronic obstructive pulmonary disease) ([place])   ? COVID-19 03/2019  ? Diabetes mellitus without complication ([place])   ? Hypertension   ? Insomnia 2005  ? Osteoarthritis 2000  ? Paroxysmal A-fib ([place])   ? Pneumonia   ? ? ? ? ?Review of Systems  ?All other systems reviewed and are negative. ? ?   ?Objective:  ? Physical Exam ?Vitals reviewed.  ?Constitutional:   ?   Appearance: Normal appearance. He is obese.  ?HENT:  ?   Head: Normocephalic.  ?Cardiovascular:  ?   Rate and Rhythm: Normal rate and regular rhythm.  ?   Pulses: Normal pulses.  ?   Heart sounds: Normal heart sounds.  ?Pulmonary:  ?   Effort: Pulmonary effort is normal.  ?Musculoskeletal:  ?   Right lower leg: No edema.  ?   Left lower leg: No edema.  ?Neurological:  ?   General: No focal deficit present.  ?   Mental Status: He is alert and oriented to person, place, and time.  ?Psychiatric:     ?   Mood and Affect: Mood normal.  ? ? ? ? ? ?   ?Assessment & Plan:  ? ?Marland KitchenLynnae Sandhoff was seen today for follow-up and hypertension. ? ?Diagnoses and all orders for this visit: ? ?Essential hypertension, benign ?-     valsartan (DIOVAN) 320 MG tablet; Take 1 tablet (320 mg total) by mouth daily. ? ?Erectile dysfunction, unspecified erectile dysfunction type ?-     sildenafil (REVATIO) 20  MG tablet; TAKE 5 TABLETS BY MOUTH AS NEEDED FOR ERECTILE DYSFUNCTION ? ? ?BP improved but not to goal with risk factors.  ?Increased diovan to '320mg'$   ?Discussed change with patient ?Refilled revatio ?Follow up in 1 month ?

## 2021-08-24 NOTE — Patient Instructions (Signed)
Increased diovan to '320mg'$  daily. Stop the '160mg'$  daily.  ?

## 2021-08-27 ENCOUNTER — Encounter: Payer: Self-pay | Admitting: Physician Assistant

## 2021-09-21 ENCOUNTER — Ambulatory Visit: Payer: Medicare HMO | Admitting: Physician Assistant

## 2021-10-01 ENCOUNTER — Telehealth: Payer: Self-pay

## 2021-10-01 NOTE — Telephone Encounter (Signed)
Initiated Prior authorization [id number] (REVATIO) 20 MG  Via: Covermymeds Case/Key:[id number] Status: denied as of 10/01/21 Reason:The Medicare rule in the Prescription Drug Benefit Manual (Chapter 6, Section 20.1) says drugs used for the treatment of erectile dysfunction (ED) are excluded from Part D coverage. Your drug is prescribed for ED. Per Medicare rules, it is not covered Notified Pt via: pt does not have Mychart,Called to inform pt, pt acknowledged understanding

## 2021-11-29 ENCOUNTER — Other Ambulatory Visit: Payer: Self-pay | Admitting: Physician Assistant

## 2021-11-29 DIAGNOSIS — I10 Essential (primary) hypertension: Secondary | ICD-10-CM

## 2021-12-25 DIAGNOSIS — S40012A Contusion of left shoulder, initial encounter: Secondary | ICD-10-CM | POA: Diagnosis not present

## 2021-12-25 DIAGNOSIS — F32A Depression, unspecified: Secondary | ICD-10-CM | POA: Diagnosis not present

## 2021-12-25 DIAGNOSIS — M25512 Pain in left shoulder: Secondary | ICD-10-CM | POA: Diagnosis not present

## 2021-12-25 DIAGNOSIS — M1990 Unspecified osteoarthritis, unspecified site: Secondary | ICD-10-CM | POA: Diagnosis not present

## 2021-12-25 DIAGNOSIS — M7989 Other specified soft tissue disorders: Secondary | ICD-10-CM | POA: Diagnosis not present

## 2021-12-25 DIAGNOSIS — M25519 Pain in unspecified shoulder: Secondary | ICD-10-CM | POA: Diagnosis not present

## 2021-12-25 DIAGNOSIS — M19012 Primary osteoarthritis, left shoulder: Secondary | ICD-10-CM | POA: Diagnosis not present

## 2021-12-25 DIAGNOSIS — E785 Hyperlipidemia, unspecified: Secondary | ICD-10-CM | POA: Diagnosis not present

## 2021-12-25 DIAGNOSIS — Z791 Long term (current) use of non-steroidal anti-inflammatories (NSAID): Secondary | ICD-10-CM | POA: Diagnosis not present

## 2021-12-25 DIAGNOSIS — I10 Essential (primary) hypertension: Secondary | ICD-10-CM | POA: Diagnosis not present

## 2021-12-25 DIAGNOSIS — Z96653 Presence of artificial knee joint, bilateral: Secondary | ICD-10-CM | POA: Diagnosis not present

## 2021-12-25 DIAGNOSIS — Z96612 Presence of left artificial shoulder joint: Secondary | ICD-10-CM | POA: Diagnosis not present

## 2021-12-25 DIAGNOSIS — Z87891 Personal history of nicotine dependence: Secondary | ICD-10-CM | POA: Diagnosis not present

## 2021-12-25 DIAGNOSIS — W19XXXA Unspecified fall, initial encounter: Secondary | ICD-10-CM | POA: Diagnosis not present

## 2021-12-25 DIAGNOSIS — M778 Other enthesopathies, not elsewhere classified: Secondary | ICD-10-CM | POA: Diagnosis not present

## 2021-12-26 DIAGNOSIS — M778 Other enthesopathies, not elsewhere classified: Secondary | ICD-10-CM | POA: Diagnosis not present

## 2021-12-26 DIAGNOSIS — M7989 Other specified soft tissue disorders: Secondary | ICD-10-CM | POA: Diagnosis not present

## 2021-12-26 DIAGNOSIS — M19012 Primary osteoarthritis, left shoulder: Secondary | ICD-10-CM | POA: Diagnosis not present

## 2022-02-24 ENCOUNTER — Other Ambulatory Visit: Payer: Self-pay | Admitting: Neurology

## 2022-02-24 DIAGNOSIS — F411 Generalized anxiety disorder: Secondary | ICD-10-CM

## 2022-02-24 NOTE — Telephone Encounter (Signed)
Patient requesting a refill of Xanax. Last seen in April. He is scheduling follow up. Last written 07/19/2021 #30 with 5 refills.

## 2022-02-25 MED ORDER — ALPRAZOLAM 0.5 MG PO TABS: ORAL_TABLET | ORAL | 0 refills | Status: DC

## 2022-03-14 ENCOUNTER — Ambulatory Visit: Payer: Medicare HMO | Admitting: Physician Assistant

## 2022-03-14 DIAGNOSIS — N1831 Chronic kidney disease, stage 3a: Secondary | ICD-10-CM

## 2022-03-22 ENCOUNTER — Ambulatory Visit: Payer: Medicare HMO | Admitting: Physician Assistant

## 2022-03-25 ENCOUNTER — Ambulatory Visit: Payer: Medicare HMO | Admitting: Physician Assistant

## 2022-03-25 DIAGNOSIS — I10 Essential (primary) hypertension: Secondary | ICD-10-CM

## 2022-03-25 DIAGNOSIS — N1831 Chronic kidney disease, stage 3a: Secondary | ICD-10-CM

## 2022-04-11 ENCOUNTER — Telehealth: Payer: Self-pay | Admitting: Neurology

## 2022-04-11 NOTE — Telephone Encounter (Signed)
Last appt 08/24/2021  Xanax last written 02/25/2022 #15 no refills  Patient needs appt for further refills. Has cancelled/no showed several appts. Please call to schedule follow up.

## 2022-04-13 DIAGNOSIS — E1122 Type 2 diabetes mellitus with diabetic chronic kidney disease: Secondary | ICD-10-CM | POA: Diagnosis not present

## 2022-04-13 DIAGNOSIS — I129 Hypertensive chronic kidney disease with stage 1 through stage 4 chronic kidney disease, or unspecified chronic kidney disease: Secondary | ICD-10-CM | POA: Diagnosis not present

## 2022-04-13 DIAGNOSIS — R4182 Altered mental status, unspecified: Secondary | ICD-10-CM | POA: Diagnosis not present

## 2022-04-13 DIAGNOSIS — J189 Pneumonia, unspecified organism: Secondary | ICD-10-CM | POA: Diagnosis not present

## 2022-04-13 DIAGNOSIS — W19XXXA Unspecified fall, initial encounter: Secondary | ICD-10-CM | POA: Diagnosis not present

## 2022-04-13 DIAGNOSIS — Z8659 Personal history of other mental and behavioral disorders: Secondary | ICD-10-CM | POA: Diagnosis not present

## 2022-04-13 DIAGNOSIS — M549 Dorsalgia, unspecified: Secondary | ICD-10-CM | POA: Diagnosis not present

## 2022-04-13 DIAGNOSIS — E119 Type 2 diabetes mellitus without complications: Secondary | ICD-10-CM | POA: Diagnosis not present

## 2022-04-13 DIAGNOSIS — N183 Chronic kidney disease, stage 3 unspecified: Secondary | ICD-10-CM | POA: Diagnosis not present

## 2022-04-13 DIAGNOSIS — N1832 Chronic kidney disease, stage 3b: Secondary | ICD-10-CM | POA: Diagnosis not present

## 2022-04-13 DIAGNOSIS — J441 Chronic obstructive pulmonary disease with (acute) exacerbation: Secondary | ICD-10-CM | POA: Diagnosis not present

## 2022-04-13 DIAGNOSIS — D696 Thrombocytopenia, unspecified: Secondary | ICD-10-CM | POA: Diagnosis not present

## 2022-04-13 DIAGNOSIS — R296 Repeated falls: Secondary | ICD-10-CM | POA: Diagnosis not present

## 2022-04-13 DIAGNOSIS — R112 Nausea with vomiting, unspecified: Secondary | ICD-10-CM | POA: Diagnosis not present

## 2022-04-13 DIAGNOSIS — R509 Fever, unspecified: Secondary | ICD-10-CM | POA: Diagnosis not present

## 2022-04-13 DIAGNOSIS — I480 Paroxysmal atrial fibrillation: Secondary | ICD-10-CM | POA: Diagnosis not present

## 2022-04-13 DIAGNOSIS — Z720 Tobacco use: Secondary | ICD-10-CM | POA: Diagnosis not present

## 2022-04-13 DIAGNOSIS — Z20822 Contact with and (suspected) exposure to covid-19: Secondary | ICD-10-CM | POA: Diagnosis not present

## 2022-04-13 DIAGNOSIS — I517 Cardiomegaly: Secondary | ICD-10-CM | POA: Diagnosis not present

## 2022-04-13 DIAGNOSIS — I2510 Atherosclerotic heart disease of native coronary artery without angina pectoris: Secondary | ICD-10-CM | POA: Diagnosis not present

## 2022-04-13 DIAGNOSIS — R410 Disorientation, unspecified: Secondary | ICD-10-CM | POA: Diagnosis not present

## 2022-04-13 DIAGNOSIS — E785 Hyperlipidemia, unspecified: Secondary | ICD-10-CM | POA: Diagnosis not present

## 2022-04-13 DIAGNOSIS — F32A Depression, unspecified: Secondary | ICD-10-CM | POA: Diagnosis not present

## 2022-04-13 DIAGNOSIS — I10 Essential (primary) hypertension: Secondary | ICD-10-CM | POA: Diagnosis not present

## 2022-04-13 DIAGNOSIS — K449 Diaphragmatic hernia without obstruction or gangrene: Secondary | ICD-10-CM | POA: Diagnosis not present

## 2022-04-13 DIAGNOSIS — R0609 Other forms of dyspnea: Secondary | ICD-10-CM | POA: Diagnosis not present

## 2022-04-13 DIAGNOSIS — R918 Other nonspecific abnormal finding of lung field: Secondary | ICD-10-CM | POA: Diagnosis not present

## 2022-04-14 DIAGNOSIS — D696 Thrombocytopenia, unspecified: Secondary | ICD-10-CM | POA: Diagnosis not present

## 2022-04-14 DIAGNOSIS — Z8659 Personal history of other mental and behavioral disorders: Secondary | ICD-10-CM | POA: Diagnosis not present

## 2022-04-14 DIAGNOSIS — W19XXXA Unspecified fall, initial encounter: Secondary | ICD-10-CM | POA: Diagnosis not present

## 2022-04-14 DIAGNOSIS — N1832 Chronic kidney disease, stage 3b: Secondary | ICD-10-CM | POA: Diagnosis not present

## 2022-04-14 DIAGNOSIS — I480 Paroxysmal atrial fibrillation: Secondary | ICD-10-CM | POA: Diagnosis not present

## 2022-04-14 DIAGNOSIS — J441 Chronic obstructive pulmonary disease with (acute) exacerbation: Secondary | ICD-10-CM | POA: Diagnosis not present

## 2022-04-14 DIAGNOSIS — E119 Type 2 diabetes mellitus without complications: Secondary | ICD-10-CM | POA: Diagnosis not present

## 2022-04-14 DIAGNOSIS — I10 Essential (primary) hypertension: Secondary | ICD-10-CM | POA: Diagnosis not present

## 2022-04-14 DIAGNOSIS — E785 Hyperlipidemia, unspecified: Secondary | ICD-10-CM | POA: Diagnosis not present

## 2022-04-15 DIAGNOSIS — N1832 Chronic kidney disease, stage 3b: Secondary | ICD-10-CM | POA: Diagnosis not present

## 2022-04-15 DIAGNOSIS — Z8659 Personal history of other mental and behavioral disorders: Secondary | ICD-10-CM | POA: Diagnosis not present

## 2022-04-15 DIAGNOSIS — W19XXXA Unspecified fall, initial encounter: Secondary | ICD-10-CM | POA: Diagnosis not present

## 2022-04-15 DIAGNOSIS — E119 Type 2 diabetes mellitus without complications: Secondary | ICD-10-CM | POA: Diagnosis not present

## 2022-04-15 DIAGNOSIS — E785 Hyperlipidemia, unspecified: Secondary | ICD-10-CM | POA: Diagnosis not present

## 2022-04-15 DIAGNOSIS — I480 Paroxysmal atrial fibrillation: Secondary | ICD-10-CM | POA: Diagnosis not present

## 2022-04-15 DIAGNOSIS — D696 Thrombocytopenia, unspecified: Secondary | ICD-10-CM | POA: Diagnosis not present

## 2022-04-15 DIAGNOSIS — I10 Essential (primary) hypertension: Secondary | ICD-10-CM | POA: Diagnosis not present

## 2022-04-15 DIAGNOSIS — J441 Chronic obstructive pulmonary disease with (acute) exacerbation: Secondary | ICD-10-CM | POA: Diagnosis not present

## 2022-04-27 ENCOUNTER — Encounter: Payer: Self-pay | Admitting: Physician Assistant

## 2022-04-27 ENCOUNTER — Ambulatory Visit (INDEPENDENT_AMBULATORY_CARE_PROVIDER_SITE_OTHER): Payer: Medicare HMO | Admitting: Physician Assistant

## 2022-04-27 VITALS — BP 154/82 | HR 70 | Ht 70.0 in | Wt 235.0 lb

## 2022-04-27 DIAGNOSIS — F411 Generalized anxiety disorder: Secondary | ICD-10-CM

## 2022-04-27 DIAGNOSIS — N1831 Chronic kidney disease, stage 3a: Secondary | ICD-10-CM

## 2022-04-27 DIAGNOSIS — B351 Tinea unguium: Secondary | ICD-10-CM | POA: Insufficient documentation

## 2022-04-27 DIAGNOSIS — J4489 Other specified chronic obstructive pulmonary disease: Secondary | ICD-10-CM

## 2022-04-27 DIAGNOSIS — J209 Acute bronchitis, unspecified: Secondary | ICD-10-CM | POA: Diagnosis not present

## 2022-04-27 DIAGNOSIS — D696 Thrombocytopenia, unspecified: Secondary | ICD-10-CM | POA: Diagnosis not present

## 2022-04-27 DIAGNOSIS — J440 Chronic obstructive pulmonary disease with acute lower respiratory infection: Secondary | ICD-10-CM

## 2022-04-27 DIAGNOSIS — E1122 Type 2 diabetes mellitus with diabetic chronic kidney disease: Secondary | ICD-10-CM

## 2022-04-27 DIAGNOSIS — G63 Polyneuropathy in diseases classified elsewhere: Secondary | ICD-10-CM

## 2022-04-27 LAB — POCT GLYCOSYLATED HEMOGLOBIN (HGB A1C): Hemoglobin A1C: 7 % — AB (ref 4.0–5.6)

## 2022-04-27 LAB — POCT UA - MICROALBUMIN
Creatinine, POC: 300 mg/dL
Microalbumin Ur, POC: 150 mg/L

## 2022-04-27 MED ORDER — DAPAGLIFLOZIN PROPANEDIOL 10 MG PO TABS: 10.0000 mg | ORAL_TABLET | Freq: Every day | ORAL | 0 refills | Status: DC

## 2022-04-27 MED ORDER — ALPRAZOLAM 0.5 MG PO TABS: ORAL_TABLET | ORAL | 2 refills | Status: DC

## 2022-04-27 MED ORDER — TERBINAFINE HCL 250 MG PO TABS: 250.0000 mg | ORAL_TABLET | Freq: Every day | ORAL | 0 refills | Status: AC

## 2022-04-27 NOTE — Progress Notes (Signed)
Established Patient Office Visit  Subjective   Patient ID: Raymond Hardin, male    DOB: March 11, 1941  Age: 81 y.o. MRN: [id number]  Chief Complaint  Patient presents with   Follow-up    copd    HPI Pt is a 81 yo male with COPD, HTN, T2DM, HLD, PAF, CKD who presents to the clinic to follow up after ED visit on 12/6 and admission for COPD exacerbation. Pt was discharged on 12/8. He was given IV solumedrol and nebulizer treatments. He was sent home on prednisone and has finished course. Some thrombocytopenia with no other concerns. CT negative for pneumonia. Pt continues to smoke. Pt is feeling better and continues on Trelegy.   He reports to be taking his metformin daily. He is not checking his sugars. He is not watching his diet.   Pt reports taking xanax about every other day and request refills.  .. Active Ambulatory Problems    Diagnosis Date Noted   Erectile dysfunction 11/04/2011   Tobacco abuse 12/12/2012   Depression 12/12/2012   Primary osteoarthritis of right knee 04/24/2013   Vitamin D deficiency 08/13/2013   Pre-diabetes 08/16/2013   Status post left knee replacement 08/13/2014   Anxiety state 08/13/2014   Essential hypertension, benign 08/13/2014   Hyperlipidemia 02/24/2015   Hypertriglyceridemia 02/24/2015   CKD (chronic kidney disease), stage III ([place]) 12/07/2015   S/P total knee replacement, right 01/31/2017   Bilateral cataracts 01/05/2018   Posterior vitreous detachment of left eye 04/09/2018   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   S/P bilateral cataract extraction 05/25/2018   Recurrent major depressive disorder, in partial remission ([place]) 06/19/2018   Skipped heart beats 09/17/2018   Strong pulse 09/17/2018   Palpitation 09/19/2018   Acute bilateral low back pain without sciatica 01/28/2019   PAF (paroxysmal atrial fibrillation) ([place]) 05/13/2019   Low blood potassium 05/13/2019   Acute encephalopathy 05/16/2019   COPD (chronic obstructive pulmonary disease)  with chronic bronchitis 05/16/2019   Hypotension 05/29/2019   History of COVID-19 06/11/2019   Mucopurulent chronic bronchitis ([place]) 06/11/2019   SOB (shortness of breath) on exertion 10/02/2019   Cough 11/04/2019   Type 2 diabetes mellitus with stage 3a chronic kidney disease, without long-term current use of insulin ([place]) 09/15/2020   Toenail fungus 04/27/2022   Polyneuropathy associated with underlying disease ([place]) 04/27/2022   Resolved Ambulatory Problems    Diagnosis Date Noted   Hypertension, essential, benign 11/04/2011   Pneumonia of both lungs due to infectious organism 05/16/2019   Past Medical History:  Diagnosis Date   COPD (chronic obstructive pulmonary disease) ([place])    COVID-19 03/2019   Diabetes mellitus without complication ([place])    Hypertension    Insomnia 2005   Osteoarthritis 2000   Paroxysmal A-fib ([place])    Pneumonia     ROS See HPI.    Objective:     BP (!) 154/82   Pulse 70   Ht '5\' 10"'$  (1.778 m)   Wt 235 lb 0.6 oz (106.6 kg)   SpO2 97%   BMI 33.72 kg/m  BP Readings from Last 3 Encounters:  04/27/22 (!) 154/82  08/24/21 (!) 152/88  07/19/21 (!) 190/100   Wt Readings from Last 3 Encounters:  04/27/22 235 lb 0.6 oz (106.6 kg)  08/24/21 236 lb (107 kg)  07/19/21 235 lb (106.6 kg)   .Marland Kitchen Results for orders placed or performed in visit on 04/27/22  POCT glycosylated hemoglobin (Hb A1C)  Result Value Ref Range   Hemoglobin A1C  7.0 (A) 4.0 - 5.6 %   HbA1c POC (<> result, manual entry)     HbA1c, POC (prediabetic range)     HbA1c, POC (controlled diabetic range)    POCT UA - Microalbumin  Result Value Ref Range   Microalbumin Ur, POC 150 mg/L   Creatinine, POC 300 mg/dL   Albumin/Creatinine Ratio, Urine, POC 30-300    .Marland Kitchen Diabetic Foot Exam - Simple   Simple Foot Form Diabetic Foot exam was performed with the following findings: Yes 04/27/2022 10:29 AM  Visual Inspection No deformities, no ulcerations, no other skin breakdown  bilaterally: Yes Sensation Testing Intact to touch and monofilament testing bilaterally: Yes See comments: Yes Pulse Check Posterior Tibialis and Dorsalis pulse intact bilaterally: Yes Comments Patient unable to feel between big toe and little toes on both feets    Bilateral toenails thick and dystrophic.   Physical Exam Constitutional:      Appearance: Normal appearance. He is obese.  HENT:     Head: Normocephalic.  Cardiovascular:     Rate and Rhythm: Normal rate and regular rhythm.  Pulmonary:     Effort: Pulmonary effort is normal.     Breath sounds: No wheezing.     Comments: Coarse breath sounds bilateral lungs Musculoskeletal:     Right lower leg: No edema.     Left lower leg: No edema.  Lymphadenopathy:     Cervical: No cervical adenopathy.  Neurological:     General: No focal deficit present.     Mental Status: He is alert and oriented to person, place, and time.          Assessment & Plan:  Marland KitchenMarland KitchenEngelbert was seen today for follow-up.  Diagnoses and all orders for this visit:  COPD (chronic obstructive pulmonary disease) with chronic bronchitis  Type 2 diabetes mellitus with stage 3a chronic kidney disease, without long-term current use of insulin (HCC) -     POCT glycosylated hemoglobin (Hb A1C)  Toenail fungus -     terbinafine (LAMISIL) 250 MG tablet; Take 1 tablet (250 mg total) by mouth daily. -     POCT UA - Microalbumin  Anxiety state -     ALPRAZolam (XANAX) 0.5 MG tablet; TAKE 1 TABLET(0.5 MG) BY MOUTH AT BEDTIME AS NEEDED FOR ANXIETY  Polyneuropathy associated with underlying disease (HCC)  Thrombocytopenia (HCC) -     CBC w/Diff/Platelet   BP elevated today but had just taken medication  Went down on 2nd recheck Pulse ox looks good Reminded to take trelegy every day Encouraged to stop smoking  A1C elevated  Continue on metformin for now due to unsure what preferred drug in the new year On statin BP not to goal Repeat in 4 weeks  nurse visit Neuropathy Microalbumin positive-suggested SGLT-2 Needs eye exam Pt decline vaccines Follow up in 3 months  .Marland KitchenPDMP reviewed during this encounter. Last fill 02/2022 Refilled for as needed usage today  Cbc ordered for follow up on thrombocytopenia in ED.  Toenail fungus-lamisil started last liver enzymes looked good Recheck in 3 months    Iran Planas, PA-C

## 2022-04-27 NOTE — Patient Instructions (Signed)
Start lamisil for toenail fungus

## 2022-04-27 NOTE — Addendum Note (Signed)
Addended by: Donella Stade on: 04/27/2022 01:56 PM   Modules accepted: Orders

## 2022-04-27 NOTE — Progress Notes (Signed)
You do have some protein in urine that can be the first sign of kidney damage to come. I would like to add medication that can help prevent kidney function decline. Are you ok with this?

## 2022-05-06 DIAGNOSIS — Z6832 Body mass index (BMI) 32.0-32.9, adult: Secondary | ICD-10-CM | POA: Diagnosis not present

## 2022-05-06 DIAGNOSIS — N1831 Chronic kidney disease, stage 3a: Secondary | ICD-10-CM | POA: Diagnosis not present

## 2022-05-06 DIAGNOSIS — R918 Other nonspecific abnormal finding of lung field: Secondary | ICD-10-CM | POA: Diagnosis not present

## 2022-05-06 DIAGNOSIS — J101 Influenza due to other identified influenza virus with other respiratory manifestations: Secondary | ICD-10-CM | POA: Diagnosis not present

## 2022-05-06 DIAGNOSIS — E1122 Type 2 diabetes mellitus with diabetic chronic kidney disease: Secondary | ICD-10-CM | POA: Diagnosis not present

## 2022-05-06 DIAGNOSIS — Z794 Long term (current) use of insulin: Secondary | ICD-10-CM | POA: Diagnosis not present

## 2022-05-06 DIAGNOSIS — K449 Diaphragmatic hernia without obstruction or gangrene: Secondary | ICD-10-CM | POA: Diagnosis not present

## 2022-05-06 DIAGNOSIS — J9601 Acute respiratory failure with hypoxia: Secondary | ICD-10-CM | POA: Diagnosis not present

## 2022-05-06 DIAGNOSIS — I480 Paroxysmal atrial fibrillation: Secondary | ICD-10-CM | POA: Diagnosis not present

## 2022-05-06 DIAGNOSIS — J441 Chronic obstructive pulmonary disease with (acute) exacerbation: Secondary | ICD-10-CM | POA: Diagnosis not present

## 2022-05-06 DIAGNOSIS — F32A Depression, unspecified: Secondary | ICD-10-CM | POA: Diagnosis not present

## 2022-05-06 DIAGNOSIS — I10 Essential (primary) hypertension: Secondary | ICD-10-CM | POA: Diagnosis not present

## 2022-05-06 DIAGNOSIS — I129 Hypertensive chronic kidney disease with stage 1 through stage 4 chronic kidney disease, or unspecified chronic kidney disease: Secondary | ICD-10-CM | POA: Diagnosis not present

## 2022-05-06 DIAGNOSIS — N179 Acute kidney failure, unspecified: Secondary | ICD-10-CM | POA: Diagnosis not present

## 2022-05-06 DIAGNOSIS — R0602 Shortness of breath: Secondary | ICD-10-CM | POA: Diagnosis not present

## 2022-05-06 DIAGNOSIS — J9621 Acute and chronic respiratory failure with hypoxia: Secondary | ICD-10-CM | POA: Diagnosis not present

## 2022-05-06 DIAGNOSIS — D696 Thrombocytopenia, unspecified: Secondary | ICD-10-CM | POA: Diagnosis not present

## 2022-05-06 DIAGNOSIS — E668 Other obesity: Secondary | ICD-10-CM | POA: Diagnosis not present

## 2022-05-07 DIAGNOSIS — J441 Chronic obstructive pulmonary disease with (acute) exacerbation: Secondary | ICD-10-CM | POA: Diagnosis not present

## 2022-05-07 DIAGNOSIS — N1831 Chronic kidney disease, stage 3a: Secondary | ICD-10-CM | POA: Diagnosis not present

## 2022-05-07 DIAGNOSIS — I129 Hypertensive chronic kidney disease with stage 1 through stage 4 chronic kidney disease, or unspecified chronic kidney disease: Secondary | ICD-10-CM | POA: Diagnosis not present

## 2022-05-07 DIAGNOSIS — E1122 Type 2 diabetes mellitus with diabetic chronic kidney disease: Secondary | ICD-10-CM | POA: Diagnosis not present

## 2022-05-07 DIAGNOSIS — I480 Paroxysmal atrial fibrillation: Secondary | ICD-10-CM | POA: Diagnosis not present

## 2022-05-07 DIAGNOSIS — Z794 Long term (current) use of insulin: Secondary | ICD-10-CM | POA: Diagnosis not present

## 2022-05-07 DIAGNOSIS — J101 Influenza due to other identified influenza virus with other respiratory manifestations: Secondary | ICD-10-CM | POA: Diagnosis not present

## 2022-05-07 DIAGNOSIS — N179 Acute kidney failure, unspecified: Secondary | ICD-10-CM | POA: Diagnosis not present

## 2022-05-07 DIAGNOSIS — D696 Thrombocytopenia, unspecified: Secondary | ICD-10-CM | POA: Diagnosis not present

## 2022-05-08 DIAGNOSIS — N1831 Chronic kidney disease, stage 3a: Secondary | ICD-10-CM | POA: Diagnosis not present

## 2022-05-08 DIAGNOSIS — N179 Acute kidney failure, unspecified: Secondary | ICD-10-CM | POA: Diagnosis not present

## 2022-05-08 DIAGNOSIS — J441 Chronic obstructive pulmonary disease with (acute) exacerbation: Secondary | ICD-10-CM | POA: Diagnosis not present

## 2022-05-08 DIAGNOSIS — D696 Thrombocytopenia, unspecified: Secondary | ICD-10-CM | POA: Diagnosis not present

## 2022-05-08 DIAGNOSIS — I480 Paroxysmal atrial fibrillation: Secondary | ICD-10-CM | POA: Diagnosis not present

## 2022-05-08 DIAGNOSIS — I129 Hypertensive chronic kidney disease with stage 1 through stage 4 chronic kidney disease, or unspecified chronic kidney disease: Secondary | ICD-10-CM | POA: Diagnosis not present

## 2022-05-08 DIAGNOSIS — J101 Influenza due to other identified influenza virus with other respiratory manifestations: Secondary | ICD-10-CM | POA: Diagnosis not present

## 2022-05-08 DIAGNOSIS — Z794 Long term (current) use of insulin: Secondary | ICD-10-CM | POA: Diagnosis not present

## 2022-05-08 DIAGNOSIS — E1122 Type 2 diabetes mellitus with diabetic chronic kidney disease: Secondary | ICD-10-CM | POA: Diagnosis not present

## 2022-05-09 DIAGNOSIS — J441 Chronic obstructive pulmonary disease with (acute) exacerbation: Secondary | ICD-10-CM | POA: Diagnosis not present

## 2022-05-10 DIAGNOSIS — J441 Chronic obstructive pulmonary disease with (acute) exacerbation: Secondary | ICD-10-CM | POA: Diagnosis not present

## 2022-05-11 ENCOUNTER — Encounter: Payer: Self-pay | Admitting: *Deleted

## 2022-05-11 ENCOUNTER — Ambulatory Visit: Payer: Medicare HMO | Admitting: Physician Assistant

## 2022-05-11 ENCOUNTER — Telehealth: Payer: Self-pay | Admitting: *Deleted

## 2022-05-11 NOTE — Patient Outreach (Addendum)
  Care Coordination TOC Note Transition Care Management Unsuccessful Follow-up Telephone Call  Date of discharge and from where:  Tuesday, 05/10/22 [facility]; influenza A/ COPD exacerbation  Attempts:  1st Attempt  Reason for unsuccessful TCM follow-up call:  Unable to leave message   Attempt # 1- preferred number on file: Received automated outgoing voice message stating "the person you are trying to reach has a voice mailbox that has not been set up yet;" unable to leave voice message requesting call back  Attempt # 2- alternate number on file: Received automated outgoing message stating, "the number you are calling is no longer in service;" unable to leave voice message requestijng call-back   Oneta Rack, RN, BSN, CCRN Alumnus RN CM Care Coordination/ Transition of [facility] [phone]: direct office

## 2022-05-12 ENCOUNTER — Encounter: Payer: Self-pay | Admitting: *Deleted

## 2022-05-12 ENCOUNTER — Telehealth: Payer: Self-pay | Admitting: *Deleted

## 2022-05-12 DIAGNOSIS — J4489 Other specified chronic obstructive pulmonary disease: Secondary | ICD-10-CM

## 2022-05-12 NOTE — Patient Outreach (Addendum)
Care Coordination [facility] Note Transition Care Management Follow-up Telephone Call Date of discharge and from where: Tuesday, 05/10/22, [facility]; Acute--> Chronic respiratory failure; COPD exacerbation, influenza A How have you been since you were released from the hospital? "I think I am doing better.  I have not got my new medicines (prednisone/ tamiflu) yet-- I am going to have my friend pick them up today for me.  I have been talking with the social worker at the [place] about everything I need.  I can do just about everything I need to to take care of myself, but my friends are helping me as much as they can.  I am living in a motel right now, but the social worker is trying to help me find another place to live.  I have enough food for a little while, but I could run out any time.  I am using a cane and walker right now.  I need to hang up because my social worker is calling me right now" Any questions or concerns? Yes 1) Has not yet picked up post-hospital discharge medications, as above:  reports he will pick up from outpatient pharmacy today, friend will assist per patient report 2) Working with established Education officer, museum at [employer] for [employer] needs: reports he does not think the Education officer, museum is aware of his reported food insecurity: placed urgent [place] referral 3) No scheduled PCP appointment post-recent hospital discharge: sent request to scheduling care guide to facilitate prompt scheduling of same 4) Placed referral for ongoing outreach with RN CM Care Coordinator to follow up by telephone in one week on 05/19/22 5) Explained to patient interventions taken today and encouraged patient to keep his phone nearby and to take all calls that come to him, and to maintain contact with his established Education officer, museum at the [place] (see [facility] L[facility] note dated 05/11/22)  Items Reviewed: Did the pt receive and understand the discharge instructions provided?  Unable to  assess- patient received incoming call from his LCSW at the [place] during our conversation, did not want to miss the call  Medications obtained and verified?  Unable to complete- patient received incoming call from his LCSW at [place] during our conversation; patient did confirm that he has not medication concerns currently; reports adherence to taking all regularly prescribed medications/ self- manages medications, reports has all medications except for Tamiflu and prednisone, which he states he will pick up today Other? No  Any new allergies since your discharge? No  Dietary orders reviewed? Yes Do you have support at home? Yes  patient reports he is independent in self care; friends assist as needed/ indicated  Home Care and Equipment/Supplies: Were home health services ordered? no If so, what is the name of the agency? N/A  Has the agency set up a time to come to the patient's home? not applicable Were any new equipment or medical supplies ordered?  No What is the name of the medical supply agency? N/A Were you able to get the supplies/equipment? not applicable Do you have any questions related to the use of the equipment or supplies? No N/A  Functional Questionnaire: (I = Independent and D = Dependent) ADLs: I  Bathing/Dressing- I  Meal Prep- I  Eating- I  Maintaining continence- I  Transferring/Ambulation- I  Managing Meds- I  Follow up appointments reviewed:  PCP Hospital f/u appt confirmed? No  Scheduled to see - on - @ - sent request to scheduling care guide  to facilitate scheduling hospital follow up appointment Specialist Hospital f/u appt confirmed? No  Scheduled to see - on - @ - Are transportation arrangements needed? No  If their condition worsens, is the pt aware to call PCP or go to the Emergency Dept.? Yes Was the patient provided with contact information for the PCP's office or ED? No- patient reports he already has contact information for all care providers Was to pt  encouraged to call back with questions or concerns? Yes  SDOH assessments and interventions completed:   Yes SDOH Interventions Today    Flowsheet Row Most Recent Value  SDOH Interventions   Food Insecurity Interventions Other (Comment)  [REF [place] referral placed 05/12/22]  Transportation Interventions Intervention Not Indicated  [patient reports he has a friend who assists with transportation]      Care Coordination Interventions:  PCP follow up appointment requested Referred for Care Coordination Services:  RN Care Coordinator Provided education around need to promptly obtain newly prescribed post-hospital discharge medications; reviewed with patient his established/ ongoing relationship [facility] and confirmed he has direct phone numbers for LCSW at [facility]; placed [id number] for [employer] care guide to provide resources for food acquisition     Encounter Outcome:  Pt. Visit Completed    Oneta Rack, RN, BSN, CCRN Alumnus RN CM Care Coordination/ Transition of [place] Management [phone]: direct office

## 2022-05-12 NOTE — Telephone Encounter (Signed)
   Telephone encounter was:  Unsuccessful.  05/12/2022 Name: Raymond Hardin MRN: [id number] DOB: February 01, 1941  Unsuccessful outbound call made today to assist with:  Food Insecurity  Outreach Attempt:  1st Attempt  A HIPAA compliant voice message was left requesting a return call.  Instructed patient to call back at [phone]. [place] [phone] [address] , [place] [address] Email : [email or web address]

## 2022-05-12 NOTE — Progress Notes (Signed)
  Care Coordination  Note  05/12/2022 Name: [name] MRN: [id number] DOB: Jun 08, 1940  Raymond Hardin is a 82 y.o. year old primary care patient of Donella Stade, [place]. I reached out to Raymond Hardin by phone today to assist with scheduling a follow up appointment. Raymond Hardin verbally consented to my assistance.       Follow up plan: Hospital Follow Up appointment scheduled with Iran Planas, NP) on (05/18/2022) at (1110am).  Julian Hy, [place] Direct Dial: [phone]

## 2022-05-13 ENCOUNTER — Telehealth: Payer: Self-pay | Admitting: *Deleted

## 2022-05-13 NOTE — Telephone Encounter (Signed)
   Telephone encounter was:  Successful.  05/13/2022 Name: Raymond Hardin MRN: [id number] DOB: 1940/07/30  [name] is a 82 y.o. year old male who is a primary care patient of Donella Stade, [place] . The community resource team was consulted for assistance with Food Insecurity and homeless  Care guide performed the following interventions: Patient provided with information about care guide support team and interviewed to confirm resource needs.Sent application for food stamps and LIEAP as well as food banks and veterans assistance agencies through [place] [facility]   Follow Up Plan:  No further follow up planned at this time. The patient has been provided with needed resources.  [place] [phone] [address] , [place] [address] Email : Ashby [email or web address]

## 2022-05-18 ENCOUNTER — Inpatient Hospital Stay: Payer: Medicare HMO | Admitting: Physician Assistant

## 2022-05-19 ENCOUNTER — Ambulatory Visit: Payer: Self-pay

## 2022-05-19 ENCOUNTER — Telehealth: Payer: Self-pay | Admitting: *Deleted

## 2022-05-19 DIAGNOSIS — Z139 Encounter for screening, unspecified: Secondary | ICD-10-CM

## 2022-05-19 NOTE — Patient Outreach (Signed)
  Care Coordination   Initial Visit Note   05/19/2022 Name: Raymond Hardin MRN: [id number] DOB: 04/24/1941  Raymond Hardin is a 82 y.o. year old male who sees [place], Royetta Car, [place] for primary care. I spoke with  Raymond Hardin by phone today.  What matters to the patients health and wellness today?  Hospitalized 05/06/22-05/10/22 with acute on chronic respiratory failure with hypoxia. [name] reports improved.  He missed his appointment yesterday because the person he thought was going to take him was not able to. Patient currently residing in a [place]. He states the [place] worker is working to assist with finding him another place to stay. He also reports she has arranged for him to have a raised commode and life alert type device through the [place]. He is receptive to [facility] arranging another follow up appointment with PCP.   Goals Addressed             This Visit's Progress    Care Coordination Activities       Care Coordination Interventions: Discussed with patient status post hospitalization RNCM contacted provider and rescheduled patient for 05/24/22. Patient updated and instructed to contact [employer] insurance to schedule transportation today(3 days in advance). RNCM also place referral to care guide regarding transportation needs Collaboration with [facility] nurse Reginia Naas regarding patient needs Discussed OTC benefits available though insurance agency for things such as pulse oximter and other items. Encouraged patient to call the number on the back of his card for more detailed information regarding that benefit.      SDOH assessments and interventions completed:  Yes  SDOH Interventions Today    Flowsheet Row Most Recent Value  SDOH Interventions   Transportation Interventions Intervention Not Indicated     Care Coordination Interventions:  Yes, provided   Follow up plan: Follow up call scheduled for 05/30/22    Encounter Outcome:  Pt. Visit Completed   Thea Silversmith,  RN, MSN, BSN, [place] Coordinator [phone]

## 2022-05-19 NOTE — Telephone Encounter (Signed)
   Telephone encounter was:  Successful.  05/19/2022 Name: [name] MRN: [id number] DOB: [birth year]  Raymond Hardin is a 82 y.o. year old male who is a primary care patient of Donella Stade, [place] . The community resource team was consulted for assistance with Transportation Needs  patient called [address] [place] transportation and we booked the appt to PCP on 05/24/2022 and the confirmation # is [phone] Patient aware to call human in the future , Also placed referral to [place] guide performed the following interventions: Patient provided with information about care guide support team and interviewed to confirm resource needs.  Follow Up Plan:  No further follow up planned at this time. The patient has been provided with needed resources. [place] [phone] [address] , [place] [address] Email : Ashby [email or web address] [email or web address]

## 2022-05-24 ENCOUNTER — Encounter: Payer: Self-pay | Admitting: Physician Assistant

## 2022-05-24 ENCOUNTER — Ambulatory Visit (INDEPENDENT_AMBULATORY_CARE_PROVIDER_SITE_OTHER): Payer: Medicare HMO | Admitting: Physician Assistant

## 2022-05-24 VITALS — BP 186/89 | HR 87 | Ht 70.0 in | Wt 242.0 lb

## 2022-05-24 DIAGNOSIS — J4489 Other specified chronic obstructive pulmonary disease: Secondary | ICD-10-CM

## 2022-05-24 DIAGNOSIS — R6889 Other general symptoms and signs: Secondary | ICD-10-CM | POA: Diagnosis not present

## 2022-05-24 DIAGNOSIS — Z8709 Personal history of other diseases of the respiratory system: Secondary | ICD-10-CM | POA: Diagnosis not present

## 2022-05-24 DIAGNOSIS — I10 Essential (primary) hypertension: Secondary | ICD-10-CM | POA: Diagnosis not present

## 2022-05-24 DIAGNOSIS — F17200 Nicotine dependence, unspecified, uncomplicated: Secondary | ICD-10-CM

## 2022-05-24 MED ORDER — ALBUTEROL SULFATE HFA 108 (90 BASE) MCG/ACT IN AERS: 2.0000 | INHALATION_SPRAY | Freq: Four times a day (QID) | RESPIRATORY_TRACT | 1 refills | Status: DC | PRN

## 2022-05-24 MED ORDER — NICOTINE 14 MG/24HR TD PT24: 14.0000 mg | MEDICATED_PATCH | Freq: Every day | TRANSDERMAL | 0 refills | Status: DC

## 2022-05-24 MED ORDER — AMLODIPINE BESYLATE 2.5 MG PO TABS: 2.5000 mg | ORAL_TABLET | Freq: Every day | ORAL | 0 refills | Status: DC

## 2022-05-24 MED ORDER — NICOTINE 7 MG/24HR TD PT24: 7.0000 mg | MEDICATED_PATCH | Freq: Every day | TRANSDERMAL | 0 refills | Status: DC

## 2022-05-24 NOTE — Patient Instructions (Addendum)
Norvasc(amlodipine) for BP once a day.

## 2022-05-24 NOTE — Progress Notes (Addendum)
Acute Office Visit  Subjective:     Patient ID: Raymond Hardin, male    DOB: Mar 08, 1941, 82 y.o.   MRN: [id number]  Chief Complaint  Patient presents with   Hospitalization Follow-up    HPI Patient is in today for a follow up after being hospitalized for influenza A on 05/06/2022.  Past medical history includes COPD, dyslipidemia, Type 2 DM, primary hypertension.  The patient states that he is doing well. He finished his course of Tamiflu and prednisone that he was prescribes in the hospital. He is no longer having any shortness of breath, cough, fever, chills, chest pain or body aches. The patient wanted to discuss the use of nicotine patches to aid in smoking cessation. He smokes 4-5 cigarettes a day for the past 7 years. He would like to stop smoking as soon as possible. He plans on quitting cold [place]. He reports that if the nicotine patches are too expensive then he will continue to stop cold [place] without them. He reports that he does breathing exercises daily to help improve his lung function. He also says that he needs a refill on his albuterol inhaler.   .. Active Ambulatory Problems    Diagnosis Date Noted   Erectile dysfunction 11/04/2011   Tobacco abuse 12/12/2012   Depression 12/12/2012   Primary osteoarthritis of right knee 04/24/2013   Vitamin D deficiency 08/13/2013   Pre-diabetes 08/16/2013   Status post left knee replacement 08/13/2014   Anxiety state 08/13/2014   Essential hypertension, benign 08/13/2014   Hyperlipidemia 02/24/2015   Hypertriglyceridemia 02/24/2015   CKD (chronic kidney disease), stage III ([place]) 12/07/2015   S/P total knee replacement, right 01/31/2017   Bilateral cataracts 01/05/2018   Posterior vitreous detachment of left eye 04/09/2018   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   S/P bilateral cataract extraction 05/25/2018   Recurrent major depressive disorder, in partial remission ([place]) 06/19/2018   Skipped heart beats 09/17/2018   Strong  pulse 09/17/2018   Palpitation 09/19/2018   Acute bilateral low back pain without sciatica 01/28/2019   PAF (paroxysmal atrial fibrillation) ([place]) 05/13/2019   Low blood potassium 05/13/2019   Acute encephalopathy 05/16/2019   COPD (chronic obstructive pulmonary disease) with chronic bronchitis 05/16/2019   Hypotension 05/29/2019   History of COVID-19 06/11/2019   Mucopurulent chronic bronchitis ([place]) 06/11/2019   SOB (shortness of breath) on exertion 10/02/2019   Cough 11/04/2019   Type 2 diabetes mellitus with stage 3a chronic kidney disease, without long-term current use of insulin ([place]) 09/15/2020   Toenail fungus 04/27/2022   Polyneuropathy associated with underlying disease ([place]) 04/27/2022   Tobacco dependence 05/24/2022   History of influenza 05/24/2022   Resolved Ambulatory Problems    Diagnosis Date Noted   Hypertension, essential, benign 11/04/2011   Pneumonia of both lungs due to infectious organism 05/16/2019   Past Medical History:  Diagnosis Date   COPD (chronic obstructive pulmonary disease) ([place])    COVID-19 03/2019   Diabetes mellitus without complication ([place])    Hypertension    Insomnia 2005   Osteoarthritis 2000   Paroxysmal A-fib ([place])    Pneumonia      ROS Negative unless stated in HPI     Objective:    BP (!) 186/89   Pulse 87   Ht '5\' 10"'$  (1.778 m)   Wt 242 lb (109.8 kg)   SpO2 95%   BMI 34.72 kg/m  BP Readings from Last 3 Encounters:  05/24/22 (!) 186/89  04/27/22 (!) 154/82  08/24/21 Marland Kitchen)  152/88   Wt Readings from Last 3 Encounters:  05/24/22 242 lb (109.8 kg)  04/27/22 235 lb 0.6 oz (106.6 kg)  08/24/21 236 lb (107 kg)      Physical Exam Constitutional:      General: He is not in acute distress.    Appearance: Normal appearance. He is obese.  Cardiovascular:     Rate and Rhythm: Normal rate and regular rhythm.     Heart sounds: Normal heart sounds.  Pulmonary:     Effort: Pulmonary effort is normal. No respiratory  distress.     Breath sounds: Normal breath sounds.  Chest:     Chest wall: No tenderness.  Neurological:     Mental Status: He is alert.         Assessment & Plan:  Marland KitchenMarland KitchenTyr was seen today for hospitalization follow-up.  Diagnoses and all orders for this visit:  COPD (chronic obstructive pulmonary disease) with chronic bronchitis -     albuterol (VENTOLIN HFA) 108 (90 Base) MCG/ACT inhaler; Inhale 2 puffs into the lungs every 6 (six) hours as needed.  Essential hypertension, benign -     amLODipine (NORVASC) 2.5 MG tablet; Take 1 tablet (2.5 mg total) by mouth daily. For blood pressure.  Tobacco dependence -     nicotine (NICODERM CQ) 14 mg/24hr patch; Place 1 patch (14 mg total) onto the skin daily. -     nicotine (NICODERM CQ) 7 mg/24hr patch; Place 1 patch (7 mg total) onto the skin daily.  History of influenza    Encouraged smoking cessation and continued daily breathing exercises to improve lung function and decrease COPD exacerbations. 24 hour nicotine patches prescribedat '14mg'$  and '7mg'$  dosages. Continue albuterol inhaler as needed for shortness of breath. Continue trelegy daily. Feeling better from flu A.   Start amlodipine 2.5 mg tablet daily to manage hypertension. Follow up in 2 weeks for a nurse visit to reassess blood pressure.     Return in about 2 weeks (around 06/07/2022) for BP follow up.  Iran Planas, PA-C

## 2022-05-30 ENCOUNTER — Ambulatory Visit: Payer: Self-pay

## 2022-05-30 NOTE — Patient Instructions (Addendum)
Visit Information  Thank you for taking time to visit with me today. Please don't hesitate to contact me if I can be of assistance to you.   Following are the goals we discussed today:   Goals Addressed             This Visit's Progress    Care Coordination Activities       Care Coordination Interventions: Discussed BP with patient. Mr. Wadhwa states he has obtained amlodipine and is taking as prescribed Discussed importance of having a blood pressure monitor and routinely checking blood pressure in management of blood pressure. Patient states he has an appointment with the [facility] in March and was going to get blood pressure monitor from the [place]. RNCM also reiterated with patient that can obtain BP monitor through [facility] OTC Benefit. Also discussed can use automatic blood pressure monitor at his local drug store. Mr. Trimpe voiced understanding. Patient has follow up appointment on 06/01/21 for BP recheck.   RNCM reviewed provider instructions from office visit 05/24/22 Confirmed patient has transportation to the [place] appointment in March. Mr. Hillman states he is going to call [employer] to utilize his transportation benefit Referral to clinical pharmacist regarding medication assistance for albuterol inhaler and nicotine patches. Discussed continued fall precautions. Mr. Pruden to follow up with [place] social worker to follow up on life alert necklace.      Our next appointment is by telephone on 06/30/22 at 10:00 am  Please call the care guide team at [phone] if you need to cancel or reschedule your appointment.   If you are experiencing a Mental Health or [place] or need someone to talk to, please call the Suicide and Crisis Lifeline: [place], RN, MSN, BSN, [employer] [phone]

## 2022-05-30 NOTE — Patient Outreach (Addendum)
  Care Coordination   Follow Up Visit Note   05/30/2022 Name: Raymond Hardin MRN: [id number] DOB: July 30, 1940  Raymond Hardin is a 82 y.o. year old male who sees [place], Royetta Car, [place] for primary care. I spoke with  Raymond Hardin by phone today.  What matters to the patients health and wellness today?  Raymond Hardin reports he is doing better. PCP office visit completed on 05/24/22. Per review of chart: BP elevated and patient started on amlodipine. Raymond Hardin reports he is not checking blood pressure at home adding his machine is broke. He states he has not filled the Nicotine patch or inhaler due to cannot afford. He is awaiting his check to come in the first of the month so see if he will be able to afford.  Goals Addressed             This Visit's Progress    Care Coordination Activities       Care Coordination Interventions: Discussed BP with patient. Raymond Hardin states he has obtained amlodipine and is taking as prescribed Discussed importance of having a blood pressure monitor and routinely checking blood pressure in management of blood pressure. Patient states he has an appointment with the [facility] in March and was going to get blood pressure monitor from the [place]. RNCM also reiterated with patient that can obtain BP monitor through [facility] OTC Benefit. Also discussed can use automatic blood pressure monitor at his local drug store. Raymond Hardin voiced understanding. Patient has follow up appointment on 06/01/21 for BP recheck.   RNCM reviewed provider instructions from office visit 05/24/22 Confirmed patient has transportation to the [place] appointment in March. Raymond Hardin states he is going to call [employer] to utilize his transportation benefit Referral to clinical pharmacist regarding medication assistance for albuterol inhaler and nicotine patches. Discussed continued fall precautions. Raymond Hardin to follow up with [place] social worker to follow up on life alert necklace.      SDOH assessments and  interventions completed:  No  Care Coordination Interventions:  Yes, provided   Follow up plan: Follow up call scheduled for 06/30/22    Encounter Outcome:  Pt. Visit Completed   Thea Silversmith, RN, MSN, BSN, [place] Coordinator [phone]

## 2022-06-07 ENCOUNTER — Encounter: Payer: Self-pay | Admitting: Physician Assistant

## 2022-06-07 ENCOUNTER — Ambulatory Visit (INDEPENDENT_AMBULATORY_CARE_PROVIDER_SITE_OTHER): Payer: Medicare HMO | Admitting: Physician Assistant

## 2022-06-07 VITALS — BP 160/87 | HR 79 | Ht 70.0 in | Wt 239.0 lb

## 2022-06-07 DIAGNOSIS — I10 Essential (primary) hypertension: Secondary | ICD-10-CM

## 2022-06-07 NOTE — Progress Notes (Signed)
Established Patient Office Visit  Subjective   Patient ID: Raymond Hardin, male    DOB: 1941/03/12  Age: 82 y.o. MRN: [id number]  Chief Complaint  Patient presents with   Follow-up   Hypertension    HPI Pt is here to follow up on HTN. He admits he never picked up norvasc 2.'5mg'$  due to not having any money. He will pick up today. He is not checking BP at home. He denies any CP. He has had SOB and chronic cough. He has cut back on smoking but still smoking about 1/2 pack in last few days.   .. Active Ambulatory Problems    Diagnosis Date Noted   Erectile dysfunction 11/04/2011   Tobacco abuse 12/12/2012   Depression 12/12/2012   Primary osteoarthritis of right knee 04/24/2013   Vitamin D deficiency 08/13/2013   Pre-diabetes 08/16/2013   Status post left knee replacement 08/13/2014   Anxiety state 08/13/2014   Essential hypertension, benign 08/13/2014   Hyperlipidemia 02/24/2015   Hypertriglyceridemia 02/24/2015   CKD (chronic kidney disease), stage III ([place]) 12/07/2015   S/P total knee replacement, right 01/31/2017   Bilateral cataracts 01/05/2018   Posterior vitreous detachment of left eye 04/09/2018   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   S/P bilateral cataract extraction 05/25/2018   Recurrent major depressive disorder, in partial remission ([place]) 06/19/2018   Skipped heart beats 09/17/2018   Strong pulse 09/17/2018   Palpitation 09/19/2018   Acute bilateral low back pain without sciatica 01/28/2019   PAF (paroxysmal atrial fibrillation) ([place]) 05/13/2019   Low blood potassium 05/13/2019   Acute encephalopathy 05/16/2019   COPD (chronic obstructive pulmonary disease) with chronic bronchitis 05/16/2019   Hypotension 05/29/2019   History of COVID-19 06/11/2019   Mucopurulent chronic bronchitis ([place]) 06/11/2019   SOB (shortness of breath) on exertion 10/02/2019   Cough 11/04/2019   Type 2 diabetes mellitus with stage 3a chronic kidney disease, without long-term current use of  insulin ([place]) 09/15/2020   Toenail fungus 04/27/2022   Polyneuropathy associated with underlying disease ([place]) 04/27/2022   Tobacco dependence 05/24/2022   History of influenza 05/24/2022   Resolved Ambulatory Problems    Diagnosis Date Noted   Hypertension, essential, benign 11/04/2011   Pneumonia of both lungs due to infectious organism 05/16/2019   Past Medical History:  Diagnosis Date   COPD (chronic obstructive pulmonary disease) ([place])    COVID-19 03/2019   Diabetes mellitus without complication ([place])    Hypertension    Insomnia 2005   Osteoarthritis 2000   Paroxysmal A-fib ([place])    Pneumonia      ROS   See HPI.  Objective:     BP (!) 160/87   Pulse 79   Ht '5\' 10"'$  (1.778 m)   Wt 239 lb (108.4 kg)   SpO2 98%   BMI 34.29 kg/m  BP Readings from Last 3 Encounters:  06/07/22 (!) 160/87  05/24/22 (!) 186/89  04/27/22 (!) 154/82   Wt Readings from Last 3 Encounters:  06/07/22 239 lb (108.4 kg)  05/24/22 242 lb (109.8 kg)  04/27/22 235 lb 0.6 oz (106.6 kg)      Physical Exam Constitutional:      Appearance: Normal appearance. He is obese.  Cardiovascular:     Rate and Rhythm: Normal rate.     Pulses: Normal pulses.  Pulmonary:     Effort: Pulmonary effort is normal.     Comments: Coarse breath sounds.  Neurological:     General: No focal deficit present.  Mental Status: He is alert.         Assessment & Plan:  Marland KitchenMarland KitchenKylan was seen today for follow-up and hypertension.  Diagnoses and all orders for this visit:  Essential hypertension, benign   BP not to goal of under 130/80 due to high risk.  Start norvasc 2.'5mg'$  and recheck BP in 2 weeks nurse visit.  Decrease salt intake and continue to work on smoking cessation.   Iran Planas, PA-C

## 2022-06-07 NOTE — Patient Instructions (Addendum)
Add the norvasc 2.'5mg'$  daily.  BP goal under 130/80.

## 2022-06-16 ENCOUNTER — Other Ambulatory Visit: Payer: Medicare HMO | Admitting: Pharmacist

## 2022-06-16 NOTE — Progress Notes (Signed)
06/16/2022 Name: Raymond Hardin MRN: [id number] DOB: 1940/10/09   Raymond Hardin is a 82 y.o. year old male who presented for a telephone visit.   They were referred to the pharmacist by their Case Management Team  for assistance in managing medication access.    Subjective:  Care Team: Primary Care Provider: Lavada Mesi ; Next Scheduled Visit: 06/28/22 RN visit for BP check  Medication Access/Adherence  Current Pharmacy:  [place], [place] - [id number] [place] [address] Phone: [phone] Fax: [place] [place] [address] Phone: [phone] Fax: [phone]   Patient reports affordability concerns with their medications: Yes    Hypertension:  Current medications: valsartan 337m daily, amlodipine 2.522mdaily  Patient does not have a validated, automated, upper arm home BP cuff  Patient denies hypotensive s/sx including dizziness, lightheadedness.  Patient denies hypertensive symptoms including headache, chest pain, shortness of breath   Tobacco Abuse:  Tobacco Use History: Age when started using tobacco on a daily basis: [age]r old, about 50 years Number of cigarettes per day 1ppd prior to nicotine patch Smokes first cigarette 30 minutes after waking Does not wake at night to smoke  Triggers include physical: physical habit, patient is around others who smoke  Quit Attempt History: Most recent quit attempt 2 yrs ago Longest time ever been tobacco free 6 months Methods tried in the past include None.  Motivators to quitting include watching older sister succomb to COPD; barriers include friends smoke    Objective:  Lab Results  Component Value Date   HGBA1C 7.0 (A) 04/27/2022    Lab Results  Component Value Date   CREATININE 1.51 (H) 07/19/2021   BUN 20 07/19/2021   NA  142 07/19/2021   K 4.2 07/19/2021   CL 106 07/19/2021   CO2 27 07/19/2021    Lab Results  Component Value Date   CHOL 172 07/19/2021   HDL 46 07/19/2021   LDLCALC 89 07/19/2021   TRIG 284 (H) 07/19/2021   CHOLHDL 3.7 07/19/2021    Medications Reviewed Today     Reviewed by KlDarius BumpRPLuxemburgPharmacist) on 06/16/22 at 1502  Med List Status: <None>   Medication Order Taking? Sig Documenting Provider Last Dose Status Informant  albuterol (VENTOLIN HFA) 108 (90 Base) MCG/ACT inhaler [id number] Inhale 2 puffs into the lungs every 6 (six) hours as needed. BrDonella StadePA-C Taking Active   ALPRAZolam (XDuanne Moron0.5 MG tablet [id number] TAKE 1 TABLET(0.5 MG) BY MOUTH AT BEDTIME AS NEEDED FOR ANXIETY Breeback, Jade L, PA-C Unknown Active   amLODipine (NORVASC) 2.5 MG tablet [id number] Take 1 tablet (2.5 mg total) by mouth daily. For blood pressure. BrDonella StadePA-C Taking Active   ARIPiprazole (ABILIFY) 2 MG tablet [id number] Take 1 tablet (2 mg total) by mouth daily. BrDonella StadePA-C Taking Active   atorvastatin (LIPITOR) 20 MG tablet [id number] Take 1 tablet (20 mg total) by mouth daily. BrDonella StadePA-C Taking Active   busPIRone (BUSPAR) 15 MG tablet [id number] Take 1 tablet (15 mg total) by mouth 3 (three) times daily. BrDonella StadePA-C Taking Active   cholecalciferol (VITAMIN D) 1000 units tablet [id number] Take 1,000 Units by mouth daily. [provider] Taking Active   dapagliflozin propanediol (  FARXIGA) 10 MG TABS tablet [id number] Yes Take 1 tablet (10 mg total) by mouth daily. Donella Stade, PA-C Taking Active   escitalopram (LEXAPRO) 20 MG tablet [id number] Yes TAKE ONE TABLET BY MOUTH DAILY FOR MENTAL HEALTH [provider] Taking Active   Fluticasone-Umeclidin-Vilant (TRELEGY ELLIPTA) 100-62.5-25 MCG/ACT AEPB [id number] Yes Inhale 1 puff into the lungs daily. Donella Stade, PA-C Taking Active   metFORMIN  (GLUCOPHAGE) 1000 MG tablet [id number] Yes Take 1 tablet (1,000 mg total) by mouth 2 (two) times daily with a meal. Breeback, Jade L, PA-C Taking Active   metoprolol tartrate (LOPRESSOR) 50 MG tablet [id number] Yes TAKE ONE TABLET BY MOUTH TWICE A DAY FOR HEART [provider] Taking Active   nicotine (NICODERM CQ) 14 mg/24hr patch [id number] Yes Place 1 patch (14 mg total) onto the skin daily. Donella Stade, PA-C Taking Active   nicotine (NICODERM CQ) 7 mg/24hr patch [id number] Yes Place 1 patch (7 mg total) onto the skin daily. Donella Stade, PA-C Taking Active   sildenafil (REVATIO) 20 MG tablet [id number] Yes TAKE 5 TABLETS BY MOUTH AS NEEDED FOR ERECTILE DYSFUNCTION Breeback, Jade L, PA-C Taking Active   terbinafine (LAMISIL) 250 MG tablet [id number] Yes Take 1 tablet (250 mg total) by mouth daily. Donella Stade, PA-C Taking Active   valsartan (DIOVAN) 320 MG tablet [id number] Yes Take 1 tablet (320 mg total) by mouth daily. Appt for refills Breeback, Jade L, PA-C Taking Active   vortioxetine HBr (TRINTELLIX) 20 MG TABS tablet [id number] Yes Take 1 tablet (20 mg total) by mouth daily. Donella Stade, PA-C Taking Active   XARELTO 20 MG TABS tablet [id number] Yes Take 1 tablet (20 mg total) by mouth daily. Donella Stade, PA-C Taking Active               Assessment/Plan:   Hypertension: - Currently uncontrolled, patient did however pick up his medication - Reviewed appropriate blood pressure monitoring technique and reviewed goal blood pressure. Recommended to check home blood pressure and heart rate - Recommend to continue current medication, keep upcoming appointment for blood pressure check, and reinforced importance of obtaining home blood pressure cuff using OTC benefits  Tobacco Abuse - Currently uncontrolled, but patient is making progress  - Provided motivational interviewing to assess tobacco use and strategies for reduction, encouraged use of TWO methods (dual  NRT, he states he wishes to try plain hard candy first) - Provided information on 1 800 QUIT NOW support program  Follow Up Plan: 1 week to consider LIS Extra Help application, instructed patient to compile his income documents for our next call  Larinda Buttery, PharmD Clinical Pharmacist [facility] [phone]

## 2022-06-21 ENCOUNTER — Other Ambulatory Visit: Payer: Medicare HMO | Admitting: Pharmacist

## 2022-06-21 NOTE — Progress Notes (Signed)
06/21/2022 Name: Raymond Hardin MRN: [id number] DOB: [birth year]   Raymond Hardin is a 82 y.o. year old male who presented for a telephone visit.   They were referred to the pharmacist by their Case Management Team  for assistance in managing medication access.    Subjective:  Care Team: Primary Care Provider: Lavada Mesi ; Next Scheduled Visit: 06/28/22 RN visit for BP check  Medication Access/Adherence  Current Pharmacy:  [place], [place] - [id number] [place] [address] Phone: [phone] Fax: [place], [place] [address] Phone: [phone] Fax: [phone]   Patient reports affordability concerns with their medications: Yes    Tobacco Abuse:  Tobacco Use History: Age when started using tobacco on a daily basis: 82 yr old, about 50 years Number of cigarettes per day 1ppd prior to nicotine patch Smokes first cigarette 30 minutes after waking Does not wake at night to smoke  Triggers include physical: physical habit, patient is around others who smoke  Quit Attempt History: Most recent quit attempt 2 yrs ago Longest time ever been tobacco free 6 months Methods tried in the past include None.  Motivators to quitting include watching older sister succomb to COPD; barriers include friends smoke    Objective:  Lab Results  Component Value Date   HGBA1C 7.0 (A) 04/27/2022    Lab Results  Component Value Date   CREATININE 1.51 (H) 07/19/2021   BUN 20 07/19/2021   NA 142 07/19/2021   K 4.2 07/19/2021   CL 106 07/19/2021   CO2 27 07/19/2021    Lab Results  Component Value Date   CHOL 172 07/19/2021   HDL 46 07/19/2021   LDLCALC 89 07/19/2021   TRIG 284 (H) 07/19/2021   CHOLHDL 3.7 07/19/2021    Medications Reviewed Today     Reviewed by Darius Bump, [place]  (Pharmacist) on 06/16/22 at 1502  Med List Status: <None>   Medication Order Taking? Sig Documenting Provider Last Dose Status Informant  albuterol (VENTOLIN HFA) 108 (90 Base) MCG/ACT inhaler [id number] Yes Inhale 2 puffs into the lungs every 6 (six) hours as needed. Donella Stade, PA-C Taking Active   ALPRAZolam Duanne Moron) 0.5 MG tablet [id number] No TAKE 1 TABLET(0.5 MG) BY MOUTH AT BEDTIME AS NEEDED FOR ANXIETY Breeback, Jade L, PA-C Unknown Active   amLODipine (NORVASC) 2.5 MG tablet [id number] Yes Take 1 tablet (2.5 mg total) by mouth daily. For blood pressure. Donella Stade, PA-C Taking Active   ARIPiprazole (ABILIFY) 2 MG tablet [id number] Yes Take 1 tablet (2 mg total) by mouth daily. Donella Stade, PA-C Taking Active   atorvastatin (LIPITOR) 20 MG tablet [id number] Yes Take 1 tablet (20 mg total) by mouth daily. Donella Stade, PA-C Taking Active   busPIRone (BUSPAR) 15 MG tablet [id number] Yes Take 1 tablet (15 mg total) by mouth 3 (three) times daily. Donella Stade, PA-C Taking Active   cholecalciferol (VITAMIN D) 1000 units tablet [id number] Yes Take 1,000 Units by mouth daily. [provider] Taking Active   dapagliflozin propanediol (FARXIGA) 10 MG TABS tablet [id number] Yes Take 1 tablet (10 mg total) by mouth daily. Donella Stade, PA-C Taking Active   escitalopram (LEXAPRO) 20 MG tablet [id number] Yes TAKE ONE TABLET BY MOUTH DAILY FOR MENTAL HEALTH [provider] Taking  Active   Fluticasone-Umeclidin-Vilant (TRELEGY ELLIPTA) 100-62.5-25 MCG/ACT AEPB [id number] Yes Inhale 1 puff into the lungs daily. Donella Stade, PA-C Taking Active   metFORMIN (GLUCOPHAGE) 1000 MG tablet [id number] Yes Take 1 tablet (1,000 mg total) by mouth 2 (two) times daily with a meal. Breeback, Jade L, PA-C Taking Active   metoprolol tartrate (LOPRESSOR) 50 MG tablet [id number] Yes TAKE ONE TABLET BY MOUTH TWICE A DAY FOR HEART [provider] Taking Active   nicotine (NICODERM  CQ) 14 mg/24hr patch [id number] Yes Place 1 patch (14 mg total) onto the skin daily. Donella Stade, PA-C Taking Active   nicotine (NICODERM CQ) 7 mg/24hr patch [id number] Yes Place 1 patch (7 mg total) onto the skin daily. Donella Stade, PA-C Taking Active   sildenafil (REVATIO) 20 MG tablet [id number] Yes TAKE 5 TABLETS BY MOUTH AS NEEDED FOR ERECTILE DYSFUNCTION Breeback, Jade L, PA-C Taking Active   terbinafine (LAMISIL) 250 MG tablet [id number] Yes Take 1 tablet (250 mg total) by mouth daily. Donella Stade, PA-C Taking Active   valsartan (DIOVAN) 320 MG tablet [id number] Yes Take 1 tablet (320 mg total) by mouth daily. Appt for refills Breeback, Jade L, PA-C Taking Active   vortioxetine HBr (TRINTELLIX) 20 MG TABS tablet [id number] Yes Take 1 tablet (20 mg total) by mouth daily. Donella Stade, PA-C Taking Active   XARELTO 20 MG TABS tablet [id number] Yes Take 1 tablet (20 mg total) by mouth daily. Donella Stade, PA-C Taking Active               Assessment/Plan:    Tobacco Abuse - Currently uncontrolled, but patient is making progress  - Provided motivational interviewing to assess tobacco use and strategies for reduction, encouraged use of TWO methods (dual NRT, he states he wishes to try plain hard candy first) - Provided information on 1 800 QUIT NOW support program - For improving medication access, we completed the LIS Extra Help application today over the phone. Instructed patient to keep an eye out in the mail for a determination letter  Follow Up Plan: 1 month  Larinda Buttery, PharmD Clinical Pharmacist [place] [phone]

## 2022-06-28 ENCOUNTER — Ambulatory Visit: Payer: Medicare HMO

## 2022-06-30 ENCOUNTER — Ambulatory Visit: Payer: Self-pay

## 2022-06-30 DIAGNOSIS — Z139 Encounter for screening, unspecified: Secondary | ICD-10-CM

## 2022-06-30 NOTE — Patient Outreach (Signed)
  Care Coordination   Follow Up Visit Note   06/30/2022 Name: Raymond Hardin MRN: [id number] DOB: 10-07-1940  Raymond Hardin is a 82 y.o. year old male who sees [place], Royetta Car, [place] for primary care. I spoke with  Raymond Hardin by phone today.  What matters to the patients health and wellness today?  Mr. Leichtman reports he had to cancel his blood pressure check appointment due to lack of transportation. He has appointments with the [facility] on [id number] that conflict with nurse BP checkat PCP office. Mr. Hocevar denies any questions or concerns at this time.     Goals Addressed             This Visit's Progress    Care Coordination Activities       Interventions Today    Flowsheet Row Most Recent Value  Chronic Disease   Chronic disease during today's visit Hypertension (HTN)  General Interventions   General Interventions Discussed/Reviewed General Interventions Reviewed, Doctor Visits, Durable Medical Equipment (DME)  Doctor Visits Discussed/Reviewed Doctor Visits Discussed, Doctor Visits Reviewed  Christena Flake to reschedule f/u nurse BP check with PCP for 07/07/22 10 am. Patient notified.]  Durable Medical Equipment (DME) BP Cuff  [patient states he has a blood pressure cuff now, but needs to make sure the batteries are good.]  Education Interventions   Education Provided Provided Education  [discussed the importance of checking and recording blood pressure]  Provided Verbal Education On Medication  [reiterated importance of taking his amlodipine in managment of blood pressure, reiterated the importance of attending provider visits as scheduled]  Pharmacy Interventions   Pharmacy Dicussed/Reviewed Medication Adherence  [confirmed patient has obtained amlodipine and is taking it as prescribed]           SDOH assessments and interventions completed:  No  Care Coordination Interventions:  Yes, provided   Follow up plan: Follow up call scheduled for 08/01/22    Encounter Outcome:  Pt.  Visit Completed   Thea Silversmith, RN, MSN, BSN, [place] Coordinator [phone]

## 2022-06-30 NOTE — Patient Instructions (Signed)
Visit Information  Thank you for taking time to visit with me today. Please don't hesitate to contact me if I can be of assistance to you.   Following are the goals we discussed today:   Goals Addressed             This Visit's Progress    Care Coordination Activities       Interventions Today    Flowsheet Row Most Recent Value  Chronic Disease   Chronic disease during today's visit Hypertension (HTN)  General Interventions   General Interventions Discussed/Reviewed General Interventions Reviewed, Doctor Visits, Durable Medical Equipment (DME)  Doctor Visits Discussed/Reviewed Doctor Visits Discussed, Doctor Visits Reviewed  Christena Flake to reschedule f/u nurse BP check with PCP for 07/07/22 10 am. Patient notified.]  Durable Medical Equipment (DME) BP Cuff  [patient states he has a blood pressure cuff now, but needs to make sure the batteries are good.]  Education Interventions   Education Provided Provided Education  [discussed the importance of checking and recording blood pressure]  Provided Verbal Education On Medication  [reiterated importance of taking his amlodipine in managment of blood pressure, reiterated the importance of attending provider visits as scheduled]  Pharmacy Interventions   Pharmacy Dicussed/Reviewed Medication Adherence  [confirmed patient has obtained amlodipine and is taking it as prescribed]           Our next appointment is by telephone on 08/01/22 at 10:30 am  Please call the care guide team at [phone] if you need to cancel or reschedule your appointment.   If you are experiencing a Mental Health or [place] or need someone to talk to, please call the Suicide and Crisis Lifeline: [place], RN, MSN, BSN, [place] [phone]

## 2022-07-04 ENCOUNTER — Telehealth: Payer: Self-pay | Admitting: *Deleted

## 2022-07-04 ENCOUNTER — Other Ambulatory Visit: Payer: Self-pay

## 2022-07-04 ENCOUNTER — Telehealth: Payer: Self-pay

## 2022-07-04 DIAGNOSIS — N529 Male erectile dysfunction, unspecified: Secondary | ICD-10-CM

## 2022-07-04 MED ORDER — SILDENAFIL CITRATE 20 MG PO TABS: ORAL_TABLET | ORAL | 5 refills | Status: DC

## 2022-07-04 NOTE — Patient Outreach (Signed)
  Care Coordination   Care Care Coordinator  Visit Note   07/04/2022 Name: WILFERD KAWALEC MRN: [id number] DOB: [birth year]  [name] is a 82 y.o. year old male who sees [place], Royetta Car, [place] for primary care. I spoke with  [name] by phone today.  What matters to the patients health and wellness today?  Care Coordination-RNCM received message from patient requesting assistance with arranging [place] to appointment on Thursday to PCP office and on Friday to [place] appointment.   Goals Addressed             This Visit's Progress    Care Coordination Activities       Interventions Today    Flowsheet Row Most Recent Value  General Interventions   General Interventions Discussed/Reviewed Communication with  [facility] Guide, Stacie A. to follow up on transportation request.regarding transportation-message sent to [facility] G.]  PCP/Specialist Visits Compliance with follow-up visit           SDOH assessments and interventions completed:  No  Care Coordination Interventions:  Yes, provided   Follow up plan:  as previously scheduled    Encounter Outcome:  Pt. Visit Completed

## 2022-07-04 NOTE — Telephone Encounter (Signed)
Patient wants refill on Sildenafil 20 mg tablets.  Last appointment 06/07/22 Date of last prescription written 08/24/21

## 2022-07-04 NOTE — Telephone Encounter (Signed)
   Telephone encounter was:  Successful.  07/04/2022 Name: Raymond Hardin MRN: [id number] DOB: Jul 12, 1940  Raymond Hardin is a 82 y.o. year old male who is a primary care patient of Donella Stade, [place] . The community resource team was consulted for assistance with Transportation Needs   Care guide performed the following interventions: Patient provided with information about care guide support team and interviewed to confirm resource needs. Completed appt for 07/07/2022 confirmation number  [id number] < patient did not want to continue the booking for [facility] rides due to stress. will call back if he needs to schedule it  Follow Up Plan:  No further follow up planned at this time. The patient has been provided with needed resources. [place] [phone] [address] , [place] [address] Email : [email or web address]

## 2022-07-07 ENCOUNTER — Ambulatory Visit (INDEPENDENT_AMBULATORY_CARE_PROVIDER_SITE_OTHER): Payer: Medicare HMO | Admitting: Family Medicine

## 2022-07-07 VITALS — BP 143/64 | HR 72 | Ht 70.0 in

## 2022-07-07 DIAGNOSIS — I10 Essential (primary) hypertension: Secondary | ICD-10-CM

## 2022-07-07 DIAGNOSIS — R6889 Other general symptoms and signs: Secondary | ICD-10-CM | POA: Diagnosis not present

## 2022-07-07 NOTE — Patient Instructions (Signed)
increase amlodipine 2.5 mg to '5mg'$  ( patient will double current tablets to reach this strength) - patient will keep scheduled fu appt with Iran Planas , PA on 07/29/2022.

## 2022-07-07 NOTE — Progress Notes (Signed)
   Established Patient Office Visit  Subjective   Patient ID: Raymond Hardin, male    DOB: [birth year]  Age: 82 y.o. MRN: [id number]  Chief Complaint  Patient presents with   Hypertension    BP check - nurse visit-     HPI  Hypertension- BP check - nurse visit - patient states he has been taking the amlodipine 2.20m  - states he has attemtped to decrease salt intake and smoking amount.   ROS    Objective:     BP (!) 143/64 (BP Location: Right Arm, Patient Position: Sitting, Cuff Size: Large)   Pulse 72   Ht 5' 10"$  (1.778 m)   SpO2 100%   BMI 34.29 kg/m    Physical Exam   No results found for any visits on 07/07/22.    The 10-year ASCVD risk score (Arnett DK, et al., 2019) is: 62.7%* (Cholesterol units were assumed)    Assessment & Plan:  BP check - nurse visit - initial BP reading 143/117 ( BP taken before patient had time to rest) Left arm and 143/64 Right arm - second reading 139/70 left arm and 135/67 right arm . Dr. MMadilyn Firemanreviewed results. Patient should increase amlodipine 2.5 mg to 525m( patient will double current tablets to reach this strength) - patient will keep scheduled fu appt with JaIran Planas PA on 07/29/2022. Problem List Items Addressed This Visit   None   Return for patient  will keep appt 07/29/2022 with JaIran Planas. Rae LipsLPN

## 2022-07-07 NOTE — Progress Notes (Signed)
HTN - increase amlodipine to '5mg'$  dialy. Keep f/u scheduled in 4 weeks.

## 2022-07-08 ENCOUNTER — Telehealth: Payer: Self-pay

## 2022-07-08 ENCOUNTER — Other Ambulatory Visit: Payer: Self-pay | Admitting: Physician Assistant

## 2022-07-08 ENCOUNTER — Ambulatory Visit: Payer: Medicare HMO

## 2022-07-08 DIAGNOSIS — F3341 Major depressive disorder, recurrent, in partial remission: Secondary | ICD-10-CM

## 2022-07-08 NOTE — Telephone Encounter (Signed)
Spoke with patient informed that can pick up handicap placard form at front desk. Patient states will pick up on Monday 07/11/22. Also advised that will need to sign form before sending to [facility].

## 2022-07-12 DIAGNOSIS — R0602 Shortness of breath: Secondary | ICD-10-CM | POA: Diagnosis not present

## 2022-07-12 DIAGNOSIS — E669 Obesity, unspecified: Secondary | ICD-10-CM | POA: Diagnosis not present

## 2022-07-12 DIAGNOSIS — E1122 Type 2 diabetes mellitus with diabetic chronic kidney disease: Secondary | ICD-10-CM | POA: Diagnosis not present

## 2022-07-12 DIAGNOSIS — Z6834 Body mass index (BMI) 34.0-34.9, adult: Secondary | ICD-10-CM | POA: Diagnosis not present

## 2022-07-12 DIAGNOSIS — W19XXXA Unspecified fall, initial encounter: Secondary | ICD-10-CM | POA: Diagnosis not present

## 2022-07-12 DIAGNOSIS — M6282 Rhabdomyolysis: Secondary | ICD-10-CM | POA: Diagnosis not present

## 2022-07-12 DIAGNOSIS — S0990XA Unspecified injury of head, initial encounter: Secondary | ICD-10-CM | POA: Diagnosis not present

## 2022-07-12 DIAGNOSIS — J449 Chronic obstructive pulmonary disease, unspecified: Secondary | ICD-10-CM | POA: Diagnosis not present

## 2022-07-12 DIAGNOSIS — I480 Paroxysmal atrial fibrillation: Secondary | ICD-10-CM | POA: Diagnosis not present

## 2022-07-12 DIAGNOSIS — S0083XA Contusion of other part of head, initial encounter: Secondary | ICD-10-CM | POA: Diagnosis not present

## 2022-07-12 DIAGNOSIS — I129 Hypertensive chronic kidney disease with stage 1 through stage 4 chronic kidney disease, or unspecified chronic kidney disease: Secondary | ICD-10-CM | POA: Diagnosis not present

## 2022-07-12 DIAGNOSIS — Z043 Encounter for examination and observation following other accident: Secondary | ICD-10-CM | POA: Diagnosis not present

## 2022-07-12 DIAGNOSIS — M545 Low back pain, unspecified: Secondary | ICD-10-CM | POA: Diagnosis not present

## 2022-07-12 DIAGNOSIS — R000 Tachycardia, unspecified: Secondary | ICD-10-CM | POA: Diagnosis not present

## 2022-07-12 DIAGNOSIS — I499 Cardiac arrhythmia, unspecified: Secondary | ICD-10-CM | POA: Diagnosis not present

## 2022-07-12 DIAGNOSIS — G8911 Acute pain due to trauma: Secondary | ICD-10-CM | POA: Diagnosis not present

## 2022-07-12 DIAGNOSIS — I10 Essential (primary) hypertension: Secondary | ICD-10-CM | POA: Diagnosis not present

## 2022-07-12 DIAGNOSIS — E785 Hyperlipidemia, unspecified: Secondary | ICD-10-CM | POA: Diagnosis not present

## 2022-07-12 DIAGNOSIS — S199XXA Unspecified injury of neck, initial encounter: Secondary | ICD-10-CM | POA: Diagnosis not present

## 2022-07-13 DIAGNOSIS — S199XXA Unspecified injury of neck, initial encounter: Secondary | ICD-10-CM | POA: Diagnosis not present

## 2022-07-13 DIAGNOSIS — I480 Paroxysmal atrial fibrillation: Secondary | ICD-10-CM | POA: Diagnosis not present

## 2022-07-13 DIAGNOSIS — Z8659 Personal history of other mental and behavioral disorders: Secondary | ICD-10-CM | POA: Diagnosis not present

## 2022-07-13 DIAGNOSIS — S0990XA Unspecified injury of head, initial encounter: Secondary | ICD-10-CM | POA: Diagnosis not present

## 2022-07-13 DIAGNOSIS — M6282 Rhabdomyolysis: Secondary | ICD-10-CM | POA: Diagnosis not present

## 2022-07-13 DIAGNOSIS — N1831 Chronic kidney disease, stage 3a: Secondary | ICD-10-CM | POA: Diagnosis not present

## 2022-07-13 DIAGNOSIS — Z794 Long term (current) use of insulin: Secondary | ICD-10-CM | POA: Diagnosis not present

## 2022-07-13 DIAGNOSIS — I10 Essential (primary) hypertension: Secondary | ICD-10-CM | POA: Diagnosis not present

## 2022-07-13 DIAGNOSIS — Z043 Encounter for examination and observation following other accident: Secondary | ICD-10-CM | POA: Diagnosis not present

## 2022-07-13 DIAGNOSIS — E1122 Type 2 diabetes mellitus with diabetic chronic kidney disease: Secondary | ICD-10-CM | POA: Diagnosis not present

## 2022-07-14 ENCOUNTER — Other Ambulatory Visit: Payer: Medicare HMO | Admitting: Pharmacist

## 2022-07-14 DIAGNOSIS — M6282 Rhabdomyolysis: Secondary | ICD-10-CM | POA: Diagnosis not present

## 2022-07-15 DIAGNOSIS — M6282 Rhabdomyolysis: Secondary | ICD-10-CM | POA: Diagnosis not present

## 2022-07-16 DIAGNOSIS — M6282 Rhabdomyolysis: Secondary | ICD-10-CM | POA: Diagnosis not present

## 2022-07-17 DIAGNOSIS — M6282 Rhabdomyolysis: Secondary | ICD-10-CM | POA: Diagnosis not present

## 2022-07-17 DIAGNOSIS — R0602 Shortness of breath: Secondary | ICD-10-CM | POA: Diagnosis not present

## 2022-07-18 ENCOUNTER — Encounter: Payer: Medicare HMO | Admitting: Physician Assistant

## 2022-07-18 DIAGNOSIS — M6282 Rhabdomyolysis: Secondary | ICD-10-CM | POA: Diagnosis not present

## 2022-07-18 NOTE — Progress Notes (Signed)
Patient is currently in the hospital. Appt rescheduled for April.  This encounter was created in error - please disregard.

## 2022-07-19 DIAGNOSIS — E1122 Type 2 diabetes mellitus with diabetic chronic kidney disease: Secondary | ICD-10-CM | POA: Diagnosis not present

## 2022-07-19 DIAGNOSIS — Z794 Long term (current) use of insulin: Secondary | ICD-10-CM | POA: Diagnosis not present

## 2022-07-19 DIAGNOSIS — N1831 Chronic kidney disease, stage 3a: Secondary | ICD-10-CM | POA: Diagnosis not present

## 2022-07-19 DIAGNOSIS — M6282 Rhabdomyolysis: Secondary | ICD-10-CM | POA: Diagnosis not present

## 2022-07-19 DIAGNOSIS — Z8659 Personal history of other mental and behavioral disorders: Secondary | ICD-10-CM | POA: Diagnosis not present

## 2022-07-19 DIAGNOSIS — I480 Paroxysmal atrial fibrillation: Secondary | ICD-10-CM | POA: Diagnosis not present

## 2022-07-19 DIAGNOSIS — I10 Essential (primary) hypertension: Secondary | ICD-10-CM | POA: Diagnosis not present

## 2022-07-20 DIAGNOSIS — E1122 Type 2 diabetes mellitus with diabetic chronic kidney disease: Secondary | ICD-10-CM | POA: Diagnosis not present

## 2022-07-20 DIAGNOSIS — Z794 Long term (current) use of insulin: Secondary | ICD-10-CM | POA: Diagnosis not present

## 2022-07-20 DIAGNOSIS — M6282 Rhabdomyolysis: Secondary | ICD-10-CM | POA: Diagnosis not present

## 2022-07-20 DIAGNOSIS — I480 Paroxysmal atrial fibrillation: Secondary | ICD-10-CM | POA: Diagnosis not present

## 2022-07-20 DIAGNOSIS — I10 Essential (primary) hypertension: Secondary | ICD-10-CM | POA: Diagnosis not present

## 2022-07-20 DIAGNOSIS — Z8659 Personal history of other mental and behavioral disorders: Secondary | ICD-10-CM | POA: Diagnosis not present

## 2022-07-20 DIAGNOSIS — N1831 Chronic kidney disease, stage 3a: Secondary | ICD-10-CM | POA: Diagnosis not present

## 2022-07-21 ENCOUNTER — Other Ambulatory Visit: Payer: Medicare HMO | Admitting: Pharmacist

## 2022-07-21 ENCOUNTER — Telehealth: Payer: Self-pay | Admitting: Pharmacist

## 2022-07-21 DIAGNOSIS — N1831 Chronic kidney disease, stage 3a: Secondary | ICD-10-CM | POA: Diagnosis not present

## 2022-07-21 DIAGNOSIS — G319 Degenerative disease of nervous system, unspecified: Secondary | ICD-10-CM | POA: Diagnosis not present

## 2022-07-21 DIAGNOSIS — F419 Anxiety disorder, unspecified: Secondary | ICD-10-CM | POA: Diagnosis not present

## 2022-07-21 DIAGNOSIS — I480 Paroxysmal atrial fibrillation: Secondary | ICD-10-CM | POA: Diagnosis not present

## 2022-07-21 DIAGNOSIS — I129 Hypertensive chronic kidney disease with stage 1 through stage 4 chronic kidney disease, or unspecified chronic kidney disease: Secondary | ICD-10-CM | POA: Diagnosis not present

## 2022-07-21 DIAGNOSIS — F32A Depression, unspecified: Secondary | ICD-10-CM | POA: Diagnosis not present

## 2022-07-21 DIAGNOSIS — M1990 Unspecified osteoarthritis, unspecified site: Secondary | ICD-10-CM | POA: Diagnosis not present

## 2022-07-21 DIAGNOSIS — J449 Chronic obstructive pulmonary disease, unspecified: Secondary | ICD-10-CM | POA: Diagnosis not present

## 2022-07-21 DIAGNOSIS — E1122 Type 2 diabetes mellitus with diabetic chronic kidney disease: Secondary | ICD-10-CM | POA: Diagnosis not present

## 2022-07-21 NOTE — Progress Notes (Signed)
Attempted to contact patient for scheduled appointment for medication management. No answer x1, then upon repeat attempt, patient answered but hung up, he did have a visitor present in the background.   Unable to leave voicemail, will attempt to call for follow up at their convenience.    Larinda Buttery, PharmD Clinical Pharmacist [facility] [phone]

## 2022-07-22 DIAGNOSIS — N1831 Chronic kidney disease, stage 3a: Secondary | ICD-10-CM | POA: Diagnosis not present

## 2022-07-22 DIAGNOSIS — M1990 Unspecified osteoarthritis, unspecified site: Secondary | ICD-10-CM | POA: Diagnosis not present

## 2022-07-22 DIAGNOSIS — I129 Hypertensive chronic kidney disease with stage 1 through stage 4 chronic kidney disease, or unspecified chronic kidney disease: Secondary | ICD-10-CM | POA: Diagnosis not present

## 2022-07-22 DIAGNOSIS — F32A Depression, unspecified: Secondary | ICD-10-CM | POA: Diagnosis not present

## 2022-07-22 DIAGNOSIS — E1122 Type 2 diabetes mellitus with diabetic chronic kidney disease: Secondary | ICD-10-CM | POA: Diagnosis not present

## 2022-07-22 DIAGNOSIS — J449 Chronic obstructive pulmonary disease, unspecified: Secondary | ICD-10-CM | POA: Diagnosis not present

## 2022-07-22 DIAGNOSIS — F419 Anxiety disorder, unspecified: Secondary | ICD-10-CM | POA: Diagnosis not present

## 2022-07-22 DIAGNOSIS — G319 Degenerative disease of nervous system, unspecified: Secondary | ICD-10-CM | POA: Diagnosis not present

## 2022-07-22 DIAGNOSIS — I480 Paroxysmal atrial fibrillation: Secondary | ICD-10-CM | POA: Diagnosis not present

## 2022-07-25 ENCOUNTER — Telehealth: Payer: Self-pay | Admitting: Neurology

## 2022-07-25 DIAGNOSIS — I480 Paroxysmal atrial fibrillation: Secondary | ICD-10-CM | POA: Diagnosis not present

## 2022-07-25 DIAGNOSIS — F411 Generalized anxiety disorder: Secondary | ICD-10-CM

## 2022-07-25 DIAGNOSIS — G319 Degenerative disease of nervous system, unspecified: Secondary | ICD-10-CM | POA: Diagnosis not present

## 2022-07-25 DIAGNOSIS — M1990 Unspecified osteoarthritis, unspecified site: Secondary | ICD-10-CM | POA: Diagnosis not present

## 2022-07-25 DIAGNOSIS — N1831 Chronic kidney disease, stage 3a: Secondary | ICD-10-CM | POA: Diagnosis not present

## 2022-07-25 DIAGNOSIS — I129 Hypertensive chronic kidney disease with stage 1 through stage 4 chronic kidney disease, or unspecified chronic kidney disease: Secondary | ICD-10-CM | POA: Diagnosis not present

## 2022-07-25 DIAGNOSIS — F32A Depression, unspecified: Secondary | ICD-10-CM | POA: Diagnosis not present

## 2022-07-25 DIAGNOSIS — J449 Chronic obstructive pulmonary disease, unspecified: Secondary | ICD-10-CM | POA: Diagnosis not present

## 2022-07-25 DIAGNOSIS — F419 Anxiety disorder, unspecified: Secondary | ICD-10-CM | POA: Diagnosis not present

## 2022-07-25 DIAGNOSIS — E1122 Type 2 diabetes mellitus with diabetic chronic kidney disease: Secondary | ICD-10-CM

## 2022-07-25 MED ORDER — BLOOD GLUCOSE TEST VI STRP: 1.0000 | ORAL_STRIP | Freq: Three times a day (TID) | 0 refills | Status: AC

## 2022-07-25 MED ORDER — BUSPIRONE HCL 15 MG PO TABS: 15.0000 mg | ORAL_TABLET | Freq: Three times a day (TID) | ORAL | 1 refills | Status: DC

## 2022-07-25 MED ORDER — LANCET DEVICE MISC: 1.0000 | Freq: Three times a day (TID) | 0 refills | Status: AC

## 2022-07-25 MED ORDER — BLOOD GLUCOSE MONITORING SUPPL DEVI: 1.0000 | Freq: Three times a day (TID) | 0 refills | Status: DC

## 2022-07-25 MED ORDER — LANCETS MISC. MISC: 1.0000 | Freq: Three times a day (TID) | 0 refills | Status: AC

## 2022-07-25 NOTE — Telephone Encounter (Signed)
Mickel Baas, RN with [name], called [phone]) to request vo to see patient 1 x week for 5 weeks for medication management, DM, falls.  Called back and gave verbal orders to [place].

## 2022-07-26 NOTE — Telephone Encounter (Signed)
Raymond Hardin, PT with [name] ([phone]) called and LVM asking for VO for PT 1 x week for 4 weeks and every other week for 2 weeks. Called back and LVM with VO.

## 2022-07-27 DIAGNOSIS — M1990 Unspecified osteoarthritis, unspecified site: Secondary | ICD-10-CM | POA: Diagnosis not present

## 2022-07-27 DIAGNOSIS — G319 Degenerative disease of nervous system, unspecified: Secondary | ICD-10-CM | POA: Diagnosis not present

## 2022-07-27 DIAGNOSIS — I129 Hypertensive chronic kidney disease with stage 1 through stage 4 chronic kidney disease, or unspecified chronic kidney disease: Secondary | ICD-10-CM | POA: Diagnosis not present

## 2022-07-27 DIAGNOSIS — I480 Paroxysmal atrial fibrillation: Secondary | ICD-10-CM | POA: Diagnosis not present

## 2022-07-27 DIAGNOSIS — F32A Depression, unspecified: Secondary | ICD-10-CM | POA: Diagnosis not present

## 2022-07-27 DIAGNOSIS — N1831 Chronic kidney disease, stage 3a: Secondary | ICD-10-CM | POA: Diagnosis not present

## 2022-07-27 DIAGNOSIS — J449 Chronic obstructive pulmonary disease, unspecified: Secondary | ICD-10-CM | POA: Diagnosis not present

## 2022-07-27 DIAGNOSIS — F419 Anxiety disorder, unspecified: Secondary | ICD-10-CM | POA: Diagnosis not present

## 2022-07-27 DIAGNOSIS — E1122 Type 2 diabetes mellitus with diabetic chronic kidney disease: Secondary | ICD-10-CM | POA: Diagnosis not present

## 2022-07-28 DIAGNOSIS — J449 Chronic obstructive pulmonary disease, unspecified: Secondary | ICD-10-CM | POA: Diagnosis not present

## 2022-07-28 DIAGNOSIS — G319 Degenerative disease of nervous system, unspecified: Secondary | ICD-10-CM | POA: Diagnosis not present

## 2022-07-28 DIAGNOSIS — I480 Paroxysmal atrial fibrillation: Secondary | ICD-10-CM | POA: Diagnosis not present

## 2022-07-28 DIAGNOSIS — N1831 Chronic kidney disease, stage 3a: Secondary | ICD-10-CM | POA: Diagnosis not present

## 2022-07-28 DIAGNOSIS — F32A Depression, unspecified: Secondary | ICD-10-CM | POA: Diagnosis not present

## 2022-07-28 DIAGNOSIS — F419 Anxiety disorder, unspecified: Secondary | ICD-10-CM | POA: Diagnosis not present

## 2022-07-28 DIAGNOSIS — M1990 Unspecified osteoarthritis, unspecified site: Secondary | ICD-10-CM | POA: Diagnosis not present

## 2022-07-28 DIAGNOSIS — E1122 Type 2 diabetes mellitus with diabetic chronic kidney disease: Secondary | ICD-10-CM | POA: Diagnosis not present

## 2022-07-28 DIAGNOSIS — I129 Hypertensive chronic kidney disease with stage 1 through stage 4 chronic kidney disease, or unspecified chronic kidney disease: Secondary | ICD-10-CM | POA: Diagnosis not present

## 2022-07-29 ENCOUNTER — Ambulatory Visit: Payer: Medicare HMO | Admitting: Physician Assistant

## 2022-07-29 DIAGNOSIS — J449 Chronic obstructive pulmonary disease, unspecified: Secondary | ICD-10-CM | POA: Diagnosis not present

## 2022-07-29 DIAGNOSIS — N1831 Chronic kidney disease, stage 3a: Secondary | ICD-10-CM | POA: Diagnosis not present

## 2022-07-29 DIAGNOSIS — F32A Depression, unspecified: Secondary | ICD-10-CM | POA: Diagnosis not present

## 2022-07-29 DIAGNOSIS — F419 Anxiety disorder, unspecified: Secondary | ICD-10-CM | POA: Diagnosis not present

## 2022-07-29 DIAGNOSIS — M1990 Unspecified osteoarthritis, unspecified site: Secondary | ICD-10-CM | POA: Diagnosis not present

## 2022-07-29 DIAGNOSIS — G319 Degenerative disease of nervous system, unspecified: Secondary | ICD-10-CM | POA: Diagnosis not present

## 2022-07-29 DIAGNOSIS — I480 Paroxysmal atrial fibrillation: Secondary | ICD-10-CM | POA: Diagnosis not present

## 2022-07-29 DIAGNOSIS — E1122 Type 2 diabetes mellitus with diabetic chronic kidney disease: Secondary | ICD-10-CM | POA: Diagnosis not present

## 2022-07-29 DIAGNOSIS — I129 Hypertensive chronic kidney disease with stage 1 through stage 4 chronic kidney disease, or unspecified chronic kidney disease: Secondary | ICD-10-CM | POA: Diagnosis not present

## 2022-08-01 ENCOUNTER — Ambulatory Visit: Payer: Self-pay

## 2022-08-01 DIAGNOSIS — I129 Hypertensive chronic kidney disease with stage 1 through stage 4 chronic kidney disease, or unspecified chronic kidney disease: Secondary | ICD-10-CM | POA: Diagnosis not present

## 2022-08-01 DIAGNOSIS — F32A Depression, unspecified: Secondary | ICD-10-CM | POA: Diagnosis not present

## 2022-08-01 DIAGNOSIS — E1122 Type 2 diabetes mellitus with diabetic chronic kidney disease: Secondary | ICD-10-CM | POA: Diagnosis not present

## 2022-08-01 DIAGNOSIS — J449 Chronic obstructive pulmonary disease, unspecified: Secondary | ICD-10-CM | POA: Diagnosis not present

## 2022-08-01 DIAGNOSIS — M1990 Unspecified osteoarthritis, unspecified site: Secondary | ICD-10-CM | POA: Diagnosis not present

## 2022-08-01 DIAGNOSIS — G319 Degenerative disease of nervous system, unspecified: Secondary | ICD-10-CM | POA: Diagnosis not present

## 2022-08-01 DIAGNOSIS — I480 Paroxysmal atrial fibrillation: Secondary | ICD-10-CM | POA: Diagnosis not present

## 2022-08-01 DIAGNOSIS — F419 Anxiety disorder, unspecified: Secondary | ICD-10-CM | POA: Diagnosis not present

## 2022-08-01 DIAGNOSIS — N1831 Chronic kidney disease, stage 3a: Secondary | ICD-10-CM | POA: Diagnosis not present

## 2022-08-01 NOTE — Patient Outreach (Signed)
  Care Coordination   Follow Up Visit Note   08/01/2022 Name: KNOWLEDGE BAQUERO MRN: [id number] DOB: Feb 15, 1941  Raymond Hardin is a 82 y.o. year old male who sees [place], Royetta Car, [place] for primary care. I spoke with  Raymond Hardin by phone today.  What matters to the patients health and wellness today?  Patient reports recent fall getting out of chair. Reports he is not sure what happened-lost his balance. He states the [place] is in process of getting him a life alert system, but he is not sure where they are in the process. He states he will call the [place] to find out. He is currently active with home health-Per review of chart [facility]. Patient reports PCP appointment scheduled for 08/16/22. Patient is without questions or concerns at this time.   Goals Addressed             This Visit's Progress    Care Coordination Activities       Interventions Today    Flowsheet Row Most Recent Value  General Interventions   General Interventions Discussed/Reviewed General Interventions Reviewed, Doctor Visits, Community Resources  Doctor Visits Discussed/Reviewed PCP  PCP/Specialist Visits Compliance with follow-up visit  [reinforced the need to contact Insurance at least 3 days ahead to schedule transporation to PCP appointment. Also provided care guide number to provide assistance if needed. patient instructed to call RNCM as needed. for care coordination needs.]  Exercise Interventions   Exercise Discussed/Reviewed Exercise Discussed  [Advised patient to participate in exercises provided by therapist to assist with increasing strength, tone and balance.]  Education Interventions   Education Provided Provided Education  Provided Verbal Education On When to see the doctor, Other, [employer], Insurance Plans  [instructed on how to arrange transportation via insurance company]  Safety Interventions   Safety Discussed/Reviewed Fall Risk  [discussed life alert system. Patient reports VA was working  on providing. RNCM advised patient to contact [facility] to follow up.]           SDOH assessments and interventions completed:  No  Care Coordination Interventions:  Yes, provided   Follow up plan: Follow up call scheduled for 08/18/22    Encounter Outcome:  Pt. Visit Completed   Thea Silversmith, RN, MSN, BSN, [place] Coordinator [phone]

## 2022-08-01 NOTE — Patient Instructions (Addendum)
Visit Information  Thank you for taking time to visit with me today. Please don't hesitate to contact me if I can be of assistance to you.   Following are the goals we discussed today:   Goals Addressed             This Visit's Progress    Care Coordination Activities       Interventions Today    Flowsheet Row Most Recent Value  General Interventions   General Interventions Discussed/Reviewed General Interventions Reviewed, Doctor Visits, Community Resources  Doctor Visits Discussed/Reviewed PCP  PCP/Specialist Visits Compliance with follow-up visit  [reinforced the need to contact Insurance at least 3 days ahead to schedule transporation to PCP appointment. Also provided care guide number to provide assistance if needed. patient instructed to call RNCM as needed. for care coordination needs.]  Exercise Interventions   Exercise Discussed/Reviewed Exercise Discussed  [Advised patient to participate in exercises provided by therapist to assist with increasing strength, tone and balance.]  Education Interventions   Education Provided Provided Education  Provided Verbal Education On When to see the doctor, Other, [employer], Insurance Plans  [instructed on how to arrange transportation via insurance company]  Safety Interventions   Safety Discussed/Reviewed Fall Risk  [discussed life alert system. Patient reports [facility] was working on providing. RNCM advised patient to contact [facility] to follow up.]           Our next appointment is by telephone on 08/18/22 at 09:30 am  Please call the care guide team at [phone] if you need to cancel or reschedule your appointment.   If you are experiencing a Mental Health or [place] or need someone to talk to, please call the Suicide and Crisis Lifeline: [place], RN, MSN, BSN, [place] [phone]

## 2022-08-02 ENCOUNTER — Ambulatory Visit: Payer: Medicare HMO | Admitting: Physician Assistant

## 2022-08-02 DIAGNOSIS — F32A Depression, unspecified: Secondary | ICD-10-CM | POA: Diagnosis not present

## 2022-08-02 DIAGNOSIS — I129 Hypertensive chronic kidney disease with stage 1 through stage 4 chronic kidney disease, or unspecified chronic kidney disease: Secondary | ICD-10-CM | POA: Diagnosis not present

## 2022-08-02 DIAGNOSIS — M1990 Unspecified osteoarthritis, unspecified site: Secondary | ICD-10-CM | POA: Diagnosis not present

## 2022-08-02 DIAGNOSIS — G319 Degenerative disease of nervous system, unspecified: Secondary | ICD-10-CM | POA: Diagnosis not present

## 2022-08-02 DIAGNOSIS — I480 Paroxysmal atrial fibrillation: Secondary | ICD-10-CM | POA: Diagnosis not present

## 2022-08-02 DIAGNOSIS — F419 Anxiety disorder, unspecified: Secondary | ICD-10-CM | POA: Diagnosis not present

## 2022-08-02 DIAGNOSIS — E1122 Type 2 diabetes mellitus with diabetic chronic kidney disease: Secondary | ICD-10-CM | POA: Diagnosis not present

## 2022-08-02 DIAGNOSIS — J449 Chronic obstructive pulmonary disease, unspecified: Secondary | ICD-10-CM | POA: Diagnosis not present

## 2022-08-02 DIAGNOSIS — N1831 Chronic kidney disease, stage 3a: Secondary | ICD-10-CM | POA: Diagnosis not present

## 2022-08-03 DIAGNOSIS — I129 Hypertensive chronic kidney disease with stage 1 through stage 4 chronic kidney disease, or unspecified chronic kidney disease: Secondary | ICD-10-CM | POA: Diagnosis not present

## 2022-08-03 DIAGNOSIS — F32A Depression, unspecified: Secondary | ICD-10-CM | POA: Diagnosis not present

## 2022-08-03 DIAGNOSIS — M1990 Unspecified osteoarthritis, unspecified site: Secondary | ICD-10-CM | POA: Diagnosis not present

## 2022-08-03 DIAGNOSIS — N1831 Chronic kidney disease, stage 3a: Secondary | ICD-10-CM | POA: Diagnosis not present

## 2022-08-03 DIAGNOSIS — J449 Chronic obstructive pulmonary disease, unspecified: Secondary | ICD-10-CM | POA: Diagnosis not present

## 2022-08-03 DIAGNOSIS — I480 Paroxysmal atrial fibrillation: Secondary | ICD-10-CM | POA: Diagnosis not present

## 2022-08-03 DIAGNOSIS — F419 Anxiety disorder, unspecified: Secondary | ICD-10-CM | POA: Diagnosis not present

## 2022-08-03 DIAGNOSIS — E1122 Type 2 diabetes mellitus with diabetic chronic kidney disease: Secondary | ICD-10-CM | POA: Diagnosis not present

## 2022-08-03 DIAGNOSIS — G319 Degenerative disease of nervous system, unspecified: Secondary | ICD-10-CM | POA: Diagnosis not present

## 2022-08-04 DIAGNOSIS — I129 Hypertensive chronic kidney disease with stage 1 through stage 4 chronic kidney disease, or unspecified chronic kidney disease: Secondary | ICD-10-CM | POA: Diagnosis not present

## 2022-08-04 DIAGNOSIS — E1122 Type 2 diabetes mellitus with diabetic chronic kidney disease: Secondary | ICD-10-CM | POA: Diagnosis not present

## 2022-08-04 DIAGNOSIS — J449 Chronic obstructive pulmonary disease, unspecified: Secondary | ICD-10-CM | POA: Diagnosis not present

## 2022-08-04 DIAGNOSIS — G319 Degenerative disease of nervous system, unspecified: Secondary | ICD-10-CM | POA: Diagnosis not present

## 2022-08-04 DIAGNOSIS — I480 Paroxysmal atrial fibrillation: Secondary | ICD-10-CM | POA: Diagnosis not present

## 2022-08-04 DIAGNOSIS — F32A Depression, unspecified: Secondary | ICD-10-CM | POA: Diagnosis not present

## 2022-08-04 DIAGNOSIS — M1990 Unspecified osteoarthritis, unspecified site: Secondary | ICD-10-CM | POA: Diagnosis not present

## 2022-08-04 DIAGNOSIS — N1831 Chronic kidney disease, stage 3a: Secondary | ICD-10-CM | POA: Diagnosis not present

## 2022-08-04 DIAGNOSIS — F419 Anxiety disorder, unspecified: Secondary | ICD-10-CM | POA: Diagnosis not present

## 2022-08-08 DIAGNOSIS — I480 Paroxysmal atrial fibrillation: Secondary | ICD-10-CM | POA: Diagnosis not present

## 2022-08-08 DIAGNOSIS — I129 Hypertensive chronic kidney disease with stage 1 through stage 4 chronic kidney disease, or unspecified chronic kidney disease: Secondary | ICD-10-CM | POA: Diagnosis not present

## 2022-08-08 DIAGNOSIS — F32A Depression, unspecified: Secondary | ICD-10-CM | POA: Diagnosis not present

## 2022-08-08 DIAGNOSIS — J449 Chronic obstructive pulmonary disease, unspecified: Secondary | ICD-10-CM | POA: Diagnosis not present

## 2022-08-08 DIAGNOSIS — E1122 Type 2 diabetes mellitus with diabetic chronic kidney disease: Secondary | ICD-10-CM | POA: Diagnosis not present

## 2022-08-08 DIAGNOSIS — M1990 Unspecified osteoarthritis, unspecified site: Secondary | ICD-10-CM | POA: Diagnosis not present

## 2022-08-08 DIAGNOSIS — F419 Anxiety disorder, unspecified: Secondary | ICD-10-CM | POA: Diagnosis not present

## 2022-08-08 DIAGNOSIS — G319 Degenerative disease of nervous system, unspecified: Secondary | ICD-10-CM | POA: Diagnosis not present

## 2022-08-08 DIAGNOSIS — N1831 Chronic kidney disease, stage 3a: Secondary | ICD-10-CM | POA: Diagnosis not present

## 2022-08-09 ENCOUNTER — Other Ambulatory Visit: Payer: Self-pay | Admitting: Pharmacist

## 2022-08-09 DIAGNOSIS — J449 Chronic obstructive pulmonary disease, unspecified: Secondary | ICD-10-CM | POA: Diagnosis not present

## 2022-08-09 DIAGNOSIS — N1831 Chronic kidney disease, stage 3a: Secondary | ICD-10-CM | POA: Diagnosis not present

## 2022-08-09 DIAGNOSIS — G319 Degenerative disease of nervous system, unspecified: Secondary | ICD-10-CM | POA: Diagnosis not present

## 2022-08-09 DIAGNOSIS — F32A Depression, unspecified: Secondary | ICD-10-CM | POA: Diagnosis not present

## 2022-08-09 DIAGNOSIS — I480 Paroxysmal atrial fibrillation: Secondary | ICD-10-CM | POA: Diagnosis not present

## 2022-08-09 DIAGNOSIS — I129 Hypertensive chronic kidney disease with stage 1 through stage 4 chronic kidney disease, or unspecified chronic kidney disease: Secondary | ICD-10-CM | POA: Diagnosis not present

## 2022-08-09 DIAGNOSIS — F419 Anxiety disorder, unspecified: Secondary | ICD-10-CM | POA: Diagnosis not present

## 2022-08-09 DIAGNOSIS — E1122 Type 2 diabetes mellitus with diabetic chronic kidney disease: Secondary | ICD-10-CM | POA: Diagnosis not present

## 2022-08-09 DIAGNOSIS — M1990 Unspecified osteoarthritis, unspecified site: Secondary | ICD-10-CM | POA: Diagnosis not present

## 2022-08-09 NOTE — Progress Notes (Signed)
08/09/2022 Name: Raymond Hardin MRN: [id number] DOB: 1940-12-07   Raymond Hardin is a 82 y.o. year old male who presented for a telephone visit.   They were referred to the pharmacist by a quality report for assistance in managing  quality metrics: controlling blood pressure (CBP), med adherence cholesterol (MAC), med adherence diabetes (MAD), med adherence hypertension [facility])) .    Subjective:  Care Team: Primary Care Provider: Lavada Mesi ; Next Scheduled Visit: 06/28/22 RN visit for BP check  Medication Access/Adherence  Current Pharmacy:  [place], [place] - [id number] [place] [address] Phone: [phone] Fax: [place] [place] - [address] [place] [address] Phone: [phone] Fax: [phone]   Patient reports affordability concerns with their medications: Yes   Hypertension:  Current medications: amlodipine 2.5mg , valsartan 320mg  daily, metoprolol 50mg  BID  Patient does not have a validated, automated, upper arm home BP cuff - currently checked by home health Current blood pressure readings readings: 143/82  Patient denies hypotensive s/sx including dizziness, lightheadedness.  Patient denies hypertensive symptoms including headache, chest pain, shortness of breath   Tobacco Abuse:  Tobacco Use History: Age when started using tobacco on a daily basis: 82 yr old, about 50 years Number of cigarettes per day 1ppd prior to nicotine patch Smokes first cigarette 30 minutes after waking Does not wake at night to smoke  Triggers include physical: physical habit, patient is around others who smoke  Quit Attempt History: Most recent quit attempt 2 yrs ago Longest time ever been tobacco free 6 months Methods tried in the past include None.  Motivators to quitting include watching older sister succomb to  COPD; barriers include friends smoke    Objective:  Lab Results  Component Value Date   HGBA1C 7.0 (A) 04/27/2022    Lab Results  Component Value Date   CREATININE 1.51 (H) 07/19/2021   BUN 20 07/19/2021   NA 142 07/19/2021   K 4.2 07/19/2021   CL 106 07/19/2021   CO2 27 07/19/2021    Lab Results  Component Value Date   CHOL 172 07/19/2021   HDL 46 07/19/2021   LDLCALC 89 07/19/2021   TRIG 284 (H) 07/19/2021   CHOLHDL 3.7 07/19/2021    Medications Reviewed Today     Reviewed by Darius Bump, [place] (Pharmacist) on 08/09/22 at 1551  Med List Status: <None>   Medication Order Taking? Sig Documenting Provider Last Dose Status Informant  albuterol (VENTOLIN HFA) 108 (90 Base) MCG/ACT inhaler [id number] Yes Inhale 2 puffs into the lungs every 6 (six) hours as needed. Donella Stade, PA-C Taking Active   ALPRAZolam Duanne Moron) 0.5 MG tablet [id number] Yes TAKE 1 TABLET(0.5 MG) BY MOUTH AT BEDTIME AS NEEDED FOR ANXIETY Breeback, Jade L, PA-C Taking Active   amLODipine (NORVASC) 2.5 MG tablet [id number] Yes Take 1 tablet (2.5 mg total) by mouth daily. For blood pressure. Donella Stade, PA-C Taking Active   ARIPiprazole (ABILIFY) 2 MG tablet [id number] Yes TAKE 1 TABLET(2 MG) BY MOUTH DAILY Breeback, Jade L, PA-C Taking Active   atorvastatin (LIPITOR) 20 MG tablet [id number] Yes Take 1 tablet (20 mg total) by mouth daily. Donella Stade, PA-C Taking Active   Blood Glucose Monitoring Suppl DEVI [id number] Yes 1 each by Does not apply route in the morning, at noon, and at bedtime.  May substitute to any manufacturer covered by patient's insurance. Donella Stade, PA-C Taking Active   busPIRone (BUSPAR) 15 MG tablet [id number] Yes Take 1 tablet (15 mg total) by mouth 3 (three) times daily. Donella Stade, PA-C Taking Active   cholecalciferol (VITAMIN D) 1000 units tablet [id number] Yes Take 1,000 Units by mouth daily. [provider] Taking Active   dapagliflozin propanediol  (FARXIGA) 10 MG TABS tablet [id number] Yes Take 1 tablet (10 mg total) by mouth daily. Donella Stade, PA-C Taking Active   escitalopram (LEXAPRO) 20 MG tablet [id number] Yes TAKE ONE TABLET BY MOUTH DAILY FOR MENTAL HEALTH [provider] Taking Active   Fluticasone-Umeclidin-Vilant (TRELEGY ELLIPTA) 100-62.5-25 MCG/ACT AEPB [id number] Yes Inhale 1 puff into the lungs daily. [place] Planas L, PA-C Taking Active   Glucose Blood (BLOOD GLUCOSE TEST STRIPS) STRP [id number] Yes 1 each by In Vitro route in the morning, at noon, and at bedtime. May substitute to any manufacturer covered by patient's insurance. Donella Stade, PA-C Taking Active   Lancet Device MISC [id number] Yes 1 each by Does not apply route in the morning, at noon, and at bedtime. May substitute to any manufacturer covered by patient's insurance. Donella Stade, PA-C Taking Active   Lancets Misc. [place] [id number] Yes 1 each by Does not apply route in the morning, at noon, and at bedtime. May substitute to any manufacturer covered by patient's insurance. Donella Stade, PA-C Taking Active   metFORMIN (GLUCOPHAGE) 1000 MG tablet [id number] Yes Take 1 tablet (1,000 mg total) by mouth 2 (two) times daily with a meal. Breeback, Jade L, PA-C Taking Active   metoprolol tartrate (LOPRESSOR) 50 MG tablet [id number] Yes TAKE ONE TABLET BY MOUTH TWICE A DAY FOR HEART [provider] Taking Active   nicotine (NICODERM CQ) 14 mg/24hr patch [id number] Yes Place 1 patch (14 mg total) onto the skin daily. Donella Stade, PA-C Taking Active   nicotine (NICODERM CQ) 7 mg/24hr patch [id number] Yes Place 1 patch (7 mg total) onto the skin daily. Donella Stade, PA-C Taking Active   sildenafil (REVATIO) 20 MG tablet [id number] Yes TAKE 5 TABLETS BY MOUTH AS NEEDED FOR ERECTILE DYSFUNCTION Breeback, Jade L, PA-C Taking Active   valsartan (DIOVAN) 320 MG tablet [id number] Yes Take 1 tablet (320 mg total) by mouth daily. Appt for refills Breeback,  Jade L, PA-C Taking Active   vortioxetine HBr (TRINTELLIX) 20 MG TABS tablet [id number] Yes Take 1 tablet (20 mg total) by mouth daily. Donella Stade, PA-C Taking Active   XARELTO 20 MG TABS tablet [id number] Yes Take 1 tablet (20 mg total) by mouth daily. Donella Stade, PA-C Taking Active               Assessment/Plan:   Hypertension: - Currently uncontrolled, suspect adherence may be contributing factor, therefore increasing/adding medication may not be optimal response  - Reviewed long term cardiovascular and renal outcomes of uncontrolled blood pressure - Reviewed appropriate blood pressure monitoring technique and reviewed goal blood pressure. Recommended to check home blood pressure and heart rate with home health  - Recommend to continue current medications, reinforced taking all medications regularly, and reminded patient to take blood pressure medicines in the morning before his upcoming PCP appt on 08/16/22 at 10:50am.  Adherence Reporting - Patient is adamant that he takes medications regularly and obtains from [facility] pharmacy. 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 Unsure what medications he is getting.  - Recommend he bring ALL pill bottles to upcoming PCP visit to confirm accuracy & review   Tobacco Abuse - Currently uncontrolled, but patient is making progress  - Provided motivational interviewing to assess tobacco use and strategies for reduction, encouraged use of TWO methods (dual NRT, he states he wishes to try plain hard candy first) - Provided information on 1 800 QUIT NOW support program - For improving medication access, we completed the [employer] Help application in February over the phone. Instructed patient to keep an eye out in the mail for a determination letter. He has not seen any correspondence as of today's  inquiry  Follow Up Plan: 1 month  Larinda Buttery, PharmD Clinical Pharmacist [place] [phone]

## 2022-08-11 DIAGNOSIS — M1990 Unspecified osteoarthritis, unspecified site: Secondary | ICD-10-CM | POA: Diagnosis not present

## 2022-08-11 DIAGNOSIS — I129 Hypertensive chronic kidney disease with stage 1 through stage 4 chronic kidney disease, or unspecified chronic kidney disease: Secondary | ICD-10-CM | POA: Diagnosis not present

## 2022-08-11 DIAGNOSIS — J449 Chronic obstructive pulmonary disease, unspecified: Secondary | ICD-10-CM | POA: Diagnosis not present

## 2022-08-11 DIAGNOSIS — I480 Paroxysmal atrial fibrillation: Secondary | ICD-10-CM | POA: Diagnosis not present

## 2022-08-11 DIAGNOSIS — N1831 Chronic kidney disease, stage 3a: Secondary | ICD-10-CM | POA: Diagnosis not present

## 2022-08-11 DIAGNOSIS — F32A Depression, unspecified: Secondary | ICD-10-CM | POA: Diagnosis not present

## 2022-08-11 DIAGNOSIS — E1122 Type 2 diabetes mellitus with diabetic chronic kidney disease: Secondary | ICD-10-CM | POA: Diagnosis not present

## 2022-08-11 DIAGNOSIS — F419 Anxiety disorder, unspecified: Secondary | ICD-10-CM | POA: Diagnosis not present

## 2022-08-11 DIAGNOSIS — G319 Degenerative disease of nervous system, unspecified: Secondary | ICD-10-CM | POA: Diagnosis not present

## 2022-08-15 DIAGNOSIS — E1122 Type 2 diabetes mellitus with diabetic chronic kidney disease: Secondary | ICD-10-CM | POA: Diagnosis not present

## 2022-08-15 DIAGNOSIS — I129 Hypertensive chronic kidney disease with stage 1 through stage 4 chronic kidney disease, or unspecified chronic kidney disease: Secondary | ICD-10-CM | POA: Diagnosis not present

## 2022-08-15 DIAGNOSIS — F419 Anxiety disorder, unspecified: Secondary | ICD-10-CM | POA: Diagnosis not present

## 2022-08-15 DIAGNOSIS — F32A Depression, unspecified: Secondary | ICD-10-CM | POA: Diagnosis not present

## 2022-08-15 DIAGNOSIS — I480 Paroxysmal atrial fibrillation: Secondary | ICD-10-CM | POA: Diagnosis not present

## 2022-08-15 DIAGNOSIS — N1831 Chronic kidney disease, stage 3a: Secondary | ICD-10-CM | POA: Diagnosis not present

## 2022-08-15 DIAGNOSIS — J449 Chronic obstructive pulmonary disease, unspecified: Secondary | ICD-10-CM | POA: Diagnosis not present

## 2022-08-15 DIAGNOSIS — G319 Degenerative disease of nervous system, unspecified: Secondary | ICD-10-CM | POA: Diagnosis not present

## 2022-08-15 DIAGNOSIS — M1990 Unspecified osteoarthritis, unspecified site: Secondary | ICD-10-CM | POA: Diagnosis not present

## 2022-08-16 ENCOUNTER — Ambulatory Visit (INDEPENDENT_AMBULATORY_CARE_PROVIDER_SITE_OTHER): Payer: Medicare HMO | Admitting: Physician Assistant

## 2022-08-16 ENCOUNTER — Encounter: Payer: Self-pay | Admitting: Physician Assistant

## 2022-08-16 VITALS — BP 140/80 | HR 64 | Ht 70.0 in | Wt 233.0 lb

## 2022-08-16 DIAGNOSIS — E1122 Type 2 diabetes mellitus with diabetic chronic kidney disease: Secondary | ICD-10-CM

## 2022-08-16 DIAGNOSIS — E782 Mixed hyperlipidemia: Secondary | ICD-10-CM

## 2022-08-16 DIAGNOSIS — F3341 Major depressive disorder, recurrent, in partial remission: Secondary | ICD-10-CM | POA: Diagnosis not present

## 2022-08-16 DIAGNOSIS — N1831 Chronic kidney disease, stage 3a: Secondary | ICD-10-CM

## 2022-08-16 DIAGNOSIS — J4489 Other specified chronic obstructive pulmonary disease: Secondary | ICD-10-CM

## 2022-08-16 DIAGNOSIS — I10 Essential (primary) hypertension: Secondary | ICD-10-CM | POA: Diagnosis not present

## 2022-08-16 DIAGNOSIS — Z09 Encounter for follow-up examination after completed treatment for conditions other than malignant neoplasm: Secondary | ICD-10-CM | POA: Diagnosis not present

## 2022-08-16 DIAGNOSIS — T796XXD Traumatic ischemia of muscle, subsequent encounter: Secondary | ICD-10-CM | POA: Diagnosis not present

## 2022-08-16 DIAGNOSIS — I480 Paroxysmal atrial fibrillation: Secondary | ICD-10-CM

## 2022-08-16 DIAGNOSIS — M6282 Rhabdomyolysis: Secondary | ICD-10-CM | POA: Diagnosis not present

## 2022-08-16 LAB — CBC WITH DIFFERENTIAL/PLATELET
Basophils Relative: 1.1 %
Eosinophils Absolute: 166 cells/uL (ref 15–500)
HCT: 44.5 % (ref 38.5–50.0)
MCV: 96.1 fL (ref 80.0–100.0)
Neutro Abs: 4665 cells/uL (ref 1500–7800)
Platelets: 160 10*3/uL (ref 140–400)
RBC: 4.63 10*6/uL (ref 4.20–5.80)
Total Lymphocyte: 33.5 %

## 2022-08-16 LAB — POCT GLYCOSYLATED HEMOGLOBIN (HGB A1C): Hemoglobin A1C: 6.1 % — AB (ref 4.0–5.6)

## 2022-08-16 MED ORDER — VALSARTAN 320 MG PO TABS: 320.0000 mg | ORAL_TABLET | Freq: Every day | ORAL | 1 refills | Status: DC

## 2022-08-16 MED ORDER — METOPROLOL TARTRATE 50 MG PO TABS
ORAL_TABLET | ORAL | 3 refills | Status: DC
Start: 2022-08-16 — End: 2023-02-13

## 2022-08-16 MED ORDER — TRELEGY ELLIPTA 100-62.5-25 MCG/ACT IN AEPB: 1.0000 | INHALATION_SPRAY | Freq: Every day | RESPIRATORY_TRACT | 11 refills | Status: DC

## 2022-08-16 MED ORDER — XARELTO 20 MG PO TABS: 20.0000 mg | ORAL_TABLET | Freq: Every day | ORAL | 3 refills | Status: DC

## 2022-08-16 MED ORDER — DAPAGLIFLOZIN PROPANEDIOL 10 MG PO TABS
10.0000 mg | ORAL_TABLET | Freq: Every day | ORAL | 0 refills | Status: DC
Start: 2022-08-16 — End: 2023-06-12

## 2022-08-16 MED ORDER — VORTIOXETINE HBR 20 MG PO TABS: 20.0000 mg | ORAL_TABLET | Freq: Every day | ORAL | 1 refills | Status: DC

## 2022-08-16 MED ORDER — METFORMIN HCL 1000 MG PO TABS: 1000.0000 mg | ORAL_TABLET | Freq: Two times a day (BID) | ORAL | 1 refills | Status: DC

## 2022-08-16 NOTE — Progress Notes (Signed)
Established Patient Office Visit  Subjective   Patient ID: Raymond Hardin, male    DOB: 01-31-1941  Age: 82 y.o. MRN: [id number]  Chief Complaint  Patient presents with   Follow-up    Last a1c    HPI Pt is a 82 yo obese male with T2DM, HLD, HTN, PAF, COPD, MDD, bilateral knee OA, chronic low back pain who presents to the clinic for follow up.   He fell on 07/12/2022 and was on the ground for at least 2 hours because he could not get up.  Went to ED and CK was 1700. CT of head and spine were negative. No signs of internal bleeding. Pt is on Xarelto. He was given IV fluids and discharged home. Home PT was ordered and patient has this twice a week. He does feel like this is helping his mobility. He has walker and cane. He does not always use it.   Mood is good.   Breathing is stable. Using Trelegy daily.   Taking diovan/norvasc/metoprolol daily for BP. Not checking at home.   On metformin and farxiga for DM.   Pt continues to smoke but is trying to "cut back".   .. Active Ambulatory Problems    Diagnosis Date Noted   Erectile dysfunction 11/04/2011   Tobacco abuse 12/12/2012   Depression 12/12/2012   Primary osteoarthritis of right knee 04/24/2013   Vitamin D deficiency 08/13/2013   Pre-diabetes 08/16/2013   Status post left knee replacement 08/13/2014   Anxiety state 08/13/2014   Essential hypertension, benign 08/13/2014   Hyperlipidemia 02/24/2015   Hypertriglyceridemia 02/24/2015   CKD (chronic kidney disease), stage III 12/07/2015   S/P total knee replacement, right 01/31/2017   Bilateral cataracts 01/05/2018   Posterior vitreous detachment of left eye 04/09/2018   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   S/P bilateral cataract extraction 05/25/2018   Recurrent major depressive disorder, in partial remission 06/19/2018   Skipped heart beats 09/17/2018   Strong pulse 09/17/2018   Palpitation 09/19/2018   Acute bilateral low back pain without sciatica 01/28/2019   PAF  (paroxysmal atrial fibrillation) 05/13/2019   Low blood potassium 05/13/2019   Acute encephalopathy 05/16/2019   COPD (chronic obstructive pulmonary disease) with chronic bronchitis 05/16/2019   Hypotension 05/29/2019   History of COVID-19 06/11/2019   Mucopurulent chronic bronchitis 06/11/2019   SOB (shortness of breath) on exertion 10/02/2019   Cough 11/04/2019   Type 2 diabetes mellitus with stage 3a chronic kidney disease, without long-term current use of insulin 09/15/2020   Toenail fungus 04/27/2022   Polyneuropathy associated with underlying disease 04/27/2022   Tobacco dependence 05/24/2022   History of influenza 05/24/2022   Traumatic rhabdomyolysis 08/22/2022   Resolved Ambulatory Problems    Diagnosis Date Noted   Hypertension, essential, benign 11/04/2011   Pneumonia of both lungs due to infectious organism 05/16/2019   Past Medical History:  Diagnosis Date   COPD (chronic obstructive pulmonary disease)    COVID-19 03/2019   Diabetes mellitus without complication    Hypertension    Insomnia 2005   Osteoarthritis 2000   Paroxysmal A-fib    Pneumonia      ROS See HPI.    Objective:     BP (!) 140/80   Pulse 64   Ht  (1.778 m)   Wt 233 lb (105.7 kg)   SpO2 99%   BMI 33.43 kg/m  BP Readings from Last 3 Encounters:  08/16/22 (!) 140/80  07/07/22 (!) 143/64  06/07/22 (!) 160/87  Wt Readings from Last 3 Encounters:  08/16/22 233 lb (105.7 kg)  06/07/22 239 lb (108.4 kg)  05/24/22 242 lb (109.8 kg)      Physical Exam Constitutional:      Appearance: Normal appearance. He is obese.  HENT:     Head: Normocephalic.  Cardiovascular:     Rate and Rhythm: Normal rate and regular rhythm.     Pulses: Normal pulses.  Pulmonary:     Effort: Pulmonary effort is normal.     Breath sounds: Normal breath sounds.  Musculoskeletal:     Cervical back: Neck supple.     Right lower leg: No edema.     Left lower leg: No edema.  Neurological:      General: No focal deficit present.     Mental Status: He is alert and oriented to person, place, and time.  Psychiatric:        Mood and Affect: Mood normal.    .    04/27/2022   10:45 AM 08/24/2021    3:21 PM 07/19/2021    2:07 PM 07/14/2021    2:26 PM 09/15/2020    1:36 PM  Depression screen PHQ 2/9  Decreased Interest 0 0 1 0 0  Down, Depressed, Hopeless 0 1 2 0 0  PHQ - 2 Score 0 1 3 0 0  Altered sleeping  0 3 0   Tired, decreased energy  1 0 0   Change in appetite  0 1 0   Feeling bad or failure about yourself   0 3 0   Trouble concentrating  0 1 0   Moving slowly or fidgety/restless  1 2 0   Suicidal thoughts  0 1 0   PHQ-9 Score  3 14 0   Difficult doing work/chores  Somewhat difficult Somewhat difficult Not difficult at all    .Marland Kitchen    08/24/2021    3:22 PM 07/19/2021    2:08 PM 07/06/2020   10:56 AM 10/01/2019    2:47 PM  GAD 7 : Generalized Anxiety Score  Nervous, Anxious, on Edge 0 2 0 0  Control/stop worrying 1 2 2 2   Worry too much - different things 1 2 2 1   Trouble relaxing 0 2 0 2  Restless 0 2 0 2  Easily annoyed or irritable 0 2 0 2  Afraid - awful might happen 0 0 0 0  Total GAD 7 Score 2 12 4 9   Anxiety Difficulty Not difficult at all Somewhat difficult Not difficult at all Somewhat difficult      Results for orders placed or performed in visit on 08/16/22  Lipid Panel w/reflex Direct LDL  Result Value Ref Range   Cholesterol 88 <200 mg/dL   HDL 47 > OR = 40 mg/dL   Triglycerides 219 <758 mg/dL   LDL Cholesterol (Calc) 19 mg/dL (calc)   Total CHOL/HDL Ratio 1.9 <5.0 (calc)   Non-HDL Cholesterol (Calc) 41 <832 mg/dL (calc)  COMPLETE METABOLIC PANEL WITH GFR  Result Value Ref Range   Glucose, Bld 99 65 - 99 mg/dL   BUN 15 7 - 25 mg/dL   Creat 5.49 (H) 8.26 - 1.28 mg/dL   eGFR 52 (L) > OR = 60 mL/min/1.34m2   BUN/Creatinine Ratio 11 6 - 22 (calc)   Sodium 144 135 - 146 mmol/L   Potassium 4.3 3.5 - 5.3 mmol/L   Chloride 109 98 - 110 mmol/L   CO2  26 20 - 32 mmol/L   Calcium  8.6 8.6 - 10.3 mg/dL   Total Protein 6.5 6.1 - 8.1 g/dL   Albumin 4.0 3.6 - 5.1 g/dL   Globulin 2.5 1.9 - 3.7 g/dL (calc)   AG Ratio 1.6 1.0 - 2.5 (calc)   Total Bilirubin 0.3 0.2 - 1.2 mg/dL   Alkaline phosphatase (APISO) 56 35 - 144 U/L   AST 12 10 - 35 U/L   ALT 7 (L) 9 - 46 U/L  CK (Creatine Kinase)  Result Value Ref Range   Total CK 59 44 - 196 U/L  CBC with Differential/Platelet  Result Value Ref Range   WBC 8.3 3.8 - 10.8 Thousand/uL   RBC 4.63 4.20 - 5.80 Million/uL   Hemoglobin 14.7 13.2 - 17.1 g/dL   HCT 16.1 09.6 - 04.5 %   MCV 96.1 80.0 - 100.0 fL   MCH 31.7 27.0 - 33.0 pg   MCHC 33.0 32.0 - 36.0 g/dL   RDW 40.9 81.1 - 91.4 %   Platelets 160 140 - 400 Thousand/uL   MPV 12.7 (H) 7.5 - 12.5 fL   Neutro Abs 4,665 1,500 - 7,800 cells/uL   Lymphs Abs 2,781 850 - 3,900 cells/uL   Absolute Monocytes 598 200 - 950 cells/uL   Eosinophils Absolute 166 15 - 500 cells/uL   Basophils Absolute 91 0 - 200 cells/uL   Neutrophils Relative % 56.2 %   Total Lymphocyte 33.5 %   Monocytes Relative 7.2 %   Eosinophils Relative 2.0 %   Basophils Relative 1.1 %  POCT HgB A1C  Result Value Ref Range   Hemoglobin A1C 6.1 (A) 4.0 - 5.6 %   HbA1c POC (<> result, manual entry)     HbA1c, POC (prediabetic range)     HbA1c, POC (controlled diabetic range)         Assessment & Plan:  Marland KitchenMarland KitchenCollen was seen today for follow-up.  Diagnoses and all orders for this visit:  Hospital discharge follow-up -     CK (Creatine Kinase)  Type 2 diabetes mellitus with stage 3a chronic kidney disease, without long-term current use of insulin -     POCT HgB A1C -     metFORMIN (GLUCOPHAGE) 1000 MG tablet; Take 1 tablet (1,000 mg total) by mouth 2 (two) times daily with a meal. -     COMPLETE METABOLIC PANEL WITH GFR -     CBC with Differential/Platelet -     dapagliflozin propanediol (FARXIGA) 10 MG TABS tablet; Take 1 tablet (10 mg total) by mouth daily.  COPD (chronic  obstructive pulmonary disease) with chronic bronchitis -     Fluticasone-Umeclidin-Vilant (TRELEGY ELLIPTA) 100-62.5-25 MCG/ACT AEPB; Inhale 1 puff into the lungs daily.  Recurrent major depressive disorder, in partial remission -     vortioxetine HBr (TRINTELLIX) 20 MG TABS tablet; Take 1 tablet (20 mg total) by mouth daily.  Essential hypertension, benign -     valsartan (DIOVAN) 320 MG tablet; Take 1 tablet (320 mg total) by mouth daily. For blood pressure. -     metoprolol tartrate (LOPRESSOR) 50 MG tablet; TAKE ONE TABLET BY MOUTH TWICE A DAY FOR HEART -     COMPLETE METABOLIC PANEL WITH GFR  PAF (paroxysmal atrial fibrillation) -     metoprolol tartrate (LOPRESSOR) 50 MG tablet; TAKE ONE TABLET BY MOUTH TWICE A DAY FOR HEART -     CBC with Differential/Platelet -     XARELTO 20 MG TABS tablet; Take 1 tablet (20 mg total) by mouth daily.  Mixed  hyperlipidemia -     Lipid Panel w/reflex Direct LDL  Traumatic rhabdomyolysis, subsequent encounter -     CK (Creatine Kinase) -     CBC with Differential/Platelet    A1C to goal Continue metformin and farxiga Current smoker declines cessation counseling but is trying to "cut back" Needs eye exam and Medicare Wellness CK and CMP ordered to follow up on rhabdomyolysis and kidney function Mood good trintellix refilled xanax no more than 30 a month      Return in about 3 months (around 11/15/2022).    Tandy Gaw, PA-C

## 2022-08-17 DIAGNOSIS — I480 Paroxysmal atrial fibrillation: Secondary | ICD-10-CM | POA: Diagnosis not present

## 2022-08-17 DIAGNOSIS — F419 Anxiety disorder, unspecified: Secondary | ICD-10-CM | POA: Diagnosis not present

## 2022-08-17 DIAGNOSIS — F32A Depression, unspecified: Secondary | ICD-10-CM | POA: Diagnosis not present

## 2022-08-17 DIAGNOSIS — N1831 Chronic kidney disease, stage 3a: Secondary | ICD-10-CM | POA: Diagnosis not present

## 2022-08-17 DIAGNOSIS — I129 Hypertensive chronic kidney disease with stage 1 through stage 4 chronic kidney disease, or unspecified chronic kidney disease: Secondary | ICD-10-CM | POA: Diagnosis not present

## 2022-08-17 DIAGNOSIS — J449 Chronic obstructive pulmonary disease, unspecified: Secondary | ICD-10-CM | POA: Diagnosis not present

## 2022-08-17 DIAGNOSIS — E1122 Type 2 diabetes mellitus with diabetic chronic kidney disease: Secondary | ICD-10-CM | POA: Diagnosis not present

## 2022-08-17 DIAGNOSIS — M1990 Unspecified osteoarthritis, unspecified site: Secondary | ICD-10-CM | POA: Diagnosis not present

## 2022-08-17 DIAGNOSIS — G319 Degenerative disease of nervous system, unspecified: Secondary | ICD-10-CM | POA: Diagnosis not present

## 2022-08-17 LAB — CBC WITH DIFFERENTIAL/PLATELET
Absolute Monocytes: 598 cells/uL (ref 200–950)
Basophils Absolute: 91 cells/uL (ref 0–200)
Eosinophils Relative: 2 %
Hemoglobin: 14.7 g/dL (ref 13.2–17.1)
Lymphs Abs: 2781 cells/uL (ref 850–3900)
MCH: 31.7 pg (ref 27.0–33.0)
MCHC: 33 g/dL (ref 32.0–36.0)
MPV: 12.7 fL — ABNORMAL HIGH (ref 7.5–12.5)
Monocytes Relative: 7.2 %
Neutrophils Relative %: 56.2 %
RDW: 13 % (ref 11.0–15.0)
WBC: 8.3 10*3/uL (ref 3.8–10.8)

## 2022-08-17 LAB — LIPID PANEL W/REFLEX DIRECT LDL
Cholesterol: 88 mg/dL (ref <200)
HDL: 47 mg/dL (ref >=40)
LDL Cholesterol (Calc): 19 mg/dL (calc)
Non-HDL Cholesterol (Calc): 41 mg/dL (calc) (ref <130)
Total CHOL/HDL Ratio: 1.9 (calc) (ref <5.0)
Triglycerides: 140 mg/dL (ref <150)

## 2022-08-17 LAB — COMPLETE METABOLIC PANEL WITH GFR
AG Ratio: 1.6 (calc) (ref 1.0–2.5)
ALT: 7 U/L — ABNORMAL LOW (ref 9–46)
AST: 12 U/L (ref 10–35)
Albumin: 4 g/dL (ref 3.6–5.1)
Alkaline phosphatase (APISO): 56 U/L (ref 35–144)
BUN/Creatinine Ratio: 11 (calc) (ref 6–22)
BUN: 15 mg/dL (ref 7–25)
CO2: 26 mmol/L (ref 20–32)
Calcium: 8.6 mg/dL (ref 8.6–10.3)
Chloride: 109 mmol/L (ref 98–110)
Creat: 1.39 mg/dL — ABNORMAL HIGH (ref 0.70–1.28)
Globulin: 2.5 g/dL (calc) (ref 1.9–3.7)
Glucose, Bld: 99 mg/dL (ref 65–99)
Potassium: 4.3 mmol/L (ref 3.5–5.3)
Sodium: 144 mmol/L (ref 135–146)
Total Bilirubin: 0.3 mg/dL (ref 0.2–1.2)
Total Protein: 6.5 g/dL (ref 6.1–8.1)
eGFR: 52 mL/min/{1.73_m2} — ABNORMAL LOW (ref >=60)

## 2022-08-17 LAB — CK: Total CK: 59 U/L (ref 44–196)

## 2022-08-17 NOTE — Progress Notes (Signed)
Spoke with pt who states he's has been taking 2 tablets by mouth daily of his amlodipine , pt  states he is out of medication and would like a refill

## 2022-08-17 NOTE — Progress Notes (Signed)
[name],   Random glucose looks good.  Kidney function did improve some CK back to normal.  Cholesterol is very low.

## 2022-08-18 ENCOUNTER — Ambulatory Visit: Payer: Self-pay

## 2022-08-18 DIAGNOSIS — I129 Hypertensive chronic kidney disease with stage 1 through stage 4 chronic kidney disease, or unspecified chronic kidney disease: Secondary | ICD-10-CM | POA: Diagnosis not present

## 2022-08-18 DIAGNOSIS — F419 Anxiety disorder, unspecified: Secondary | ICD-10-CM | POA: Diagnosis not present

## 2022-08-18 DIAGNOSIS — I480 Paroxysmal atrial fibrillation: Secondary | ICD-10-CM | POA: Diagnosis not present

## 2022-08-18 DIAGNOSIS — N1831 Chronic kidney disease, stage 3a: Secondary | ICD-10-CM | POA: Diagnosis not present

## 2022-08-18 DIAGNOSIS — J449 Chronic obstructive pulmonary disease, unspecified: Secondary | ICD-10-CM | POA: Diagnosis not present

## 2022-08-18 DIAGNOSIS — G319 Degenerative disease of nervous system, unspecified: Secondary | ICD-10-CM | POA: Diagnosis not present

## 2022-08-18 DIAGNOSIS — F32A Depression, unspecified: Secondary | ICD-10-CM | POA: Diagnosis not present

## 2022-08-18 DIAGNOSIS — E1122 Type 2 diabetes mellitus with diabetic chronic kidney disease: Secondary | ICD-10-CM | POA: Diagnosis not present

## 2022-08-18 DIAGNOSIS — M1990 Unspecified osteoarthritis, unspecified site: Secondary | ICD-10-CM | POA: Diagnosis not present

## 2022-08-18 NOTE — Patient Outreach (Signed)
  Care Coordination   Follow Up Visit Note   08/18/2022 Name: Raymond Hardin MRN: [id number] DOB: 07/25/1940  Raymond Hardin is a 82 y.o. year old male who sees Raymond Hardin, Raymond Hardin, Raymond Hardin for primary care. I spoke with  Raymond Hardin by phone today.  What matters to the patients health and wellness today?  Raymond Hardin reports he is doing well. He states he is still active with home health therapy and states they informed him he is doing good. He reports blood sugar ranges 98-144, and is checked twice a day. He denies any hypoglycemic episodes. He reports he is also checking his blood pressure and last reading 130/82. He denies any falls within the past month. He states he knows how to get in touch with his providers. And states he has transportation. He denies any questions or concerns at this time. He states, "I am feeling better". Patient to contact RNCM if care coordination needs in the future. Contact number provided.  Goals Addressed             This Visit's Progress    COMPLETED: Care Coordination Activities       Interventions Today    Flowsheet Row Most Recent Value  Chronic Disease   Chronic disease during today's visit Diabetes, Hypertension (HTN)  General Interventions   General Interventions Discussed/Reviewed General Interventions Reviewed, Doctor Visits  Doctor Visits Discussed/Reviewed Doctor Visits Reviewed, PCP  PCP/Specialist Visits Compliance with follow-up visit  [reviewed upcoming scheduled appointments]  Exercise Interventions   Exercise Discussed/Reviewed Exercise Discussed  [advised to continue to work with Home health therapist and perform the recommended exercises.]  Education Interventions   Education Provided Provided Education  Provided Verbal Education On When to see the doctor, Exercise  [discussed the importance of exercise, nutrition, taking medications as recommended and following up with providers in overall management of health]  Nutrition  Interventions   Nutrition Discussed/Reviewed Nutrition Reviewed  Pharmacy Interventions   Pharmacy Dicussed/Reviewed Pharmacy Topics Reviewed  Safety Interventions   Safety Discussed/Reviewed Safety Reviewed  [[facility] and states will follow up re: life alert system at that time.]           SDOH assessments and interventions completed:  No  Care Coordination Interventions:  Yes, provided   Follow up plan: No further intervention required.   Encounter Outcome:  Pt. Visit Completed    Raymond Sheriff, RN, MSN, BSN, CCM [facility] Care Coordinator [phone]

## 2022-08-18 NOTE — Patient Instructions (Signed)
Visit Information  Thank you for taking time to visit with me today. Please don't hesitate to contact me if I can be of assistance to you.   Following are the goals we discussed today:   Goals Addressed             This Visit's Progress    COMPLETED: Care Coordination Activities       Interventions Today    Flowsheet Row Most Recent Value  Chronic Disease   Chronic disease during today's visit Diabetes, Hypertension (HTN)  General Interventions   General Interventions Discussed/Reviewed General Interventions Reviewed, Doctor Visits  Doctor Visits Discussed/Reviewed Doctor Visits Reviewed, PCP  PCP/Specialist Visits Compliance with follow-up visit  [reviewed upcoming scheduled appointments]  Exercise Interventions   Exercise Discussed/Reviewed Exercise Discussed  [advised to continue to work with Home health therapist and perform the recommended exercises.]  Education Interventions   Education Provided Provided Education  Provided Verbal Education On When to see the doctor, Exercise  [discussed the importance of exercise, nutrition, taking medications as recommended and following up with providers in overall management of health]  Nutrition Interventions   Nutrition Discussed/Reviewed Nutrition Reviewed  Pharmacy Interventions   Pharmacy Dicussed/Reviewed Pharmacy Topics Reviewed  Safety Interventions   Safety Discussed/Reviewed Safety Reviewed  [[facility] and states will follow up re: life alert system at that time.]           If you are experiencing a Mental Health or Behavioral Health Crisis or need someone to talk to, please call the Suicide and Crisis Lifeline: 45  Kathyrn Sheriff, RN, MSN, BSN, CCM [facility] Care Coordinator [phone]

## 2022-08-22 ENCOUNTER — Ambulatory Visit (INDEPENDENT_AMBULATORY_CARE_PROVIDER_SITE_OTHER): Payer: Medicare HMO | Admitting: Physician Assistant

## 2022-08-22 ENCOUNTER — Encounter: Payer: Self-pay | Admitting: Physician Assistant

## 2022-08-22 DIAGNOSIS — Z0000 Encounter for general adult medical examination without abnormal findings: Secondary | ICD-10-CM

## 2022-08-22 DIAGNOSIS — T796XXA Traumatic ischemia of muscle, initial encounter: Secondary | ICD-10-CM | POA: Insufficient documentation

## 2022-08-22 DIAGNOSIS — F17200 Nicotine dependence, unspecified, uncomplicated: Secondary | ICD-10-CM

## 2022-08-22 NOTE — Patient Instructions (Addendum)
MEDICARE ANNUAL WELLNESS VISIT Health Maintenance Summary and Written Plan of Care  [name] ,  Thank you for allowing me to perform your Medicare Annual Wellness Visit and for your ongoing commitment to your health.   Health Maintenance & Immunization History Health Maintenance  Topic Date Due   OPHTHALMOLOGY EXAM  08/22/2022 (Originally 11/26/1950)   COVID-19 Vaccine (6 - 2023-24 season) 09/01/2022 (Originally 06/11/2022)   Lung Cancer Screening  04/28/2023 (Originally 11/26/1990)   INFLUENZA VACCINE  12/08/2022   HEMOGLOBIN A1C  02/15/2023   Diabetic kidney evaluation - Urine ACR  04/28/2023   FOOT EXAM  04/28/2023   Diabetic kidney evaluation - eGFR measurement  08/16/2023   Medicare Annual Wellness (AWV)  08/22/2023   DTaP/Tdap/Td (5 - Td or Tdap) 06/30/2030   Pneumonia Vaccine 38+ Years old  Completed   Hepatitis C Screening  Completed   Zoster Vaccines- Shingrix  Completed   HPV VACCINES  Aged Out   COLONOSCOPY (Pts 45-66yrs Insurance coverage will need to be confirmed)  Discontinued   Immunization History  Administered Date(s) Administered   Covid-19, Mrna,Vaccine(Spikevax)3yrs and older 04/16/2022   Fluad Quad(high Dose 65+) 01/25/2019, 05/15/2020, 04/16/2021   Influenza, High Dose Seasonal PF 02/27/2009   Influenza,inj,Quad PF,6+ Mos 01/23/2013, 02/23/2015, 03/01/2016, 01/31/2017, 01/05/2018   Influenza-Unspecified 03/09/2004, 04/20/2005, 04/25/2006, 05/24/2007, 02/08/2008, 03/23/2010, 02/02/2011, 03/09/2012, 01/07/2013, 05/15/2020, 04/16/2022   Janssen (J&J) SARS-COV-2 Vaccination 08/16/2019, 05/29/2020   Moderna Covid-19 Vaccine Bivalent Booster 5yrs & up 04/16/2021   Moderna Sars-Covid-2 Vaccination 12/18/2020   Pneumococcal Conjugate-13 08/13/2014   Pneumococcal Polysaccharide-23 07/01/2011, 04/16/2013, 11/11/2020   Pneumococcal-Unspecified 05/07/2004   Td 06/30/2020   Td (Adult), 2 Lf Tetanus Toxid, Preservative Free 06/30/2020   Tdap 05/10/2007, 01/05/2018    Zoster Recombinat (Shingrix) 06/30/2020, 09/30/2020    These are the patient goals that we discussed:  Goals Addressed               This Visit's Progress     Patient Stated (pt-stated)        Patient stated he would like to continue to stay healthy.         This is a list of Health Maintenance Items that are overdue or due now:  Diabetic eye exam Lung cancer screening  Orders/Referrals Placed Today: Orders Placed This Encounter  Procedures   Ambulatory Referral for Lung Cancer Scre    Referral Priority:   Routine    Referral Type:   Consultation    Referral Reason:   Specialty Services Required    Number of Visits Requested:   1   (Contact our referral department at [phone] if you have not spoken with someone about your referral appointment within the next 5 days)    Follow-up Plan Follow-up with Jomarie Longs, PA-C as planned Medicare wellness visit in one year.  AVS printed and mailed to the patient.      Health Maintenance, Male Adopting a healthy lifestyle and getting preventive care are important in promoting health and wellness. Ask your health care provider about: The right schedule for you to have regular tests and exams. Things you can do on your own to prevent diseases and keep yourself healthy. What should I know about diet, weight, and exercise? Eat a healthy diet  Eat a diet that includes plenty of vegetables, fruits, low-fat dairy products, and lean protein. Do not eat a lot of foods that are high in solid fats, added sugars, or sodium. Maintain a healthy weight Body mass index (BMI) is  a measurement that can be used to identify possible weight problems. It estimates body fat based on height and weight. Your health care provider can help determine your BMI and help you achieve or maintain a healthy weight. Get regular exercise Get regular exercise. This is one of the most important things you can do for your health. Most adults  should: Exercise for at least 150 minutes each week. The exercise should increase your heart rate and make you sweat (moderate-intensity exercise). Do strengthening exercises at least twice a week. This is in addition to the moderate-intensity exercise. Spend less time sitting. Even light physical activity can be beneficial. Watch cholesterol and blood lipids Have your blood tested for lipids and cholesterol at 80 years of age, then have this test every 5 years. You may need to have your cholesterol levels checked more often if: Your lipid or cholesterol levels are high. You are older than 80 years of age. You are at high risk for heart disease. What should I know about cancer screening? Many types of cancers can be detected early and may often be prevented. Depending on your health history and family history, you may need to have cancer screening at various ages. This may include screening for: Colorectal cancer. Prostate cancer. Skin cancer. Lung cancer. What should I know about heart disease, diabetes, and high blood pressure? Blood pressure and heart disease High blood pressure causes heart disease and increases the risk of stroke. This is more likely to develop in people who have high blood pressure readings or are overweight. Talk with your health care provider about your target blood pressure readings. Have your blood pressure checked: Every 3-5 years if you are 81-61 years of age. Every year if you are 14 years old or older. If you are between the ages of 50 and 52 and are a current or former smoker, ask your health care provider if you should have a one-time screening for abdominal aortic aneurysm (AAA). Diabetes Have regular diabetes screenings. This checks your fasting blood sugar level. Have the screening done: Once every three years after age 89 if you are at a normal weight and have a low risk for diabetes. More often and at a younger age if you are overweight or have a high  risk for diabetes. What should I know about preventing infection? Hepatitis B If you have a higher risk for hepatitis B, you should be screened for this virus. Talk with your health care provider to find out if you are at risk for hepatitis B infection. Hepatitis C Blood testing is recommended for: Everyone born from [birth year] through 1965. Anyone with known risk factors for hepatitis C. Sexually transmitted infections (STIs) You should be screened each year for STIs, including gonorrhea and chlamydia, if: You are sexually active and are younger than 80 years of age. You are older than 80 years of age and your health care provider tells you that you are at risk for this type of infection. Your sexual activity has changed since you were last screened, and you are at increased risk for chlamydia or gonorrhea. Ask your health care provider if you are at risk. Ask your health care provider about whether you are at high risk for HIV. Your health care provider may recommend a prescription medicine to help prevent HIV infection. If you choose to take medicine to prevent HIV, you should first get tested for HIV. You should then be tested every 3 months for as long as you are  taking the medicine. Follow these instructions at home: Alcohol use Do not drink alcohol if your health care provider tells you not to drink. If you drink alcohol: Limit how much you have to 0-2 drinks a day. Know how much alcohol is in your drink. In the [place] one drink equals one 12 oz bottle of beer (355 mL), one 5 oz glass of wine (148 mL), or one 1 oz glass of hard liquor (44 mL). Lifestyle Do not use any products that contain nicotine or tobacco. These products include cigarettes, chewing tobacco, and vaping devices, such as e-cigarettes. If you need help quitting, ask your health care provider. Do not use street drugs. Do not share needles. Ask your health care provider for help if you need support or information about  quitting drugs. General instructions Schedule regular health, dental, and eye exams. Stay current with your vaccines. Tell your health care provider if: You often feel depressed. You have ever been abused or do not feel safe at home. Summary Adopting a healthy lifestyle and getting preventive care are important in promoting health and wellness. Follow your health care provider's instructions about healthy diet, exercising, and getting tested or screened for diseases. Follow your health care provider's instructions on monitoring your cholesterol and blood pressure. This information is not intended to replace advice given to you by your health care provider. Make sure you discuss any questions you have with your health care provider. Document Revised: 09/14/2020 Document Reviewed: 09/14/2020 Elsevier Patient Education  2023 [employer].

## 2022-08-22 NOTE — Progress Notes (Signed)
MEDICARE ANNUAL WELLNESS VISIT  08/22/2022  Telephone Visit Disclaimer This Medicare AWV was conducted by telephone due to national recommendations for restrictions regarding the COVID-19 Pandemic (e.g. social distancing).  I verified, using two identifiers, that I am speaking with Raymond Hardin or their authorized healthcare agent. I discussed the limitations, risks, security, and privacy concerns of performing an evaluation and management service by telephone and the potential availability of an in-person appointment in the future. The patient expressed understanding and agreed to proceed.  Location of Patient: Home Location of Provider (nurse):  In the office.  Subjective:    Raymond Hardin is a 82 y.o. male patient of Raymond Hardin, Raymond Cobb, PA-C who had a Medicare Annual Wellness Visit today via telephone. [name] is Retired and lives alone. he has 1 child. he reports that he is socially active and does interact with friends/family regularly. he is minimally physically active and enjoys watching television.  Patient Care Team: Nolene Ebbs as PCP - General (Family Medicine)     08/22/2022    2:09 PM 07/14/2021    2:25 PM 06/08/2020    9:12 AM 02/04/2019    1:57 PM 01/22/2018    1:22 PM  Advanced Directives  Does Patient Have a Medical Advance Directive? No No Yes No No  Type of Best boy of [place];Living will    Does patient want to make changes to medical advance directive?   No - Patient declined    Copy of Healthcare Power of Attorney in Chart?   No - copy requested    Would patient like information on creating a medical advance directive? No - Patient declined No - Patient declined  No - Patient declined Yes (MAU/Ambulatory/Procedural Areas - Information given)    Hospital Utilization Over the Past 12 Months: # of hospitalizations or ER visits: 4 # of surgeries: 0  Review of Systems    Patient reports that his overall health is better  compared to last year.  History obtained from chart review and the patient  Patient Reported Readings (BP, Pulse, CBG, Weight, etc) none  Pain Assessment Pain : No/denies pain     Current Medications & Allergies (verified) Allergies as of 08/22/2022   No Known Allergies      Medication List        Accurate as of August 22, 2022  2:25 PM. If you have any questions, ask your nurse or doctor.          albuterol 108 (90 Base) MCG/ACT inhaler Commonly known as: VENTOLIN HFA Inhale 2 puffs into the lungs every 6 (six) hours as needed.   ALPRAZolam 0.5 MG tablet Commonly known as: XANAX TAKE 1 TABLET(0.5 MG) BY MOUTH AT BEDTIME AS NEEDED FOR ANXIETY   amLODipine 2.5 MG tablet Commonly known as: NORVASC Take 1 tablet (2.5 mg total) by mouth daily. For blood pressure.   ARIPiprazole 2 MG tablet Commonly known as: ABILIFY TAKE 1 TABLET(2 MG) BY MOUTH DAILY   atorvastatin 20 MG tablet Commonly known as: LIPITOR Take 1 tablet (20 mg total) by mouth daily.   Blood Glucose Monitoring Suppl Devi 1 each by Does not apply route in the morning, at noon, and at bedtime. May substitute to any manufacturer covered by patient's insurance.   BLOOD GLUCOSE TEST STRIPS Strp 1 each by In Vitro route in the morning, at noon, and at bedtime. May substitute to any manufacturer covered by patient's insurance.   busPIRone  15 MG tablet Commonly known as: BUSPAR Take 1 tablet (15 mg total) by mouth 3 (three) times daily.   cholecalciferol 1000 units tablet Commonly known as: VITAMIN D Take 1,000 Units by mouth daily.   dapagliflozin propanediol 10 MG Tabs tablet Commonly known as: Farxiga Take 1 tablet (10 mg total) by mouth daily.   Lancet Device Misc 1 each by Does not apply route in the morning, at noon, and at bedtime. May substitute to any manufacturer covered by patient's insurance.   Lancets Misc. Misc 1 each by Does not apply route in the morning, at noon, and at bedtime.  May substitute to any manufacturer covered by patient's insurance.   metFORMIN 1000 MG tablet Commonly known as: GLUCOPHAGE Take 1 tablet (1,000 mg total) by mouth 2 (two) times daily with a meal.   metoprolol tartrate 50 MG tablet Commonly known as: LOPRESSOR TAKE ONE TABLET BY MOUTH TWICE A DAY FOR HEART   nicotine 14 mg/24hr patch Commonly known as: Nicoderm CQ Place 1 patch (14 mg total) onto the skin daily.   nicotine 7 mg/24hr patch Commonly known as: Nicoderm CQ Place 1 patch (7 mg total) onto the skin daily.   sildenafil 20 MG tablet Commonly known as: REVATIO TAKE 5 TABLETS BY MOUTH AS NEEDED FOR ERECTILE DYSFUNCTION   Trelegy Ellipta 100-62.5-25 MCG/ACT Aepb Generic drug: Fluticasone-Umeclidin-Vilant Inhale 1 puff into the lungs daily.   valsartan 320 MG tablet Commonly known as: DIOVAN Take 1 tablet (320 mg total) by mouth daily. For blood pressure.   vortioxetine HBr 20 MG Tabs tablet Commonly known as: Trintellix Take 1 tablet (20 mg total) by mouth daily.   Xarelto 20 MG Tabs tablet Generic drug: rivaroxaban Take 1 tablet (20 mg total) by mouth daily.        History (reviewed): Past Medical History:  Diagnosis Date   COPD (chronic obstructive pulmonary disease)    COVID-19 03/2019   Depression    Diabetes mellitus without complication    Hyperlipidemia    Hypertension    Insomnia 2005   Osteoarthritis 2000   Paroxysmal A-fib    Pneumonia    Past Surgical History:  Procedure Laterality Date   CATARACT EXTRACTION, BILATERAL  2020   EYE SURGERY     JOINT REPLACEMENT     left shoulder replacement  2011   REPLACEMENT TOTAL KNEE Bilateral    Family History  Problem Relation Age of Onset   Cancer Mother    Suicidality Father 36   Depression Father    COPD Sister    Social History   Socioeconomic History   Marital status: Legally Separated    Spouse name: Not on file   Number of children: 1   Years of education: 12   Highest  education level: 12th grade  Occupational History   Occupation: retired    Comment: truck Hospital doctor  Tobacco Use   Smoking status: Every Day    Packs/day: 0.50    Years: 60.00    Additional pack years: 0.00    Total pack years: 30.00    Types: Cigarettes   Smokeless tobacco: Current    Types: Chew  Vaping Use   Vaping Use: Never used  Substance and Sexual Activity   Alcohol use: Not Currently    Alcohol/week: 0.0 standard drinks of alcohol    Comment: occasional   Drug use: No   Sexual activity: Yes    Partners: Female  Other Topics Concern   Not on file  Social History Narrative   Lives alone. Likes to drive his motorcycle; but he has sold his but hoping to getting another one. He enjoys watching television.   Social Determinants of Health   Financial Resource Strain: Low Risk  (08/22/2022)   Overall Financial Resource Strain (CARDIA)    Difficulty of Paying Living Expenses: Not hard at all  Food Insecurity: No Food Insecurity (08/22/2022)   Hunger Vital Sign    Worried About Running Out of Food in the Last Year: Never true    Ran Out of Food in the Last Year: Never true  Transportation Needs: No Transportation Needs (08/22/2022)   PRAPARE - Administrator, Civil Service (Medical): No    Lack of Transportation (Non-Medical): No  Physical Activity: Insufficiently Active (08/22/2022)   Exercise Vital Sign    Days of Exercise per Week: 1 day    Minutes of Exercise per Session: 50 min  Stress: No Stress Concern Present (08/22/2022)   [employer] of Occupational Health - Occupational Stress Questionnaire    Feeling of Stress : Not at all  Social Connections: Moderately Isolated (08/22/2022)   Social Connection and Isolation Panel [NHANES]    Frequency of Communication with Friends and Family: More than three times a week    Frequency of Social Gatherings with Friends and Family: Once a week    Attends Religious Services: Never    Database administrator or  Organizations: Yes    Attends Engineer, structural: More than 4 times per year    Marital Status: Separated    Activities of Daily Living    08/22/2022    2:14 PM  In your present state of health, do you have any difficulty performing the following activities:  Hearing? 0  Vision? 0  Difficulty concentrating or making decisions? 0  Walking or climbing stairs? 0  Dressing or bathing? 0  Doing errands, shopping? 0  Preparing Food and eating ? N  Using the Toilet? N  In the past six months, have you accidently leaked urine? N  Do you have problems with loss of bowel control? N  Managing your Medications? Y  Comment a friend helps set up the pill box  Managing your Finances? N  Housekeeping or managing your Housekeeping? N    Patient Education/ Literacy How often do you need to have someone help you when you read instructions, pamphlets, or other written materials from your doctor or pharmacy?: 1 - Never What is the last grade level you completed in school?: 12th grade  Exercise Current Exercise Habits: Home exercise routine, Type of exercise: Other - see comments;strength training/weights (physical therapy), Time (Minutes): 45, Frequency (Times/Week): 1, Weekly Exercise (Minutes/Week): 45, Intensity: Moderate, Exercise limited by: orthopedic condition(s)  Diet Patient reports consuming 2 meals a day and 3 snack(s) a day Patient reports that his primary diet is: Regular Patient reports that she does have regular access to food.   Depression Screen    08/22/2022    2:10 PM 04/27/2022   10:45 AM 08/24/2021    3:21 PM 07/19/2021    2:07 PM 07/14/2021    2:26 PM 09/15/2020    1:36 PM 07/06/2020   10:55 AM  PHQ 2/9 Scores  PHQ - 2 Score 0 0 1 3 0 0 0  PHQ- 9 Score   3 14 0  6     Fall Risk    08/22/2022    2:09 PM 08/22/2022    7:24 AM  07/14/2021    2:24 PM 09/15/2020    1:36 PM 07/06/2020   10:56 AM  Fall Risk   Falls in the past year? 1 1 1  0 1  Number falls in past  yr: 1 0 0 0 1  Injury with Fall? 1 1 1  0 1  Risk for fall due to : History of fall(s);Impaired mobility;Impaired balance/gait History of fall(s);Impaired balance/gait;Impaired mobility History of fall(s) No Fall Risks History of fall(s)  Follow up Falls evaluation completed;Education provided;Falls prevention discussed Falls evaluation completed;Education provided;Falls prevention discussed Falls evaluation completed Falls evaluation completed Falls evaluation completed     Objective:  Raymond Hardin seemed alert and oriented and he participated appropriately during our telephone visit.  Blood Pressure Weight BMI  BP Readings from Last 3 Encounters:  08/16/22 (!) 140/80  07/07/22 (!) 143/64  06/07/22 (!) 160/87   Wt Readings from Last 3 Encounters:  08/16/22 233 lb (105.7 kg)  06/07/22 239 lb (108.4 kg)  05/24/22 242 lb (109.8 kg)   BMI Readings from Last 1 Encounters:  08/16/22 33.43 kg/m    *Unable to obtain current vital signs, weight, and BMI due to telephone visit type  Hearing/Vision  Marita [place] did not seem to have difficulty with hearing/understanding during the telephone conversation Reports that he has not had a formal eye exam by an eye care professional within the past year Reports that he has not had a formal hearing evaluation within the past year *Unable to fully assess hearing and vision during telephone visit type  Cognitive Function:    08/22/2022    2:17 PM 07/14/2021    2:31 PM 06/08/2020    9:14 AM 02/04/2019    2:00 PM 01/22/2018    1:31 PM  6CIT Screen  What Year? 0 points 0 points 0 points 0 points 0 points  What month? 0 points 0 points 0 points 0 points 0 points  What time? 0 points 0 points 0 points 0 points 0 points  Count back from 20 0 points 0 points 0 points 0 points 0 points  Months in reverse 0 points 4 points 2 points 0 points 2 points  Repeat phrase 0 points 0 points 0 points 0 points 0 points  Total Score 0 points 4 points 2 points 0 points 2  points   (Normal:0-7, Significant for Dysfunction: >8)  Normal Cognitive Function Screening: Yes   Immunization & Health Maintenance Record Immunization History  Administered Date(s) Administered   Covid-19, Mrna,Vaccine(Spikevax)85yrs and older 04/16/2022   Fluad Quad(high Dose 65+) 01/25/2019, 05/15/2020, 04/16/2021   Influenza, High Dose Seasonal PF 02/27/2009   Influenza,inj,Quad PF,6+ Mos 01/23/2013, 02/23/2015, 03/01/2016, 01/31/2017, 01/05/2018   Influenza-Unspecified 03/09/2004, 04/20/2005, 04/25/2006, 05/24/2007, 02/08/2008, 03/23/2010, 02/02/2011, 03/09/2012, 01/07/2013, 05/15/2020, 04/16/2022   Janssen (J&J) SARS-COV-2 Vaccination 08/16/2019, 05/29/2020   Moderna Covid-19 Vaccine Bivalent Booster 105yrs & up 04/16/2021   Moderna Sars-Covid-2 Vaccination 12/18/2020   Pneumococcal Conjugate-13 08/13/2014   Pneumococcal Polysaccharide-23 07/01/2011, 04/16/2013, 11/11/2020   Pneumococcal-Unspecified 05/07/2004   Td 06/30/2020   Td (Adult), 2 Lf Tetanus Toxid, Preservative Free 06/30/2020   Tdap 05/10/2007, 01/05/2018   Zoster Recombinat (Shingrix) 06/30/2020, 09/30/2020    Health Maintenance  Topic Date Due   OPHTHALMOLOGY EXAM  Never done   COVID-19 Vaccine (6 - 2023-24 season) 09/01/2022 (Originally 06/11/2022)   Lung Cancer Screening  04/28/2023 (Originally 11/26/1990)   INFLUENZA VACCINE  12/08/2022   HEMOGLOBIN A1C  02/15/2023   Diabetic kidney evaluation - Urine ACR  04/28/2023   FOOT  EXAM  04/28/2023   Diabetic kidney evaluation - eGFR measurement  08/16/2023   Medicare Annual Wellness (AWV)  08/22/2023   DTaP/Tdap/Td (5 - Td or Tdap) 06/30/2030   Pneumonia Vaccine 52+ Years old  Completed   Hepatitis C Screening  Completed   Zoster Vaccines- Shingrix  Completed   HPV VACCINES  Aged Out   COLONOSCOPY (Pts 45-66yrs Insurance coverage will need to be confirmed)  Discontinued       Assessment  This is a routine wellness examination for [employer].  Health  Maintenance: Due or Overdue Health Maintenance Due  Topic Date Due   OPHTHALMOLOGY EXAM  Never done    Raymond Hardin does not need a referral for Community Assistance: Care Management:   no Social Work:    no Prescription Assistance:  no Nutrition/Diabetes Education:  no   Plan:  Personalized Goals  Goals Addressed               This Visit's Progress     Patient Stated (pt-stated)        Patient stated he would like to continue to stay healthy.       Personalized Health Maintenance & Screening Recommendations  Diabetic eye exam  Lung Cancer Screening Recommended: yes (Low Dose CT Chest recommended if Age 83-80 years, 20 pack-year currently smoking OR have quit w/in past 15 years) Hepatitis C Screening recommended: no HIV Screening recommended: no  Advanced Directives: Written information was not prepared per patient's request.  Referrals & Orders Orders Placed This Encounter  Procedures   Ambulatory Referral for Lung Cancer Scre    Follow-up Plan Follow-up with Jomarie Longs, PA-C as planned Medicare wellness visit in one year.  AVS printed and mailed to the patient.   I have personally reviewed and noted the following in the patient's chart:   Medical and social history Use of alcohol, tobacco or illicit drugs  Current medications and supplements Functional ability and status Nutritional status Physical activity Advanced directives List of other physicians Hospitalizations, surgeries, and ER visits in previous 12 months Vitals Screenings to include cognitive, depression, and falls Referrals and appointments  In addition, I have reviewed and discussed with Raymond Hardin certain preventive protocols, quality metrics, and best practice recommendations. A written personalized care plan for preventive services as well as general preventive health recommendations is available and can be mailed to the patient at his request.      Modesto Charon, RN  BSN  08/22/2022

## 2022-08-25 DIAGNOSIS — G319 Degenerative disease of nervous system, unspecified: Secondary | ICD-10-CM | POA: Diagnosis not present

## 2022-08-25 DIAGNOSIS — E1122 Type 2 diabetes mellitus with diabetic chronic kidney disease: Secondary | ICD-10-CM | POA: Diagnosis not present

## 2022-08-25 DIAGNOSIS — I480 Paroxysmal atrial fibrillation: Secondary | ICD-10-CM | POA: Diagnosis not present

## 2022-08-25 DIAGNOSIS — J449 Chronic obstructive pulmonary disease, unspecified: Secondary | ICD-10-CM | POA: Diagnosis not present

## 2022-08-25 DIAGNOSIS — N1831 Chronic kidney disease, stage 3a: Secondary | ICD-10-CM | POA: Diagnosis not present

## 2022-08-25 DIAGNOSIS — I129 Hypertensive chronic kidney disease with stage 1 through stage 4 chronic kidney disease, or unspecified chronic kidney disease: Secondary | ICD-10-CM | POA: Diagnosis not present

## 2022-08-25 DIAGNOSIS — F419 Anxiety disorder, unspecified: Secondary | ICD-10-CM | POA: Diagnosis not present

## 2022-08-25 DIAGNOSIS — F32A Depression, unspecified: Secondary | ICD-10-CM | POA: Diagnosis not present

## 2022-08-25 DIAGNOSIS — M1990 Unspecified osteoarthritis, unspecified site: Secondary | ICD-10-CM | POA: Diagnosis not present

## 2022-08-30 DIAGNOSIS — N1831 Chronic kidney disease, stage 3a: Secondary | ICD-10-CM | POA: Diagnosis not present

## 2022-08-30 DIAGNOSIS — E1122 Type 2 diabetes mellitus with diabetic chronic kidney disease: Secondary | ICD-10-CM | POA: Diagnosis not present

## 2022-08-30 DIAGNOSIS — F419 Anxiety disorder, unspecified: Secondary | ICD-10-CM | POA: Diagnosis not present

## 2022-08-30 DIAGNOSIS — F32A Depression, unspecified: Secondary | ICD-10-CM | POA: Diagnosis not present

## 2022-08-30 DIAGNOSIS — I129 Hypertensive chronic kidney disease with stage 1 through stage 4 chronic kidney disease, or unspecified chronic kidney disease: Secondary | ICD-10-CM | POA: Diagnosis not present

## 2022-08-30 DIAGNOSIS — G319 Degenerative disease of nervous system, unspecified: Secondary | ICD-10-CM | POA: Diagnosis not present

## 2022-08-30 DIAGNOSIS — J449 Chronic obstructive pulmonary disease, unspecified: Secondary | ICD-10-CM | POA: Diagnosis not present

## 2022-08-30 DIAGNOSIS — M1990 Unspecified osteoarthritis, unspecified site: Secondary | ICD-10-CM | POA: Diagnosis not present

## 2022-08-30 DIAGNOSIS — I480 Paroxysmal atrial fibrillation: Secondary | ICD-10-CM | POA: Diagnosis not present

## 2022-09-01 ENCOUNTER — Other Ambulatory Visit: Payer: Self-pay | Admitting: *Deleted

## 2022-09-01 DIAGNOSIS — I10 Essential (primary) hypertension: Secondary | ICD-10-CM

## 2022-09-01 MED ORDER — AMLODIPINE BESYLATE 2.5 MG PO TABS
2.5000 mg | ORAL_TABLET | Freq: Every day | ORAL | 0 refills | Status: DC
Start: 2022-09-01 — End: 2023-02-13

## 2022-09-02 ENCOUNTER — Other Ambulatory Visit: Payer: Medicare HMO | Admitting: Pharmacist

## 2022-09-02 DIAGNOSIS — E1122 Type 2 diabetes mellitus with diabetic chronic kidney disease: Secondary | ICD-10-CM | POA: Diagnosis not present

## 2022-09-02 DIAGNOSIS — I480 Paroxysmal atrial fibrillation: Secondary | ICD-10-CM | POA: Diagnosis not present

## 2022-09-02 DIAGNOSIS — G319 Degenerative disease of nervous system, unspecified: Secondary | ICD-10-CM | POA: Diagnosis not present

## 2022-09-02 DIAGNOSIS — M1990 Unspecified osteoarthritis, unspecified site: Secondary | ICD-10-CM | POA: Diagnosis not present

## 2022-09-02 DIAGNOSIS — N1831 Chronic kidney disease, stage 3a: Secondary | ICD-10-CM | POA: Diagnosis not present

## 2022-09-02 DIAGNOSIS — F32A Depression, unspecified: Secondary | ICD-10-CM | POA: Diagnosis not present

## 2022-09-02 DIAGNOSIS — J449 Chronic obstructive pulmonary disease, unspecified: Secondary | ICD-10-CM | POA: Diagnosis not present

## 2022-09-02 DIAGNOSIS — F419 Anxiety disorder, unspecified: Secondary | ICD-10-CM | POA: Diagnosis not present

## 2022-09-02 DIAGNOSIS — I129 Hypertensive chronic kidney disease with stage 1 through stage 4 chronic kidney disease, or unspecified chronic kidney disease: Secondary | ICD-10-CM | POA: Diagnosis not present

## 2022-09-02 NOTE — Progress Notes (Signed)
09/02/2022 Name: Raymond Hardin MRN: [id number] DOB: 03-Sep-1940   Raymond Hardin is a 82 y.o. year old male who presented for a telephone visit.   They were referred to the pharmacist by a quality report for assistance in managing  quality metrics: controlling blood pressure (CBP), med adherence cholesterol (MAC), med adherence diabetes (MAD), med adherence hypertension [facility])) .    Subjective:  Care Team: Primary Care Provider: Nolene Ebbs ; Next Scheduled Visit: 06/28/22 RN visit for BP check  Medication Access/Adherence  Current Pharmacy:  [facility] - [place], [place] - [address] [place] [address] Phone: [phone] Fax: [phone]  [id number] - [place], [place] - [address] AT [facility] [address]  [place] [address] Phone: [phone] Fax: [phone]   Patient reports affordability concerns with their medications: Yes   Hypertension:  Current medications: amlodipine 2.5mg , valsartan 320mg  daily, metoprolol 50mg  BID  Patient does not have a validated, automated, upper arm home BP cuff - currently checked by home health Current blood pressure readings readings: 143/82  Patient denies hypotensive s/sx including dizziness, lightheadedness.  Patient denies hypertensive symptoms including headache, chest pain, shortness of breath   Tobacco Abuse:  Tobacco Use History: Age when started using tobacco on a daily basis: 82 yr old, about 50 years Number of cigarettes per day 1ppd prior to nicotine patch Smokes first cigarette 30 minutes after waking Does not wake at night to smoke  Triggers include physical: physical habit, patient is around others who smoke  Quit Attempt History: Most recent quit attempt 2 yrs ago Longest time ever been tobacco free 6 months Methods tried in the past include None.  Motivators to quitting include watching older sister succomb to  COPD; barriers include friends smoke    Objective:  Lab Results  Component Value Date   HGBA1C 6.1 (A) 08/16/2022    Lab Results  Component Value Date   CREATININE 1.39 (H) 08/16/2022   BUN 15 08/16/2022   NA 144 08/16/2022   K 4.3 08/16/2022   CL 109 08/16/2022   CO2 26 08/16/2022    Lab Results  Component Value Date   CHOL 88 08/16/2022   HDL 47 08/16/2022   LDLCALC 19 08/16/2022   TRIG 140 08/16/2022   CHOLHDL 1.9 08/16/2022    Medications Reviewed Today     Reviewed by Modesto Charon, RN (Registered Nurse) on 08/22/22 at 1409  Med List Status: <None>   Medication Order Taking? Sig Documenting Provider Last Dose Status Informant  albuterol (VENTOLIN HFA) 108 (90 Base) MCG/ACT inhaler [id number] Yes Inhale 2 puffs into the lungs every 6 (six) hours as needed. Jomarie Longs, PA-C Taking Active   ALPRAZolam Prudy Feeler) 0.5 MG tablet [id number] Yes TAKE 1 TABLET(0.5 MG) BY MOUTH AT BEDTIME AS NEEDED FOR ANXIETY Breeback, Jade L, PA-C Taking Active   amLODipine (NORVASC) 2.5 MG tablet [id number] Yes Take 1 tablet (2.5 mg total) by mouth daily. For blood pressure. Jomarie Longs, PA-C Taking Active   ARIPiprazole (ABILIFY) 2 MG tablet [id number] Yes TAKE 1 TABLET(2 MG) BY MOUTH DAILY Breeback, Jade L, PA-C Taking Active   atorvastatin (LIPITOR) 20 MG tablet [id number] Yes Take 1 tablet (20 mg total) by mouth daily. Jomarie Longs, PA-C Taking Active   Blood Glucose Monitoring Suppl DEVI [id number] Yes 1 each by Does not apply route in the morning, at noon, and at bedtime. May  substitute to any manufacturer covered by patient's insurance. Jomarie Longs, PA-C Taking Active   busPIRone (BUSPAR) 15 MG tablet [id number] Yes Take 1 tablet (15 mg total) by mouth 3 (three) times daily. Jomarie Longs, PA-C Taking Active   cholecalciferol (VITAMIN D) 1000 units tablet [id number] Yes Take 1,000 Units by mouth daily. [provider] Taking Active   dapagliflozin propanediol  (FARXIGA) 10 MG TABS tablet [id number] Yes Take 1 tablet (10 mg total) by mouth daily. Jomarie Longs, PA-C Taking Active   Fluticasone-Umeclidin-Vilant (TRELEGY ELLIPTA) 100-62.5-25 MCG/ACT AEPB [id number] Yes Inhale 1 puff into the lungs daily. Tandy Gaw L, PA-C Taking Active   Glucose Blood (BLOOD GLUCOSE TEST STRIPS) STRP [id number] Yes 1 each by In Vitro route in the morning, at noon, and at bedtime. May substitute to any manufacturer covered by patient's insurance. Jomarie Longs, PA-C Taking Active   Lancet Device MISC [id number] Yes 1 each by Does not apply route in the morning, at noon, and at bedtime. May substitute to any manufacturer covered by patient's insurance. Jomarie Longs, PA-C Taking Active   Lancets Misc. MISC [id number] Yes 1 each by Does not apply route in the morning, at noon, and at bedtime. May substitute to any manufacturer covered by patient's insurance. Jomarie Longs, PA-C Taking Active   metFORMIN (GLUCOPHAGE) 1000 MG tablet [id number] Yes Take 1 tablet (1,000 mg total) by mouth 2 (two) times daily with a meal. Breeback, Jade L, PA-C Taking Active   metoprolol tartrate (LOPRESSOR) 50 MG tablet [id number] Yes TAKE ONE TABLET BY MOUTH TWICE A DAY FOR HEART Latty, Jade L, PA-C Taking Active   nicotine (NICODERM CQ) 14 mg/24hr patch [id number] Yes Place 1 patch (14 mg total) onto the skin daily. Jomarie Longs, PA-C Taking Active   nicotine (NICODERM CQ) 7 mg/24hr patch [id number] Yes Place 1 patch (7 mg total) onto the skin daily. Jomarie Longs, PA-C Taking Active   sildenafil (REVATIO) 20 MG tablet [id number] Yes TAKE 5 TABLETS BY MOUTH AS NEEDED FOR ERECTILE DYSFUNCTION Breeback, Jade L, PA-C Taking Active   valsartan (DIOVAN) 320 MG tablet [id number] Yes Take 1 tablet (320 mg total) by mouth daily. For blood pressure. Jomarie Longs, PA-C Taking Active   vortioxetine HBr (TRINTELLIX) 20 MG TABS tablet [id number] Yes Take 1 tablet (20 mg total) by mouth daily.  Jomarie Longs, PA-C Taking Active   XARELTO 20 MG TABS tablet [id number] Yes Take 1 tablet (20 mg total) by mouth daily. Jomarie Longs, PA-C Taking Active               Assessment/Plan:   Hypertension: - Currently uncontrolled, suspect adherence may be contributing factor, therefore increasing/adding medication may not be optimal response  - Reviewed long term cardiovascular and renal outcomes of uncontrolled blood pressure - Reviewed appropriate blood pressure monitoring technique and reviewed goal blood pressure. Recommended to check home blood pressure and heart rate with home health  - Recommend to continue current medications  Medication Access - For improving medication access, we completed the LIS Extra Help application in February over the phone. Contacted his dispensing pharmacy today to inquire copays, branded medications (example, Marcelline Deist) are now a 90DS for just $11.20. This means the LIS Extra Help program was accepted! - Contacted patient to provide this update, and advised him to pick up his medicines at the pharmacy  Tobacco Abuse - Currently uncontrolled, but patient is making progress  - Provided motivational interviewing to assess  tobacco use and strategies for reduction, encouraged use of TWO methods (dual NRT, he states he wishes to try plain hard candy first) - Provided information on 1 800 QUIT NOW support program   Follow Up Plan: 1 month  Lynnda Shields, PharmD Clinical Pharmacist [facility] [phone]

## 2022-09-08 ENCOUNTER — Other Ambulatory Visit: Payer: Medicare HMO | Admitting: Pharmacist

## 2022-09-12 DIAGNOSIS — G319 Degenerative disease of nervous system, unspecified: Secondary | ICD-10-CM | POA: Diagnosis not present

## 2022-09-12 DIAGNOSIS — I480 Paroxysmal atrial fibrillation: Secondary | ICD-10-CM | POA: Diagnosis not present

## 2022-09-12 DIAGNOSIS — M1990 Unspecified osteoarthritis, unspecified site: Secondary | ICD-10-CM | POA: Diagnosis not present

## 2022-09-12 DIAGNOSIS — J449 Chronic obstructive pulmonary disease, unspecified: Secondary | ICD-10-CM | POA: Diagnosis not present

## 2022-09-12 DIAGNOSIS — F32A Depression, unspecified: Secondary | ICD-10-CM | POA: Diagnosis not present

## 2022-09-12 DIAGNOSIS — I129 Hypertensive chronic kidney disease with stage 1 through stage 4 chronic kidney disease, or unspecified chronic kidney disease: Secondary | ICD-10-CM | POA: Diagnosis not present

## 2022-09-12 DIAGNOSIS — F419 Anxiety disorder, unspecified: Secondary | ICD-10-CM | POA: Diagnosis not present

## 2022-09-12 DIAGNOSIS — N1831 Chronic kidney disease, stage 3a: Secondary | ICD-10-CM | POA: Diagnosis not present

## 2022-09-12 DIAGNOSIS — E1122 Type 2 diabetes mellitus with diabetic chronic kidney disease: Secondary | ICD-10-CM | POA: Diagnosis not present

## 2022-09-21 ENCOUNTER — Other Ambulatory Visit: Payer: Medicare HMO | Admitting: Pharmacist

## 2022-09-21 ENCOUNTER — Telehealth: Payer: Self-pay | Admitting: Pharmacist

## 2022-09-21 NOTE — Progress Notes (Signed)
Attempted to contact patient x2 for scheduled appointment for medication management. Voicemail not set up, so was unable to leave HIPAA compliant message for patient to return my call at their convenience. Will make future attempts to contact.   Lynnda Shields, PharmD, BCPS Clinical Pharmacist [facility]

## 2022-09-21 NOTE — Progress Notes (Unsigned)
09/21/2022 Name: Raymond Hardin MRN: [id number] DOB: Apr 16, 1941   Raymond Hardin is a 82 y.o. year old male who presented for a telephone visit.   They were referred to the pharmacist by a quality report for assistance in managing  quality metrics: controlling blood pressure (CBP), med adherence cholesterol (MAC), med adherence diabetes (MAD), med adherence hypertension [facility])) .    Subjective:  Care Team: Primary Care Provider: Nolene Hardin ; Next Scheduled Visit: 06/28/22 RN visit for BP check  Medication Access/Adherence  Current Pharmacy:  [facility] - [place], [place] - [address] [place] [address] Phone: [phone] Fax: [phone]  [facility] #[id number] - [place], [place] - [address] AT [facility] [address] [place] [address] Phone: [phone] Fax: [phone]   Patient reports affordability concerns with their medications: Yes   Hypertension:  Current medications: amlodipine 2.5mg , valsartan 320mg  daily, metoprolol 50mg  BID  Patient does not have a validated, automated, upper arm home BP cuff - currently checked by home health Current blood pressure readings readings: 143/82  Patient denies hypotensive s/sx including dizziness, lightheadedness.  Patient denies hypertensive symptoms including headache, chest pain, shortness of breath   Tobacco Abuse:  Tobacco Use History: Age when started using tobacco on a daily basis: 82 yr old, about 50 years Number of cigarettes per day 1ppd prior to nicotine patch Smokes first cigarette 30 minutes after waking Does not wake at night to smoke  Triggers include physical: physical habit, patient is around others who smoke  Quit Attempt History: Most recent quit attempt 2 yrs ago Longest time ever been tobacco free 6 months Methods tried in the past include None.  Motivators to quitting include watching older sister succomb to  COPD; barriers include friends smoke    Objective:  Lab Results  Component Value Date   HGBA1C 6.1 (A) 08/16/2022    Lab Results  Component Value Date   CREATININE 1.39 (H) 08/16/2022   BUN 15 08/16/2022   NA 144 08/16/2022   K 4.3 08/16/2022   CL 109 08/16/2022   CO2 26 08/16/2022    Lab Results  Component Value Date   CHOL 88 08/16/2022   HDL 47 08/16/2022   LDLCALC 19 08/16/2022   TRIG 140 08/16/2022   CHOLHDL 1.9 08/16/2022    Medications Reviewed Today     Reviewed by Raymond Hardin, RPH (Pharmacist) on 09/02/22 at 1704  Med List Status: <None>   Medication Order Taking? Sig Documenting Provider Last Dose Status Informant  albuterol (VENTOLIN HFA) 108 (90 Base) MCG/ACT inhaler [id number] No Inhale 2 puffs into the lungs every 6 (six) hours as needed. Raymond Longs, PA-C Taking Active   ALPRAZolam Prudy Feeler) 0.5 MG tablet [id number] No TAKE 1 TABLET(0.5 MG) BY MOUTH AT BEDTIME AS NEEDED FOR ANXIETY Hardin, Raymond L, PA-C Taking Active   amLODipine (NORVASC) 2.5 MG tablet [id number]  Take 1 tablet (2.5 mg total) by mouth daily. For blood pressure. Raymond Longs, PA-C  Active   ARIPiprazole (ABILIFY) 2 MG tablet [id number] No TAKE 1 TABLET(2 MG) BY MOUTH DAILY Hardin, Raymond L, PA-C Taking Active   atorvastatin (LIPITOR) 20 MG tablet [id number] No Take 1 tablet (20 mg total) by mouth daily. Raymond Longs, PA-C Taking Active   Blood Glucose Monitoring Suppl DEVI [id number] No 1 each by Does not apply route in the morning, at noon, and at bedtime. May  substitute to any manufacturer covered by patient's insurance. Hardin, Raymond L, PA-C Taking Active   busPIRone (BUSPAR) 15 MG tablet [id number] No Take 1 tablet (15 mg total) by mouth 3 (three) times daily. Raymond Longs, PA-C Taking Active   cholecalciferol (VITAMIN D) 1000 units tablet [id number] No Take 1,000 Units by mouth daily. [provider] Taking Active   dapagliflozin propanediol (FARXIGA) 10 MG TABS  tablet [id number] No Take 1 tablet (10 mg total) by mouth daily. Hardin, Raymond L, PA-C Taking Active   Fluticasone-Umeclidin-Vilant (TRELEGY ELLIPTA) 100-62.5-25 MCG/ACT AEPB [id number] No Inhale 1 puff into the lungs daily. Raymond Longs, PA-C Taking Active   metFORMIN (GLUCOPHAGE) 1000 MG tablet [id number] No Take 1 tablet (1,000 mg total) by mouth 2 (two) times daily with a meal. Hardin, Raymond L, PA-C Taking Active   metoprolol tartrate (LOPRESSOR) 50 MG tablet [id number] No TAKE ONE TABLET BY MOUTH TWICE A DAY FOR HEART [place], Raymond L, PA-C Taking Active   nicotine (NICODERM CQ) 14 mg/24hr patch [id number] No Place 1 patch (14 mg total) onto the skin daily. Raymond Longs, PA-C Taking Active   nicotine (NICODERM CQ) 7 mg/24hr patch [id number] No Place 1 patch (7 mg total) onto the skin daily. Raymond Longs, PA-C Taking Active   sildenafil (REVATIO) 20 MG tablet [id number] No TAKE 5 TABLETS BY MOUTH AS NEEDED FOR ERECTILE DYSFUNCTION Hardin, Raymond L, PA-C Taking Active   valsartan (DIOVAN) 320 MG tablet [id number] No Take 1 tablet (320 mg total) by mouth daily. For blood pressure. Raymond Longs, PA-C Taking Active   vortioxetine HBr (TRINTELLIX) 20 MG TABS tablet [id number] No Take 1 tablet (20 mg total) by mouth daily. Hardin, Raymond L, PA-C Taking Active   XARELTO 20 MG TABS tablet [id number] No Take 1 tablet (20 mg total) by mouth daily. Raymond Longs, PA-C Taking Active               Assessment/Plan:   Hypertension: - Currently uncontrolled, suspect adherence may be contributing factor, therefore increasing/adding medication may not be optimal response  - Reviewed long term cardiovascular and renal outcomes of uncontrolled blood pressure - Reviewed appropriate blood pressure monitoring technique and reviewed goal blood pressure. Recommended to check home blood pressure and heart rate with home health  - Recommend to continue current medications  Medication Access - For  improving medication access, we completed the LIS Extra Help application in February over the phone. Contacted his dispensing pharmacy today to inquire copays, branded medications (example, Marcelline Deist) are now a 90DS for just $11.20. This means the LIS Extra Help program was accepted! - Contacted patient to provide this update, and advised him to pick up his medicines at the pharmacy  Tobacco Abuse - Currently uncontrolled, but patient is making progress  - Provided motivational interviewing to assess tobacco use and strategies for reduction, encouraged use of TWO methods (dual NRT, he states he wishes to try plain hard candy first) - Provided information on 1 800 QUIT NOW support program   Follow Up Plan: 1 month  Lynnda Shields, PharmD Clinical Pharmacist [facility] [phone]

## 2022-09-30 ENCOUNTER — Other Ambulatory Visit: Payer: Self-pay

## 2022-09-30 ENCOUNTER — Telehealth: Payer: Self-pay | Admitting: Physician Assistant

## 2022-09-30 DIAGNOSIS — N1831 Chronic kidney disease, stage 3a: Secondary | ICD-10-CM

## 2022-09-30 MED ORDER — ACCU-CHEK SOFTCLIX LANCETS MISC
3.0000 | Freq: Every day | 1 refills | Status: DC
Start: 2022-09-30 — End: 2023-05-18

## 2022-09-30 MED ORDER — ACCU-CHEK GUIDE VI STRP
3.0000 | ORAL_STRIP | Freq: Every day | 1 refills | Status: DC
Start: 2022-09-30 — End: 2022-10-05

## 2022-09-30 NOTE — Telephone Encounter (Signed)
MADE IN ERROR

## 2022-10-05 ENCOUNTER — Other Ambulatory Visit: Payer: Self-pay

## 2022-10-05 DIAGNOSIS — E1122 Type 2 diabetes mellitus with diabetic chronic kidney disease: Secondary | ICD-10-CM

## 2022-10-05 NOTE — Telephone Encounter (Signed)
Routing to covering provider.  [facility] pharmacy is requesting med refill for accu-chek test strips. Written by historical provider. Rx pended.

## 2022-10-06 MED ORDER — ACCU-CHEK GUIDE VI STRP: 3.0000 | ORAL_STRIP | Freq: Every day | 1 refills | Status: DC

## 2022-11-10 ENCOUNTER — Other Ambulatory Visit: Payer: Self-pay | Admitting: Physician Assistant

## 2022-11-10 DIAGNOSIS — N1831 Chronic kidney disease, stage 3a: Secondary | ICD-10-CM

## 2022-11-10 DIAGNOSIS — E1122 Type 2 diabetes mellitus with diabetic chronic kidney disease: Secondary | ICD-10-CM

## 2022-11-11 ENCOUNTER — Other Ambulatory Visit: Payer: Self-pay | Admitting: Physician Assistant

## 2022-11-11 DIAGNOSIS — E782 Mixed hyperlipidemia: Secondary | ICD-10-CM

## 2022-11-16 ENCOUNTER — Telehealth (HOSPITAL_BASED_OUTPATIENT_CLINIC_OR_DEPARTMENT_OTHER): Payer: Self-pay | Admitting: Physician Assistant

## 2022-11-16 NOTE — Telephone Encounter (Signed)
Breeback, Raymond L, PA-C  You11 minutes ago (4:32 PM)    Needs follow up to see how progressing and if needs another refill.    Called and spoke with pt letting him know about the info per [facility] and he verbalized understanding. Pt has an upcoming appt scheduled 7/23 which he will keep as scheduled. Nothing further needed.

## 2022-11-16 NOTE — Telephone Encounter (Signed)
Fax received from pt's pharmacy for refill of terbinafine 250mg  tablets with instructions for pt to take 1 tab by mouth daily. Quantity last prescribed on 05/13/22 was 84 tabs.  Not currently seeing med on pt's med list. Lesly Rubenstein, please advise on refill.

## 2022-11-18 ENCOUNTER — Telehealth: Payer: Self-pay

## 2022-11-18 NOTE — Telephone Encounter (Signed)
Called patient to schedule appointment for TREBINAFINE 250 MG med refill, patient has NO VOICEMAIL SETUP, will try again later, thanks.

## 2022-11-29 ENCOUNTER — Ambulatory Visit: Payer: Medicare HMO | Admitting: Physician Assistant

## 2022-12-16 ENCOUNTER — Other Ambulatory Visit: Payer: Self-pay

## 2022-12-16 DIAGNOSIS — F411 Generalized anxiety disorder: Secondary | ICD-10-CM

## 2022-12-16 MED ORDER — BUSPIRONE HCL 15 MG PO TABS
15.0000 mg | ORAL_TABLET | Freq: Three times a day (TID) | ORAL | 0 refills | Status: DC
Start: 2022-12-16 — End: 2023-02-13

## 2023-02-13 ENCOUNTER — Other Ambulatory Visit: Payer: Self-pay

## 2023-02-13 DIAGNOSIS — J4489 Other specified chronic obstructive pulmonary disease: Secondary | ICD-10-CM

## 2023-02-13 DIAGNOSIS — I10 Essential (primary) hypertension: Secondary | ICD-10-CM

## 2023-02-13 DIAGNOSIS — I480 Paroxysmal atrial fibrillation: Secondary | ICD-10-CM

## 2023-02-13 DIAGNOSIS — E782 Mixed hyperlipidemia: Secondary | ICD-10-CM

## 2023-02-13 DIAGNOSIS — E1122 Type 2 diabetes mellitus with diabetic chronic kidney disease: Secondary | ICD-10-CM

## 2023-02-13 DIAGNOSIS — F411 Generalized anxiety disorder: Secondary | ICD-10-CM

## 2023-02-13 DIAGNOSIS — N529 Male erectile dysfunction, unspecified: Secondary | ICD-10-CM

## 2023-02-13 DIAGNOSIS — N1831 Chronic kidney disease, stage 3a: Secondary | ICD-10-CM

## 2023-02-13 NOTE — Telephone Encounter (Signed)
Requesting multiple prescriptions to [facility] mail order [facility].  Last OV 08/16/2022 Upcoming appt AWV 08/2023.

## 2023-02-14 MED ORDER — SILDENAFIL CITRATE 20 MG PO TABS: ORAL_TABLET | ORAL | 5 refills | Status: DC

## 2023-02-14 MED ORDER — ALBUTEROL SULFATE HFA 108 (90 BASE) MCG/ACT IN AERS
2.0000 | INHALATION_SPRAY | Freq: Four times a day (QID) | RESPIRATORY_TRACT | 1 refills | Status: DC | PRN
Start: 2023-02-14 — End: 2024-02-27

## 2023-02-14 MED ORDER — ACCU-CHEK GUIDE VI STRP: 3.0000 | ORAL_STRIP | Freq: Every day | 1 refills | Status: DC

## 2023-02-14 MED ORDER — METFORMIN HCL 1000 MG PO TABS: ORAL_TABLET | ORAL | 1 refills | Status: AC

## 2023-02-14 MED ORDER — METOPROLOL TARTRATE 50 MG PO TABS: ORAL_TABLET | ORAL | 3 refills | Status: DC

## 2023-02-14 MED ORDER — BUSPIRONE HCL 15 MG PO TABS: 15.0000 mg | ORAL_TABLET | Freq: Three times a day (TID) | ORAL | 0 refills | Status: DC

## 2023-02-14 MED ORDER — TRELEGY ELLIPTA 100-62.5-25 MCG/ACT IN AEPB
1.0000 | INHALATION_SPRAY | Freq: Every day | RESPIRATORY_TRACT | 11 refills | Status: DC
Start: 2023-02-14 — End: 2023-11-01

## 2023-02-14 MED ORDER — XARELTO 20 MG PO TABS: 20.0000 mg | ORAL_TABLET | Freq: Every day | ORAL | 3 refills | Status: DC

## 2023-02-14 MED ORDER — AMLODIPINE BESYLATE 2.5 MG PO TABS: 2.5000 mg | ORAL_TABLET | Freq: Every day | ORAL | 0 refills | Status: DC

## 2023-02-14 MED ORDER — ATORVASTATIN CALCIUM 20 MG PO TABS: 20.0000 mg | ORAL_TABLET | Freq: Every day | ORAL | 1 refills | Status: DC

## 2023-02-14 MED ORDER — ALPRAZOLAM 0.5 MG PO TABS: ORAL_TABLET | ORAL | 2 refills | Status: AC

## 2023-03-07 ENCOUNTER — Other Ambulatory Visit (HOSPITAL_BASED_OUTPATIENT_CLINIC_OR_DEPARTMENT_OTHER): Payer: Self-pay | Admitting: Physician Assistant

## 2023-03-07 DIAGNOSIS — F411 Generalized anxiety disorder: Secondary | ICD-10-CM

## 2023-03-15 ENCOUNTER — Other Ambulatory Visit: Payer: Self-pay | Admitting: Physician Assistant

## 2023-03-15 DIAGNOSIS — N529 Male erectile dysfunction, unspecified: Secondary | ICD-10-CM

## 2023-03-27 ENCOUNTER — Other Ambulatory Visit: Payer: Self-pay | Admitting: Physician Assistant

## 2023-03-27 DIAGNOSIS — E1122 Type 2 diabetes mellitus with diabetic chronic kidney disease: Secondary | ICD-10-CM

## 2023-03-28 DIAGNOSIS — I959 Hypotension, unspecified: Secondary | ICD-10-CM | POA: Diagnosis not present

## 2023-03-28 DIAGNOSIS — W01198A Fall on same level from slipping, tripping and stumbling with subsequent striking against other object, initial encounter: Secondary | ICD-10-CM | POA: Diagnosis not present

## 2023-03-28 DIAGNOSIS — R262 Difficulty in walking, not elsewhere classified: Secondary | ICD-10-CM | POA: Diagnosis not present

## 2023-03-28 DIAGNOSIS — Z043 Encounter for examination and observation following other accident: Secondary | ICD-10-CM | POA: Diagnosis not present

## 2023-03-28 DIAGNOSIS — S0003XA Contusion of scalp, initial encounter: Secondary | ICD-10-CM | POA: Diagnosis not present

## 2023-03-28 DIAGNOSIS — R0600 Dyspnea, unspecified: Secondary | ICD-10-CM | POA: Diagnosis not present

## 2023-03-28 DIAGNOSIS — M7989 Other specified soft tissue disorders: Secondary | ICD-10-CM | POA: Diagnosis not present

## 2023-03-28 DIAGNOSIS — R0602 Shortness of breath: Secondary | ICD-10-CM | POA: Diagnosis not present

## 2023-03-28 DIAGNOSIS — M25532 Pain in left wrist: Secondary | ICD-10-CM | POA: Diagnosis not present

## 2023-03-28 DIAGNOSIS — L03116 Cellulitis of left lower limb: Secondary | ICD-10-CM | POA: Diagnosis not present

## 2023-03-28 DIAGNOSIS — W19XXXA Unspecified fall, initial encounter: Secondary | ICD-10-CM | POA: Diagnosis not present

## 2023-03-28 DIAGNOSIS — Z7901 Long term (current) use of anticoagulants: Secondary | ICD-10-CM | POA: Diagnosis not present

## 2023-03-28 DIAGNOSIS — Z87891 Personal history of nicotine dependence: Secondary | ICD-10-CM | POA: Diagnosis not present

## 2023-03-28 DIAGNOSIS — S0990XA Unspecified injury of head, initial encounter: Secondary | ICD-10-CM | POA: Diagnosis not present

## 2023-03-28 DIAGNOSIS — R600 Localized edema: Secondary | ICD-10-CM | POA: Diagnosis not present

## 2023-04-22 DIAGNOSIS — I160 Hypertensive urgency: Secondary | ICD-10-CM | POA: Diagnosis not present

## 2023-04-22 DIAGNOSIS — E1165 Type 2 diabetes mellitus with hyperglycemia: Secondary | ICD-10-CM | POA: Diagnosis not present

## 2023-04-22 DIAGNOSIS — G319 Degenerative disease of nervous system, unspecified: Secondary | ICD-10-CM | POA: Diagnosis not present

## 2023-04-22 DIAGNOSIS — R4182 Altered mental status, unspecified: Secondary | ICD-10-CM | POA: Diagnosis not present

## 2023-04-22 DIAGNOSIS — R9082 White matter disease, unspecified: Secondary | ICD-10-CM | POA: Diagnosis not present

## 2023-04-22 DIAGNOSIS — R29818 Other symptoms and signs involving the nervous system: Secondary | ICD-10-CM | POA: Diagnosis not present

## 2023-04-22 DIAGNOSIS — I639 Cerebral infarction, unspecified: Secondary | ICD-10-CM | POA: Diagnosis not present

## 2023-04-22 DIAGNOSIS — R42 Dizziness and giddiness: Secondary | ICD-10-CM | POA: Diagnosis not present

## 2023-04-22 DIAGNOSIS — I480 Paroxysmal atrial fibrillation: Secondary | ICD-10-CM | POA: Diagnosis not present

## 2023-04-22 DIAGNOSIS — E1122 Type 2 diabetes mellitus with diabetic chronic kidney disease: Secondary | ICD-10-CM | POA: Diagnosis not present

## 2023-04-22 DIAGNOSIS — R918 Other nonspecific abnormal finding of lung field: Secondary | ICD-10-CM | POA: Diagnosis not present

## 2023-04-22 DIAGNOSIS — R531 Weakness: Secondary | ICD-10-CM | POA: Diagnosis not present

## 2023-04-22 DIAGNOSIS — I129 Hypertensive chronic kidney disease with stage 1 through stage 4 chronic kidney disease, or unspecified chronic kidney disease: Secondary | ICD-10-CM | POA: Diagnosis not present

## 2023-04-22 DIAGNOSIS — N179 Acute kidney failure, unspecified: Secondary | ICD-10-CM | POA: Diagnosis not present

## 2023-04-22 DIAGNOSIS — E785 Hyperlipidemia, unspecified: Secondary | ICD-10-CM | POA: Diagnosis not present

## 2023-04-22 DIAGNOSIS — N1831 Chronic kidney disease, stage 3a: Secondary | ICD-10-CM | POA: Diagnosis not present

## 2023-04-22 DIAGNOSIS — G4733 Obstructive sleep apnea (adult) (pediatric): Secondary | ICD-10-CM | POA: Diagnosis not present

## 2023-04-23 ENCOUNTER — Other Ambulatory Visit: Payer: Self-pay | Admitting: Physician Assistant

## 2023-04-23 DIAGNOSIS — I080 Rheumatic disorders of both mitral and aortic valves: Secondary | ICD-10-CM | POA: Diagnosis not present

## 2023-04-23 DIAGNOSIS — R531 Weakness: Secondary | ICD-10-CM | POA: Diagnosis not present

## 2023-04-24 DIAGNOSIS — R531 Weakness: Secondary | ICD-10-CM | POA: Diagnosis not present

## 2023-04-25 ENCOUNTER — Telehealth: Payer: Self-pay

## 2023-04-25 DIAGNOSIS — R531 Weakness: Secondary | ICD-10-CM | POA: Diagnosis not present

## 2023-04-25 NOTE — Telephone Encounter (Signed)
Copied from CRM [id number]. Topic: General - Other >> Apr 21, 2023  4:55 PM Louie Casa B wrote: Reason for CRM:  Dr Willa Rough from [facility] is calling saying that pt needs home care please call her @ [phone] and ask for [place] [facility]

## 2023-04-25 NOTE — Telephone Encounter (Signed)
Attempted to contact Dr. Willa Rough at [phone] for clarification of home care request. The operator transferred me to the provider line. No answer, phone keeps ringing. No option available to leave a vm msg.

## 2023-04-26 DIAGNOSIS — R531 Weakness: Secondary | ICD-10-CM | POA: Diagnosis not present

## 2023-04-27 ENCOUNTER — Telehealth: Payer: Self-pay

## 2023-04-27 DIAGNOSIS — M1990 Unspecified osteoarthritis, unspecified site: Secondary | ICD-10-CM | POA: Diagnosis not present

## 2023-04-27 DIAGNOSIS — E1165 Type 2 diabetes mellitus with hyperglycemia: Secondary | ICD-10-CM | POA: Diagnosis not present

## 2023-04-27 DIAGNOSIS — I4891 Unspecified atrial fibrillation: Secondary | ICD-10-CM | POA: Diagnosis not present

## 2023-04-27 DIAGNOSIS — E1122 Type 2 diabetes mellitus with diabetic chronic kidney disease: Secondary | ICD-10-CM | POA: Diagnosis not present

## 2023-04-27 DIAGNOSIS — G4733 Obstructive sleep apnea (adult) (pediatric): Secondary | ICD-10-CM | POA: Diagnosis not present

## 2023-04-27 DIAGNOSIS — F32A Depression, unspecified: Secondary | ICD-10-CM | POA: Diagnosis not present

## 2023-04-27 DIAGNOSIS — N179 Acute kidney failure, unspecified: Secondary | ICD-10-CM | POA: Diagnosis not present

## 2023-04-27 DIAGNOSIS — N1831 Chronic kidney disease, stage 3a: Secondary | ICD-10-CM | POA: Diagnosis not present

## 2023-04-27 DIAGNOSIS — I129 Hypertensive chronic kidney disease with stage 1 through stage 4 chronic kidney disease, or unspecified chronic kidney disease: Secondary | ICD-10-CM | POA: Diagnosis not present

## 2023-04-27 NOTE — Transitions of Care (Post Inpatient/ED Visit) (Signed)
04/27/2023  Name: Raymond Hardin MRN: [id number] DOB: 12-26-1940  Today's TOC FU Call Status: Today's TOC FU Call Status:: Successful TOC FU Call Completed TOC FU Call Complete Date: 04/27/23 Patient's Name and Date of Birth confirmed.  Transition Care Management Follow-up Telephone Call Date of Discharge: 04/26/23 Discharge Facility: Other [facility]) Name of Other (Non[facility]) Discharge Facility: [facility] Type of Discharge: Inpatient Admission Primary Inpatient Discharge Diagnosis:: Weakness How have you been since you were released from the hospital?: Better Any questions or concerns?: No (Patient notes he is not feeling weak anymore)  Items Reviewed: Did you receive and understand the discharge instructions provided?: Yes Medications obtained,verified, and reconciled?: Yes (Medications Reviewed) Any new allergies since your discharge?: No Dietary orders reviewed?: No Do you have support at home?: No (Patient currently lives in hotel, looking for ALF with [facility] social worker)  Medications Reviewed Today: Medications Reviewed Today     Reviewed by Jodelle Gross, RN (Case Manager) on 04/27/23 at 1253  Med List Status: <None>   Medication Order Taking? Sig Documenting Provider Last Dose Status Informant  Accu-Chek Softclix Lancets lancets [id number] Yes 3 each by Other route daily. Use as directed for  TID blood sugar checks Agapito Games, MD Taking Active   albuterol (VENTOLIN HFA) 108 (90 Base) MCG/ACT inhaler [id number] Yes Inhale 2 puffs into the lungs every 6 (six) hours as needed. Jomarie Longs, PA-C Taking Active   ALPRAZolam Prudy Feeler) 0.5 MG tablet [id number] Yes TAKE 1 TABLET(0.5 MG) BY MOUTH AT BEDTIME AS NEEDED FOR ANXIETY Breeback, Jade L, PA-C Taking Active   amLODipine (NORVASC) 2.5 MG tablet [id number] Yes Take 1 tablet (2.5 mg total) by mouth daily. For blood pressure. Jomarie Longs, PA-C Taking Active   ARIPiprazole  (ABILIFY) 2 MG tablet [id number] Yes TAKE 1 TABLET(2 MG) BY MOUTH DAILY Breeback, Jade L, PA-C Taking Active   atorvastatin (LIPITOR) 20 MG tablet [id number] Yes Take 1 tablet (20 mg total) by mouth daily. Jomarie Longs, PA-C Taking Active   Blood Glucose Monitoring Suppl (ACCU-CHEK GUIDE) w/Device KIT [id number] Yes USE AS DIRECTED TO CHECK BLOOD SUGAR MORNING NOON AND AT BEDTIME Breeback, Jade L, PA-C Taking Active   busPIRone (BUSPAR) 15 MG tablet [id number] Yes Take 1 tablet (15 mg total) by mouth 3 (three) times daily. NEEDS AN APPT Tandy Gaw L, PA-C Taking Active   cholecalciferol (VITAMIN D) 1000 units tablet [id number] Yes Take 1,000 Units by mouth daily. [provider] Taking Active   dapagliflozin propanediol (FARXIGA) 10 MG TABS tablet [id number] Yes Take 1 tablet (10 mg total) by mouth daily. Jomarie Longs, PA-C Taking Active   Fluticasone-Umeclidin-Vilant (TRELEGY ELLIPTA) 100-62.5-25 MCG/ACT AEPB [id number] Yes Inhale 1 puff into the lungs daily. Jomarie Longs, PA-C Taking Active   glucose blood (ACCU-CHEK GUIDE) test strip [id number] Yes 3 each by Other route as needed for other. Use as directed for TID blood sugar checks [provider] Taking Active   glucose blood (ACCU-CHEK GUIDE) test strip [id number] Yes 3 each by Other route daily. Use as directed for TID blood sugar checks. Jomarie Longs, PA-C Taking Active   metFORMIN (GLUCOPHAGE) 1000 MG tablet [id number] Yes Take one tablet twice a day. Jomarie Longs, PA-C Taking Active   metoprolol tartrate (LOPRESSOR) 50 MG tablet [id number] Yes TAKE ONE TABLET BY MOUTH TWICE A DAY FOR HEART [place], Jade L, PA-C Taking Active   nicotine (NICODERM CQ) 14 mg/24hr patch [id number] No Place  1 patch (14 mg total) onto the skin daily. Breeback, Jade L, PA-C Unknown Active   nicotine (NICODERM CQ) 7 mg/24hr patch [id number] No Place 1 patch (7 mg total) onto the skin daily. Breeback, Jade L, PA-C Unknown Active    sildenafil (REVATIO) 20 MG tablet [id number] Yes TAKE 5 TABLETS BY MOUTH AS NEEDED FOR ERECTILE DYSFUNCTION. Jomarie Longs, PA-C Taking Active   valsartan (DIOVAN) 320 MG tablet [id number] Yes Take 1 tablet (320 mg total) by mouth daily. For blood pressure. Jomarie Longs, PA-C Taking Active   vortioxetine HBr (TRINTELLIX) 20 MG TABS tablet [id number] Yes Take 1 tablet (20 mg total) by mouth daily. Jomarie Longs, PA-C Taking Active   XARELTO 20 MG TABS tablet [id number] Yes Take 1 tablet (20 mg total) by mouth daily. Jomarie Longs, PA-C Taking Active             Home Care and Equipment/Supplies: Were Home Health Services Ordered?: Yes Name of Home Health Agency:: [facility] Has Agency set up a time to come to your home?: Yes First Home Health Visit Date: 04/27/23 Any new equipment or medical supplies ordered?: No  Functional Questionnaire: Do you need assistance with bathing/showering or dressing?: No Do you need assistance with meal preparation?: No Do you need assistance with eating?: No Do you have difficulty maintaining continence: No Do you need assistance with getting out of bed/getting out of a chair/moving?: No Do you have difficulty managing or taking your medications?: No  Follow up appointments reviewed: PCP Follow-up appointment confirmed?: Yes Date of PCP follow-up appointment?: 05/05/23 Follow-up Provider: Tandy Gaw Specialist [facility] Follow-up appointment confirmed?: NA Do you need transportation to your follow-up appointment?: No Do you understand care options if your condition(s) worsen?: Yes-patient verbalized understanding  SDOH Interventions Today    Flowsheet Row Most Recent Value  SDOH Interventions   Food Insecurity Interventions Intervention Not Indicated  Housing Interventions Intervention Not Indicated  [Patient has been living in motel for months, looking for ALF with home health social worker]  Transportation Interventions Intervention  Not Indicated  Utilities Interventions Intervention Not Indicated      Jodelle Gross RN, BSN, CCM RN Care Manager  Transitions of Care  [facility] Population [facility]  [phone]

## 2023-04-27 NOTE — Transitions of Care (Post Inpatient/ED Visit) (Signed)
   04/27/2023  Name: Raymond Hardin MRN: [id number] DOB: 1941/03/23  Today's TOC FU Call Status: Today's TOC FU Call Status:: Unsuccessful Call (1st Attempt) Unsuccessful Call (1st Attempt) Date: 04/27/23  Attempted to reach the patient regarding the most recent Inpatient/ED visit.  Follow Up Plan: Additional outreach attempts will be made to reach the patient to complete the Transitions of Care (Post Inpatient/ED visit) call.   Signature Karena Addison, LPN [facility] Nurse Health Advisor Direct Dial [phone]

## 2023-04-28 DIAGNOSIS — M1990 Unspecified osteoarthritis, unspecified site: Secondary | ICD-10-CM | POA: Diagnosis not present

## 2023-04-28 DIAGNOSIS — G4733 Obstructive sleep apnea (adult) (pediatric): Secondary | ICD-10-CM | POA: Diagnosis not present

## 2023-04-28 DIAGNOSIS — E1122 Type 2 diabetes mellitus with diabetic chronic kidney disease: Secondary | ICD-10-CM | POA: Diagnosis not present

## 2023-04-28 DIAGNOSIS — N179 Acute kidney failure, unspecified: Secondary | ICD-10-CM | POA: Diagnosis not present

## 2023-04-28 DIAGNOSIS — I4891 Unspecified atrial fibrillation: Secondary | ICD-10-CM | POA: Diagnosis not present

## 2023-04-28 DIAGNOSIS — N1831 Chronic kidney disease, stage 3a: Secondary | ICD-10-CM | POA: Diagnosis not present

## 2023-04-28 DIAGNOSIS — E1165 Type 2 diabetes mellitus with hyperglycemia: Secondary | ICD-10-CM | POA: Diagnosis not present

## 2023-04-28 DIAGNOSIS — F32A Depression, unspecified: Secondary | ICD-10-CM | POA: Diagnosis not present

## 2023-04-28 DIAGNOSIS — I129 Hypertensive chronic kidney disease with stage 1 through stage 4 chronic kidney disease, or unspecified chronic kidney disease: Secondary | ICD-10-CM | POA: Diagnosis not present

## 2023-04-28 NOTE — Transitions of Care (Post Inpatient/ED Visit) (Unsigned)
   04/28/2023  Name: Raymond Hardin MRN: [id number] DOB: 1941/04/15  Today's TOC FU Call Status: Today's TOC FU Call Status:: Unsuccessful Call (2nd Attempt) Unsuccessful Call (1st Attempt) Date: 04/27/23 Unsuccessful Call (2nd Attempt) Date: 04/28/23  Attempted to reach the patient regarding the most recent Inpatient/ED visit.  Follow Up Plan: Additional outreach attempts will be made to reach the patient to complete the Transitions of Care (Post Inpatient/ED visit) call.   Signature  Karena Addison, LPN [facility] Nurse Health Advisor Direct Dial [phone]

## 2023-05-01 ENCOUNTER — Telehealth: Payer: Self-pay

## 2023-05-01 NOTE — Telephone Encounter (Signed)
Attempted call to Raymond Hardin. Left a voice mail message requesting a return call  As voice mail greeting message absent and not sure that connection is correct line or confidential .

## 2023-05-01 NOTE — Telephone Encounter (Signed)
Copied from CRM [id number]. Topic: Clinical - Home Health Verbal Orders >> May 01, 2023  8:32 AM Donita Brooks wrote: Caller/Agency: Jonny Ruiz from [facility] Callback Number: [phone] Service Requested: Occupational Therapy Frequency:  1week 5 for eval. Any new concerns about the patient? No

## 2023-05-01 NOTE — Telephone Encounter (Signed)
OT Raymond Hardin -  States he had requested  Once a week for 6 weeks  ( not 5 weeks )  Approval given unless hears otherwise from provider

## 2023-05-01 NOTE — Telephone Encounter (Signed)
Spoke with Jonny Ruiz PT - verbal orders given

## 2023-05-01 NOTE — Telephone Encounter (Signed)
Copied from CRM [id number]. Topic: Clinical - Home Health Verbal Orders >> May 01, 2023 11:30 AM Clayton Bibles wrote: Caller/Agency: Jonny Ruiz - requesting Selena Batten to call him back with okay - he missed her call Callback Number: [phone] Service Requested: [facility] Frequency: 1 X weekly for 6 weeks for PT therapy  Any new concerns about the patient? No

## 2023-05-01 NOTE — Transitions of Care (Post Inpatient/ED Visit) (Signed)
   05/01/2023  Name: Raymond Hardin MRN: [id number] DOB: April 08, 1941  Today's TOC FU Call Status: Today's TOC FU Call Status:: Unsuccessful Call (3rd Attempt) Unsuccessful Call (1st Attempt) Date: 04/27/23 Unsuccessful Call (2nd Attempt) Date: 04/28/23 Unsuccessful Call (3rd Attempt) Date: 05/01/23  Attempted to reach the patient regarding the most recent Inpatient/ED visit.  Follow Up Plan: No further outreach attempts will be made at this time. We have been unable to contact the patient.  Signature Karena Addison, LPN [facility] Nurse Health Advisor Direct Dial [phone]

## 2023-05-04 ENCOUNTER — Telehealth: Payer: Self-pay

## 2023-05-04 NOTE — Telephone Encounter (Signed)
Copied from CRM [id number]. Topic: Clinical - Home Health Verbal Orders >> May 04, 2023 11:02 AM Rona Ravens wrote: Clydie Braun need verbal orders for OT.  Caller/Agency: [facility]  Callback Number: [phone]  Service Requested: Occupational Therapy  Frequency: One visit per week for 4 weeks in total starting 05/08/2023  Any new concerns about the patient? No but start date was supposed to be on 05/05/2023 however there was never a call back to the OT regarding the original CRM Verbal Order request [phone]. She is requesting for the start date to be on 05/08/2023.

## 2023-05-04 NOTE — Telephone Encounter (Signed)
Copied from CRM [id number]. Topic: Clinical - Home Health Verbal Orders >> May 04, 2023 11:02 AM Rona Ravens wrote: Raymond Hardin need verbal orders for OT.  Caller/Agency: [facility]  Callback Number: [phone]  Service Requested: Occupational Therapy  Frequency: One visit per week for 4 weeks in total starting 05/08/2023  Any new concerns about the patient? No but start date was supposed to be on 05/05/2023 however there was never a call back to the OT regarding the original CRM Verbal Order request [phone]. She is requesting for the start date to be on 05/08/2023.

## 2023-05-05 ENCOUNTER — Inpatient Hospital Stay: Payer: Medicare HMO | Admitting: Physician Assistant

## 2023-05-05 ENCOUNTER — Telehealth: Payer: Self-pay | Admitting: Physician Assistant

## 2023-05-05 NOTE — Telephone Encounter (Signed)
Raymond Hardin with OT informed of approval of verbal orders request.

## 2023-05-05 NOTE — Telephone Encounter (Signed)
Clydie Braun with OT informed of approval of verbal orders request.

## 2023-05-05 NOTE — Telephone Encounter (Signed)
Copied from CRM [id number]. Topic: Medical Record Request - Other >> May 05, 2023 12:24 PM Ivette P wrote: Reason for CRM: Tammy from [facility] calling in for patient stating he cannot perform Physical Therapy. And will not be able to attend this week. And will try again next week to schedule for Physical therapy.

## 2023-05-11 DIAGNOSIS — N1831 Chronic kidney disease, stage 3a: Secondary | ICD-10-CM | POA: Diagnosis not present

## 2023-05-11 DIAGNOSIS — I129 Hypertensive chronic kidney disease with stage 1 through stage 4 chronic kidney disease, or unspecified chronic kidney disease: Secondary | ICD-10-CM | POA: Diagnosis not present

## 2023-05-11 DIAGNOSIS — M1990 Unspecified osteoarthritis, unspecified site: Secondary | ICD-10-CM | POA: Diagnosis not present

## 2023-05-11 DIAGNOSIS — F32A Depression, unspecified: Secondary | ICD-10-CM | POA: Diagnosis not present

## 2023-05-11 DIAGNOSIS — I4891 Unspecified atrial fibrillation: Secondary | ICD-10-CM | POA: Diagnosis not present

## 2023-05-11 DIAGNOSIS — N179 Acute kidney failure, unspecified: Secondary | ICD-10-CM | POA: Diagnosis not present

## 2023-05-11 DIAGNOSIS — E1122 Type 2 diabetes mellitus with diabetic chronic kidney disease: Secondary | ICD-10-CM | POA: Diagnosis not present

## 2023-05-11 DIAGNOSIS — E1165 Type 2 diabetes mellitus with hyperglycemia: Secondary | ICD-10-CM | POA: Diagnosis not present

## 2023-05-11 DIAGNOSIS — G4733 Obstructive sleep apnea (adult) (pediatric): Secondary | ICD-10-CM | POA: Diagnosis not present

## 2023-05-12 DIAGNOSIS — F32A Depression, unspecified: Secondary | ICD-10-CM | POA: Diagnosis not present

## 2023-05-12 DIAGNOSIS — N1831 Chronic kidney disease, stage 3a: Secondary | ICD-10-CM | POA: Diagnosis not present

## 2023-05-12 DIAGNOSIS — E1122 Type 2 diabetes mellitus with diabetic chronic kidney disease: Secondary | ICD-10-CM | POA: Diagnosis not present

## 2023-05-12 DIAGNOSIS — M1990 Unspecified osteoarthritis, unspecified site: Secondary | ICD-10-CM | POA: Diagnosis not present

## 2023-05-12 DIAGNOSIS — I129 Hypertensive chronic kidney disease with stage 1 through stage 4 chronic kidney disease, or unspecified chronic kidney disease: Secondary | ICD-10-CM | POA: Diagnosis not present

## 2023-05-12 DIAGNOSIS — E1165 Type 2 diabetes mellitus with hyperglycemia: Secondary | ICD-10-CM | POA: Diagnosis not present

## 2023-05-12 DIAGNOSIS — N179 Acute kidney failure, unspecified: Secondary | ICD-10-CM | POA: Diagnosis not present

## 2023-05-12 DIAGNOSIS — I4891 Unspecified atrial fibrillation: Secondary | ICD-10-CM | POA: Diagnosis not present

## 2023-05-12 DIAGNOSIS — G4733 Obstructive sleep apnea (adult) (pediatric): Secondary | ICD-10-CM | POA: Diagnosis not present

## 2023-05-15 DIAGNOSIS — N179 Acute kidney failure, unspecified: Secondary | ICD-10-CM | POA: Diagnosis not present

## 2023-05-15 DIAGNOSIS — I129 Hypertensive chronic kidney disease with stage 1 through stage 4 chronic kidney disease, or unspecified chronic kidney disease: Secondary | ICD-10-CM | POA: Diagnosis not present

## 2023-05-15 DIAGNOSIS — G4733 Obstructive sleep apnea (adult) (pediatric): Secondary | ICD-10-CM | POA: Diagnosis not present

## 2023-05-15 DIAGNOSIS — N1831 Chronic kidney disease, stage 3a: Secondary | ICD-10-CM | POA: Diagnosis not present

## 2023-05-15 DIAGNOSIS — E1122 Type 2 diabetes mellitus with diabetic chronic kidney disease: Secondary | ICD-10-CM | POA: Diagnosis not present

## 2023-05-15 DIAGNOSIS — E1165 Type 2 diabetes mellitus with hyperglycemia: Secondary | ICD-10-CM | POA: Diagnosis not present

## 2023-05-15 DIAGNOSIS — M1990 Unspecified osteoarthritis, unspecified site: Secondary | ICD-10-CM | POA: Diagnosis not present

## 2023-05-15 DIAGNOSIS — F32A Depression, unspecified: Secondary | ICD-10-CM | POA: Diagnosis not present

## 2023-05-15 DIAGNOSIS — I4891 Unspecified atrial fibrillation: Secondary | ICD-10-CM | POA: Diagnosis not present

## 2023-05-18 ENCOUNTER — Telehealth: Payer: Self-pay

## 2023-05-18 DIAGNOSIS — N1831 Chronic kidney disease, stage 3a: Secondary | ICD-10-CM | POA: Diagnosis not present

## 2023-05-18 DIAGNOSIS — E1165 Type 2 diabetes mellitus with hyperglycemia: Secondary | ICD-10-CM | POA: Diagnosis not present

## 2023-05-18 DIAGNOSIS — M1990 Unspecified osteoarthritis, unspecified site: Secondary | ICD-10-CM | POA: Diagnosis not present

## 2023-05-18 DIAGNOSIS — N179 Acute kidney failure, unspecified: Secondary | ICD-10-CM | POA: Diagnosis not present

## 2023-05-18 DIAGNOSIS — E1122 Type 2 diabetes mellitus with diabetic chronic kidney disease: Secondary | ICD-10-CM | POA: Diagnosis not present

## 2023-05-18 DIAGNOSIS — I4891 Unspecified atrial fibrillation: Secondary | ICD-10-CM | POA: Diagnosis not present

## 2023-05-18 DIAGNOSIS — G4733 Obstructive sleep apnea (adult) (pediatric): Secondary | ICD-10-CM | POA: Diagnosis not present

## 2023-05-18 DIAGNOSIS — I129 Hypertensive chronic kidney disease with stage 1 through stage 4 chronic kidney disease, or unspecified chronic kidney disease: Secondary | ICD-10-CM | POA: Diagnosis not present

## 2023-05-18 DIAGNOSIS — F32A Depression, unspecified: Secondary | ICD-10-CM | POA: Diagnosis not present

## 2023-05-18 MED ORDER — ACCU-CHEK SOFTCLIX LANCETS MISC: 3.0000 | Freq: Every day | 99 refills | Status: AC

## 2023-05-18 MED ORDER — ACCU-CHEK GUIDE W/DEVICE KIT: PACK | 0 refills | Status: DC

## 2023-05-18 NOTE — Telephone Encounter (Signed)
 Task completed. Spoke with Particia Bunnell. Per Particia, patient was unable to get his glucometer and supplies from the pharmacy. The pharmacy notified Particia that there was no rxs on file. New rxs sent to [facility] pharmacy as requested. Patient notified to check with pharmacy for his rxs.

## 2023-05-18 NOTE — Telephone Encounter (Signed)
 Copied from CRM [id number]. Topic: Clinical - Prescription Issue >> May 18, 2023  9:59 AM Farrel B wrote: Reason for CRM: Particia Bunnell [phone], Occupational Therapy Assistant, has called in regards to the patient being a diabetic. She stated she has been trying to keep up with his  numbers testing his sugar, but the patient advised the provider was suppose to call in a prescription for a monitor to the pharmacy. She requesting a callback to check status.

## 2023-05-24 DIAGNOSIS — I129 Hypertensive chronic kidney disease with stage 1 through stage 4 chronic kidney disease, or unspecified chronic kidney disease: Secondary | ICD-10-CM | POA: Diagnosis not present

## 2023-05-24 DIAGNOSIS — N1831 Chronic kidney disease, stage 3a: Secondary | ICD-10-CM | POA: Diagnosis not present

## 2023-05-24 DIAGNOSIS — I4891 Unspecified atrial fibrillation: Secondary | ICD-10-CM | POA: Diagnosis not present

## 2023-05-24 DIAGNOSIS — E1122 Type 2 diabetes mellitus with diabetic chronic kidney disease: Secondary | ICD-10-CM | POA: Diagnosis not present

## 2023-05-24 DIAGNOSIS — N179 Acute kidney failure, unspecified: Secondary | ICD-10-CM | POA: Diagnosis not present

## 2023-05-24 DIAGNOSIS — G4733 Obstructive sleep apnea (adult) (pediatric): Secondary | ICD-10-CM | POA: Diagnosis not present

## 2023-05-24 DIAGNOSIS — F32A Depression, unspecified: Secondary | ICD-10-CM | POA: Diagnosis not present

## 2023-05-24 DIAGNOSIS — M1990 Unspecified osteoarthritis, unspecified site: Secondary | ICD-10-CM | POA: Diagnosis not present

## 2023-05-24 DIAGNOSIS — E1165 Type 2 diabetes mellitus with hyperglycemia: Secondary | ICD-10-CM | POA: Diagnosis not present

## 2023-05-25 ENCOUNTER — Ambulatory Visit: Payer: Self-pay | Admitting: Physician Assistant

## 2023-05-25 DIAGNOSIS — N179 Acute kidney failure, unspecified: Secondary | ICD-10-CM | POA: Diagnosis not present

## 2023-05-25 DIAGNOSIS — G4733 Obstructive sleep apnea (adult) (pediatric): Secondary | ICD-10-CM | POA: Diagnosis not present

## 2023-05-25 DIAGNOSIS — E1122 Type 2 diabetes mellitus with diabetic chronic kidney disease: Secondary | ICD-10-CM | POA: Diagnosis not present

## 2023-05-25 DIAGNOSIS — M1990 Unspecified osteoarthritis, unspecified site: Secondary | ICD-10-CM | POA: Diagnosis not present

## 2023-05-25 DIAGNOSIS — E1165 Type 2 diabetes mellitus with hyperglycemia: Secondary | ICD-10-CM | POA: Diagnosis not present

## 2023-05-25 DIAGNOSIS — F32A Depression, unspecified: Secondary | ICD-10-CM | POA: Diagnosis not present

## 2023-05-25 DIAGNOSIS — N1831 Chronic kidney disease, stage 3a: Secondary | ICD-10-CM | POA: Diagnosis not present

## 2023-05-25 DIAGNOSIS — I129 Hypertensive chronic kidney disease with stage 1 through stage 4 chronic kidney disease, or unspecified chronic kidney disease: Secondary | ICD-10-CM | POA: Diagnosis not present

## 2023-05-25 DIAGNOSIS — I4891 Unspecified atrial fibrillation: Secondary | ICD-10-CM | POA: Diagnosis not present

## 2023-05-25 NOTE — Telephone Encounter (Signed)
  Chief Complaint: open sores/leg swelling Symptoms: oozing yellow/clear fluid Frequency: comes and goes  Disposition: [] ED /[] Urgent Care (no appt availability in office) / [x] Appointment(In office/virtual)/ []  [facility]/ [] Home Care/ [] Refused Recommended Disposition /[] [facility]/ []  Follow-up with PCP Additional Notes: OT Ferd Hibbs called during their appt to relay new findings from their last appt. Pt has increase left leg swelling and now has 2 open sores on shin with biggest the size of a nickel.  OT stated the color is yellowish to clear fluid with some minimal bleeding.  Pt denies pain. OT denies warm to touch. Per protocol, pt to be seen with 24 hours. Pt has appt tomorrow 1/17 but would like to be put on cancellation list for today due to transportation. RN gave care advice and pt/OT verbalized understanding.          Copied from CRM [id number]. Topic: Appointments - Appointment Scheduling >> May 25, 2023  9:55 AM Zane Herald wrote: Patient/patient representative is calling to schedule an appointment. Refer to attachments for appointment information. Reason for Disposition  [1] Small red streak or spreading redness (< 2 inches or 5 cm) AND [2] no fever  Answer Assessment - Initial Assessment Questions 1. APPEARANCE of SORES: "What do the sores look like?"     Open, oozing clear/yellow fluid 2. NUMBER: "How many sores are there?"    2 3. SIZE: "How big is the largest sore?"     nickel 4. LOCATION: "Where are the sores located?"     Lower left leg on shin 5. ONSET: "When did the sores begin?"     unsure 6. TENDER: "Does it hurt when you touch it?"  (Scale 1-10; or mild, moderate, severe)      denies 7. CAUSE: "What do you think is causing the sores?"     unsure 8. OTHER SYMPTOMS: "Do you have any other symptoms?" (e.g., fever, new weakness)     Leg swelling, BP elevated  Protocols used: Sores-A-AH

## 2023-05-26 ENCOUNTER — Encounter: Payer: Self-pay | Admitting: Medical-Surgical

## 2023-05-26 ENCOUNTER — Ambulatory Visit (INDEPENDENT_AMBULATORY_CARE_PROVIDER_SITE_OTHER): Payer: Medicare PPO | Admitting: Medical-Surgical

## 2023-05-26 VITALS — BP 167/82 | HR 73 | Resp 20 | Ht 70.0 in | Wt 242.0 lb

## 2023-05-26 DIAGNOSIS — L03116 Cellulitis of left lower limb: Secondary | ICD-10-CM

## 2023-05-26 MED ORDER — AMOXICILLIN-POT CLAVULANATE 875-125 MG PO TABS: 1.0000 | ORAL_TABLET | Freq: Two times a day (BID) | ORAL | 0 refills | Status: DC

## 2023-05-26 MED ORDER — FUROSEMIDE 20 MG PO TABS: 20.0000 mg | ORAL_TABLET | Freq: Every day | ORAL | 0 refills | Status: DC

## 2023-05-26 NOTE — Progress Notes (Signed)
        Established patient visit  History, exam, impression, and plan:  1. Cellulitis of left lower extremity (Primary) Pleasant 83 year old male presenting today for evaluation of his left lower leg after his therapist was concerned and made the appointment. Has been having issues with significant swelling for the last month but recently developed the erythema, tenderness, and open blisters that are draining. Denies fevers, chills, and unusual myalgias. [facility] provider initially gave him a week's worth of a medication to reduce the swelling which was helpful but the fluid came back. He was also given a topical medication to apply twice daily but this has not been helping. On exam, the skin is tight, shiny, erythematous with two open blisters that are draining serous fluid. There are several small scatter intact blisters over the anterior and lateral aspects of the lower leg. See clinical photos below. Concern for cellulitis so treating with Augmentin BID x 7 days. Adding 7 days of Lasix to help with edema. Plan for [facility] today with follow up evaluation in 1 week. Patient agreeable and verbalized understanding of the plan.  - amoxicillin-clavulanate (AUGMENTIN) 875-125 MG tablet; Take 1 tablet by mouth 2 (two) times daily.  Dispense: 14 tablet; Refill: 0 - furosemide (LASIX) 20 MG tablet; Take 1 tablet (20 mg total) by mouth daily.  Dispense: 7 tablet; Refill: 0      Procedures performed this visit: None.  Return in about 1 week (around 06/02/2023) for Unna boot/re-evaluate wounds with PCP.  __________________________________ Thayer Ohm, DNP, APRN, FNP-BC Primary Care and Sports Medicine [facility]

## 2023-05-30 ENCOUNTER — Telehealth: Payer: Self-pay

## 2023-05-30 NOTE — Telephone Encounter (Signed)
Task completed. Verbal order provided directly to [place] [facility].

## 2023-05-30 NOTE — Telephone Encounter (Signed)
Copied from CRM [id number]. Topic: Clinical - Home Health Verbal Orders >> May 30, 2023 10:04 AM Dimitri Ped wrote: Caller/Agency: ms sheila/[facility] Callback Number: [phone]/ can leave a message if dont answer Service Requested: Physical Therapy Frequency: extension 1 time a week for 3 weeks balance and strengthening  Any new concerns about the patient? No

## 2023-05-31 ENCOUNTER — Telehealth: Payer: Self-pay

## 2023-05-31 NOTE — Telephone Encounter (Signed)
Copied from CRM [id number]. Topic: Clinical - Home Health Verbal Orders >> May 31, 2023  1:25 PM Prudencio Pair wrote: Caller/Agency: [facility] Callback Number: [phone] Service Requested: Occupational Therapy Frequency: requesting to extend order for OT for once a week for 3 more weeks.  Any new concerns about the patient? Yes, states pt saw doctor on Friday & legs were wrapped. Wants to know if legs are needing to stay wrapped?

## 2023-06-01 NOTE — Telephone Encounter (Signed)
Called - Raymond Hardin - with [facility] Left a detailed voice mail message  Also stated that if she had any questions she could give [place] a call at [phone].

## 2023-06-01 NOTE — Telephone Encounter (Signed)
Called and left detailed voice mail message for Raymond Hardin with [facility].

## 2023-06-02 ENCOUNTER — Ambulatory Visit: Payer: Medicare PPO | Admitting: Physician Assistant

## 2023-06-05 ENCOUNTER — Ambulatory Visit: Payer: Medicare PPO | Admitting: Physician Assistant

## 2023-06-12 ENCOUNTER — Ambulatory Visit (INDEPENDENT_AMBULATORY_CARE_PROVIDER_SITE_OTHER): Payer: Medicare PPO | Admitting: Physician Assistant

## 2023-06-12 VITALS — BP 124/82

## 2023-06-12 DIAGNOSIS — F411 Generalized anxiety disorder: Secondary | ICD-10-CM

## 2023-06-12 DIAGNOSIS — L03116 Cellulitis of left lower limb: Secondary | ICD-10-CM | POA: Insufficient documentation

## 2023-06-12 DIAGNOSIS — F3341 Major depressive disorder, recurrent, in partial remission: Secondary | ICD-10-CM | POA: Diagnosis not present

## 2023-06-12 DIAGNOSIS — I10 Essential (primary) hypertension: Secondary | ICD-10-CM

## 2023-06-12 DIAGNOSIS — Z7984 Long term (current) use of oral hypoglycemic drugs: Secondary | ICD-10-CM | POA: Diagnosis not present

## 2023-06-12 DIAGNOSIS — I872 Venous insufficiency (chronic) (peripheral): Secondary | ICD-10-CM | POA: Insufficient documentation

## 2023-06-12 DIAGNOSIS — E1122 Type 2 diabetes mellitus with diabetic chronic kidney disease: Secondary | ICD-10-CM

## 2023-06-12 DIAGNOSIS — N1831 Chronic kidney disease, stage 3a: Secondary | ICD-10-CM

## 2023-06-12 MED ORDER — VALSARTAN 320 MG PO TABS: 320.0000 mg | ORAL_TABLET | Freq: Every day | ORAL | 1 refills | Status: DC

## 2023-06-12 MED ORDER — ARIPIPRAZOLE 2 MG PO TABS: ORAL_TABLET | ORAL | 1 refills | Status: DC

## 2023-06-12 MED ORDER — AMLODIPINE BESYLATE 2.5 MG PO TABS: 2.5000 mg | ORAL_TABLET | Freq: Every day | ORAL | 0 refills | Status: DC

## 2023-06-12 MED ORDER — BUSPIRONE HCL 15 MG PO TABS: 15.0000 mg | ORAL_TABLET | Freq: Three times a day (TID) | ORAL | 3 refills | Status: AC

## 2023-06-12 MED ORDER — VORTIOXETINE HBR 20 MG PO TABS: 20.0000 mg | ORAL_TABLET | Freq: Every day | ORAL | 1 refills | Status: DC

## 2023-06-12 MED ORDER — DAPAGLIFLOZIN PROPANEDIOL 10 MG PO TABS: 10.0000 mg | ORAL_TABLET | Freq: Every day | ORAL | 0 refills | Status: DC

## 2023-06-12 NOTE — Progress Notes (Unsigned)
   Acute Office Visit  Subjective:     Patient ID: Raymond Hardin, male    DOB: [birth year], 83 y.o.   MRN: [id number]  No chief complaint on file.   HPI Patient is in today for ***  ROS      Objective:    There were no vitals taken for this visit. {Vitals History (Optional):23777}  Physical Exam  No results found for any visits on 06/12/23.      Assessment & Plan:      No orders of the defined types were placed in this encounter.   No follow-ups on file.  Tandy Gaw, PA-C

## 2023-06-12 NOTE — Patient Instructions (Addendum)
Need compression socks to wear all the time up to knee  Stasis Dermatitis Stasis dermatitis is a long-term (chronic) skin condition that happens when you have poor circulation. This is when your veins can no longer pump blood back to the heart like they should. This condition causes a red or brown scaly rash or sores (ulcers). It often affects the lower legs. It may affect one leg or both legs. Without treatment, this condition can lead to other skin problems. What are the causes? This condition is caused by poor circulation. What increases the risk? You are more likely to get this condition if: You are not very active. You have to stand for long periods. You have varicose veins. These are veins that are enlarged and twisted. You have venous insufficiency. This is when your leg veins struggle to send blood back to your heart. You have been pregnant many times. You have had vein surgery or injuries to your legs in the past. You have heart or kidney failure. You have had a blood clot. You are 81 years of age or older and are overweight. What are the signs or symptoms? Common early symptoms of this condition include: Itching in one or both of your legs. Swelling in your ankle or leg. This may get better at night but be worse during the day. Skin that looks thin on your ankle and leg. Skin that is dry, cracked, or easily irritated. Red or brown marks that form slowly. Red, swollen skin that burns or is sore. An achy or heavy feeling after you walk or stand for a long time. Pain. Later and more severe symptoms of this condition include: Skin that looks shiny. Ulcers. These are often red or purple and leak fluid. Skin that feels hard. Severe itching. A change in the shape or color of your lower legs. Severe pain. Trouble walking. How is this diagnosed? This condition may be diagnosed based on your symptoms, medical history, and a physical exam. You may also have tests. These may  include: Blood tests. Doppler ultrasound. This can check blood flow. Allergy tests. You may need to see a health care provider who is an expert in skin diseases (dermatologist). How is this treated? This condition may be treated with: Compression stockings or an elastic wrap. Medicines, such as: Corticosteroid creams and ointments. Other medicines applied to the skin. Medicines to reduce swelling in the legs (diuretics). Antibiotics. Medicines to help with itching (antihistamines). A bandage (dressing). A wrap that has zinc and gelatin on it (Unna boot). Follow these instructions at home: Medicines Take or use over-the-counter and prescription medicines only as told by your provider. If you were prescribed antibiotics, take or use them as told by your provider. Do not stop taking or using the antibiotic even if you start to feel better. Skin care Moisturize your skin as told by your provider. Do not use scented moisturizers, soaps, detergents, or perfumes. These can irritate your skin. Apply a cool, wet cloth (cool compress) to the affected areas. Do not scratch your skin. Do not rub your skin dry after a bath or shower. Gently pat your skin dry. Check the affected areas every day for signs of infection. Check for: More redness, swelling, or pain. More fluid or blood. Warmth. Pus or a bad smell. Activity Walk as told by your provider. Walking increases blood flow. Do calf and ankle exercises during the day as told by your provider. This will help increase blood flow. Raise (elevate) your legs above  the level of your heart while you are sitting or lying down. Lifestyle Work with your provider to lose weight, if needed. Do not cross your legs when you sit. Do not stand or sit in one position for a long time. Wear comfortable, loose-fitting clothes. Circulation in your legs will be worse if you wear tight pants, belts, and waistbands. Do not use any products that contain nicotine  or tobacco. These products include cigarettes, chewing tobacco, and vaping devices, such as e-cigarettes. If you need help quitting, ask your provider. General instructions Follow instructions from your provider on how to remove and change any dressings. Wear compression stockings as told by your provider. These stockings help to prevent blood clots and reduce swelling in your legs. If you were told to wear an Radio broadcast assistant, do so as told by your provider. Keep all follow-up visits. Your provider will check your skin for any changes and adjust your treatment plan as needed. Contact a health care provider if: Your condition does not get better with treatment. Your condition gets worse. You have any signs of infection. You see red streaks coming from the affected area. The affected area turns darker. You have a fever. Get help right away if: Your bone or joint under the affected area becomes painful after the skin has healed. You feel a deep pain in your leg or groin. You are short of breath. These symptoms may be an emergency. Get help right away. Call 911. Do not wait to see if the symptoms will go away. Do not drive yourself to the hospital. This information is not intended to replace advice given to you by your health care provider. Make sure you discuss any questions you have with your health care provider. Document Revised: 05/10/2022 Document Reviewed: 05/10/2022 Elsevier Patient Education  2024 [employer].

## 2023-06-14 ENCOUNTER — Encounter: Payer: Self-pay | Admitting: Physician Assistant

## 2023-06-14 ENCOUNTER — Telehealth: Payer: Self-pay | Admitting: Physician Assistant

## 2023-06-14 NOTE — Telephone Encounter (Signed)
 Copied from CRM [id number]. Topic: General - Other >> Jun 14, 2023  3:45 PM Monisha R wrote: Reason for CRM: Emmie Louder from [facility] calling because Patient had missed appointment due to patient has bed bugs and not a safe work environment.

## 2023-06-19 ENCOUNTER — Telehealth: Payer: Self-pay | Admitting: Physician Assistant

## 2023-06-19 NOTE — Telephone Encounter (Signed)
 Copied from CRM [id number]. Topic: General - Other >> Jun 19, 2023  9:02 AM Georgeann Kindred wrote: Reason for CRM: Louanna Rouse with [facility] [phone] stated that Mr. Zubair last day of physical therapy will be today 02/10 as he has reached his maximum rehab potential. In addition, patient does not live in a home he lives in a motel with a bug infestation in the room.

## 2023-07-09 ENCOUNTER — Other Ambulatory Visit: Payer: Self-pay | Admitting: Physician Assistant

## 2023-07-09 DIAGNOSIS — E782 Mixed hyperlipidemia: Secondary | ICD-10-CM

## 2023-08-30 ENCOUNTER — Telehealth: Payer: Self-pay | Admitting: Physician Assistant

## 2023-08-30 NOTE — Telephone Encounter (Signed)
 Patient dropped off document DMV, to be filled out by provider. Patient requested to send it back via Call Patient to pick up within 7-days. Document is located in providers tray at front office.Please advise at [facility] [phone]

## 2023-08-30 NOTE — Telephone Encounter (Signed)
 Patient informed.

## 2023-08-31 ENCOUNTER — Ambulatory Visit

## 2023-08-31 VITALS — Ht 70.5 in | Wt 248.0 lb

## 2023-08-31 DIAGNOSIS — Z0000 Encounter for general adult medical examination without abnormal findings: Secondary | ICD-10-CM | POA: Diagnosis not present

## 2023-08-31 DIAGNOSIS — E119 Type 2 diabetes mellitus without complications: Secondary | ICD-10-CM

## 2023-08-31 DIAGNOSIS — Z122 Encounter for screening for malignant neoplasm of respiratory organs: Secondary | ICD-10-CM

## 2023-08-31 NOTE — Progress Notes (Signed)
 Subjective:   Raymond Hardin is a 83 y.o. male who presents for Medicare Annual/Subsequent preventive examination.  Visit Complete: Virtual I connected with  Raymond Hardin on 08/31/23 by a audio enabled telemedicine application and verified that I am speaking with the correct person using two identifiers.  Patient Location: Home  Provider Location: Office/Clinic  I discussed the limitations of evaluation and management by telemedicine. The patient expressed understanding and agreed to proceed.  Vital Signs: Because this visit was a virtual/telehealth visit, some criteria may be missing or patient reported. Any vitals not documented were not able to be obtained and vitals that have been documented are patient reported.  Patient Medicare AWV questionnaire was completed by the patient on n/a; I have confirmed that all information answered by patient is correct and no changes since this date.  Cardiac Risk Factors include: diabetes mellitus;advanced age (>57men, >15 women);smoking/ tobacco exposure;family history of premature cardiovascular disease;male gender;hypertension;obesity (BMI >30kg/m2);sedentary lifestyle     Objective:    Today's Vitals   08/31/23 1340  Weight: 248 lb (112.5 kg)  Height: 5' 10.5" (1.791 m)   Body mass index is 35.08 kg/m.     08/31/2023    2:02 PM 08/22/2022    2:09 PM 07/14/2021    2:25 PM 06/08/2020    9:12 AM 02/04/2019    1:57 PM 01/22/2018    1:22 PM  Advanced Directives  Does Patient Have a Medical Advance Directive? No No No Yes No No  Type of Theme park manager;Living will    Does patient want to make changes to medical advance directive?    No - Patient declined    Copy of Healthcare Power of Attorney in Chart?    No - copy requested    Would patient like information on creating a medical advance directive? No - Patient declined No - Patient declined No - Patient declined  No - Patient declined Yes  (MAU/Ambulatory/Procedural Areas - Information given)    Current Medications (verified) Outpatient Encounter Medications as of 08/31/2023  Medication Sig   ACCU-CHEK GUIDE TEST test strip TEST BLOOD SUGAR THREE TIMES DAILY AS DIRECTED   Accu-Chek Softclix Lancets lancets 3 each by Other route daily. Use as directed for  TID blood sugar checks   albuterol  (VENTOLIN  HFA) 108 (90 Base) MCG/ACT inhaler Inhale 2 puffs into the lungs every 6 (six) hours as needed.   ALPRAZolam  (XANAX ) 0.5 MG tablet TAKE 1 TABLET(0.5 MG) BY MOUTH AT BEDTIME AS NEEDED FOR ANXIETY   amLODipine  (NORVASC ) 2.5 MG tablet Take 1 tablet (2.5 mg total) by mouth daily. For blood pressure.   ARIPiprazole  (ABILIFY ) 2 MG tablet Take one tablet daily.   atorvastatin  (LIPITOR) 20 MG tablet TAKE 1 TABLET EVERY DAY   Blood Glucose Monitoring Suppl (ACCU-CHEK GUIDE) w/Device KIT USE AS DIRECTED TO CHECK BLOOD SUGAR MORNING NOON AND AT BEDTIME   busPIRone  (BUSPAR ) 15 MG tablet Take 1 tablet (15 mg total) by mouth 3 (three) times daily.   cholecalciferol (VITAMIN D ) 1000 units tablet Take 1,000 Units by mouth daily.   dapagliflozin  propanediol (FARXIGA ) 10 MG TABS tablet Take 1 tablet (10 mg total) by mouth daily.   Fluticasone -Umeclidin-Vilant (TRELEGY ELLIPTA ) 100-62.5-25 MCG/ACT AEPB Inhale 1 puff into the lungs daily.   glucose blood (ACCU-CHEK GUIDE) test strip 3 each by Other route as needed for other. Use as directed for TID blood sugar checks   glucose blood (ACCU-CHEK GUIDE) test strip  3 each by Other route daily. Use as directed for TID blood sugar checks.   metFORMIN  (GLUCOPHAGE ) 1000 MG tablet Take one tablet twice a day.   metoprolol  tartrate (LOPRESSOR ) 50 MG tablet TAKE ONE TABLET BY MOUTH TWICE A DAY FOR HEART   sildenafil  (REVATIO ) 20 MG tablet TAKE 5 TABLETS BY MOUTH AS NEEDED FOR ERECTILE DYSFUNCTION.   valsartan  (DIOVAN ) 320 MG tablet Take 1 tablet (320 mg total) by mouth daily. For blood pressure.   vortioxetine  HBr  (TRINTELLIX ) 20 MG TABS tablet Take 1 tablet (20 mg total) by mouth daily.   XARELTO  20 MG TABS tablet Take 1 tablet (20 mg total) by mouth daily.   nicotine  (NICODERM CQ ) 14 mg/24hr patch Place 1 patch (14 mg total) onto the skin daily. (Patient not taking: Reported on 08/31/2023)   nicotine  (NICODERM CQ ) 7 mg/24hr patch Place 1 patch (7 mg total) onto the skin daily. (Patient not taking: Reported on 08/31/2023)   No facility-administered encounter medications on file as of 08/31/2023.    Allergies (verified) Patient has no known allergies.   History: Past Medical History:  Diagnosis Date   COPD (chronic obstructive pulmonary disease) (HCC)    COVID-19 03/2019   Depression    Diabetes mellitus without complication (HCC)    Hyperlipidemia    Hypertension    Insomnia 2005   Osteoarthritis 2000   Paroxysmal A-fib (HCC)    Pneumonia    Past Surgical History:  Procedure Laterality Date   CATARACT EXTRACTION, BILATERAL  2020   EYE SURGERY     JOINT REPLACEMENT     left shoulder replacement  2011   REPLACEMENT TOTAL KNEE Bilateral    Family History  Problem Relation Age of Onset   Cancer Mother    Suicidality Father 61   Depression Father    COPD Sister    Social History   Socioeconomic History   Marital status: Legally Separated    Spouse name: Not on file   Number of children: 1   Years of education: 12   Highest education level: 12th grade  Occupational History   Occupation: retired    Comment: truck Hospital doctor  Tobacco Use   Smoking status: Every Day    Current packs/day: 0.50    Average packs/day: 0.5 packs/day for 62.7 years (31.4 ttl pk-yrs)    Types: Cigarettes    Start date: 12/13/1960   Smokeless tobacco: Current    Types: Chew  Vaping Use   Vaping status: Never Used  Substance and Sexual Activity   Alcohol use: Not Currently    Alcohol/week: 0.0 standard drinks of alcohol    Comment: occasional   Drug use: No   Sexual activity: Yes    Partners: Female   Other Topics Concern   Not on file  Social History Narrative   Lives alone. Likes to drive his motorcycle; but he has sold his but hoping to getting another one. He enjoys watching television.   Social Drivers of Corporate investment banker Strain: Low Risk  (08/31/2023)   Overall Financial Resource Strain (CARDIA)    Difficulty of Paying Living Expenses: Not hard at all  Food Insecurity: No Food Insecurity (08/31/2023)   Hunger Vital Sign    Worried About Running Out of Food in the Last Year: Never true    Ran Out of Food in the Last Year: Never true  Transportation Needs: No Transportation Needs (08/31/2023)   PRAPARE - Administrator, Civil Service (Medical):  No    Lack of Transportation (Non-Medical): No  Physical Activity: Sufficiently Active (08/31/2023)   Exercise Vital Sign    Days of Exercise per Week: 7 days    Minutes of Exercise per Session: 30 min  Stress: No Stress Concern Present (08/31/2023)   [employer] of Occupational Health - Occupational Stress Questionnaire    Feeling of Stress : Not at all  Social Connections: Moderately Isolated (08/31/2023)   Social Connection and Isolation Panel [NHANES]    Frequency of Communication with Friends and Family: More than three times a week    Frequency of Social Gatherings with Friends and Family: More than three times a week    Attends Religious Services: Never    Database administrator or Organizations: Yes    Attends Engineer, structural: More than 4 times per year    Marital Status: Separated    Tobacco Counseling Ready to quit: Not Answered Counseling given: Not Answered   Clinical Intake:  Pre-visit preparation completed: Yes  Pain : No/denies pain     BMI - recorded: 35.08 Nutritional Status: BMI > 30  Obese Nutritional Risks: None Diabetes: No  How often do you need to have someone help you when you read instructions, pamphlets, or other written materials from your doctor or  pharmacy?: 1 - Never What is the last grade level you completed in school?: 12  Interpreter Needed?: No      Activities of Daily Living    08/31/2023    1:43 PM  In your present state of health, do you have any difficulty performing the following activities:  Hearing? 0  Vision? 0  Difficulty concentrating or making decisions? 0  Walking or climbing stairs? 1  Dressing or bathing? 0  Doing errands, shopping? 0  Preparing Food and eating ? N  Using the Toilet? N  In the past six months, have you accidently leaked urine? N  Do you have problems with loss of bowel control? N  Managing your Medications? N  Managing your Finances? N  Housekeeping or managing your Housekeeping? N    Patient Care Team: Breeback, Jade L, PA-C as PCP - General (Family Medicine)  Indicate any recent Medical Services you may have received from other than [facility] providers in the past year (date may be approximate).     Assessment:   This is a routine wellness examination for Raymond Hardin.  Hearing/Vision screen No results found.   Goals Addressed             This Visit's Progress    Patient Stated       Patient stated he would like to lose weight.       Depression Screen    08/31/2023    2:01 PM 06/14/2023    9:59 AM 08/22/2022    2:10 PM 04/27/2022   10:45 AM 08/24/2021    3:21 PM 07/19/2021    2:07 PM 07/14/2021    2:26 PM  PHQ 2/9 Scores  PHQ - 2 Score 1 0 0 0 1 3 0  PHQ- 9 Score  0   3 14 0    Fall Risk    08/31/2023    2:03 PM 08/22/2022    2:09 PM 08/22/2022    7:24 AM 07/14/2021    2:24 PM 09/15/2020    1:36 PM  Fall Risk   Falls in the past year? 1 1 1 1  0  Number falls in past yr: 1 1 0 0 0  Injury with Fall? 1 1 1 1  0  Risk for fall due to : Impaired mobility History of fall(s);Impaired mobility;Impaired balance/gait History of fall(s);Impaired balance/gait;Impaired mobility History of fall(s) No Fall Risks  Follow up Falls evaluation completed Falls evaluation  completed;Education provided;Falls prevention discussed Falls evaluation completed;Education provided;Falls prevention discussed Falls evaluation completed Falls evaluation completed    MEDICARE RISK AT HOME: Medicare Risk at Home Any stairs in or around the home?: No Home free of loose throw rugs in walkways, pet beds, electrical cords, etc?: Yes Adequate lighting in your home to reduce risk of falls?: Yes Life alert?: Yes Use of a cane, walker or w/c?: Yes Grab bars in the bathroom?: Yes Shower chair or bench in shower?: Yes Elevated toilet seat or a handicapped toilet?: Yes  TIMED UP AND GO:  Was the test performed?  No    Cognitive Function:        08/31/2023    2:05 PM 08/22/2022    2:17 PM 07/14/2021    2:31 PM 06/08/2020    9:14 AM 02/04/2019    2:00 PM  6CIT Screen  What Year? 0 points 0 points 0 points 0 points 0 points  What month? 0 points 0 points 0 points 0 points 0 points  What time? 0 points 0 points 0 points 0 points 0 points  Count back from 20 0 points 0 points 0 points 0 points 0 points  Months in reverse 2 points 0 points 4 points 2 points 0 points  Repeat phrase 0 points 0 points 0 points 0 points 0 points  Total Score 2 points 0 points 4 points 2 points 0 points    Immunizations Immunization History  Administered Date(s) Administered   Fluad Quad(high Dose 65+) 01/25/2019, 05/15/2020, 04/16/2021   Influenza, High Dose Seasonal PF 02/27/2009, 04/21/2023   Influenza,inj,Quad PF,6+ Mos 01/23/2013, 02/23/2015, 03/01/2016, 01/31/2017, 01/05/2018   Influenza-Unspecified 03/09/2004, 04/20/2005, 04/25/2006, 05/24/2007, 02/08/2008, 03/23/2010, 02/02/2011, 03/09/2012, 01/07/2013, 05/15/2020, 04/16/2022   Janssen (J&J) SARS-COV-2 Vaccination 08/16/2019, 05/29/2020   Moderna Covid-19 Fall Seasonal Vaccine 45yrs & older 04/16/2022, 04/21/2023   Moderna Covid-19 Vaccine Bivalent Booster 63yrs & up 04/16/2021   Moderna Sars-Covid-2 Vaccination 12/18/2020    Pneumococcal Conjugate-13 08/13/2014   Pneumococcal Polysaccharide-23 07/01/2011, 04/16/2013, 11/11/2020   Pneumococcal-Unspecified 05/07/2004   Td 06/30/2020   Td (Adult), 2 Lf Tetanus Toxid, Preservative Free 06/30/2020   Tdap 05/10/2007, 01/05/2018   Zoster Recombinant(Shingrix) 06/30/2020, 09/30/2020    TDAP status: Up to date  Flu Vaccine status: Up to date  Pneumococcal vaccine status: Up to date  Covid-19 vaccine status: Information provided on how to obtain vaccines.   Qualifies for Shingles Vaccine? Yes   Zostavax completed No   Shingrix Completed?: Yes  Screening Tests Health Maintenance  Topic Date Due   OPHTHALMOLOGY EXAM  Never done   Lung Cancer Screening  Never done   HEMOGLOBIN A1C  02/15/2023   Diabetic kidney evaluation - Urine ACR  04/28/2023   FOOT EXAM  04/28/2023   Diabetic kidney evaluation - eGFR measurement  08/16/2023   COVID-19 Vaccine (7 - 2024-25 season) 10/20/2023   INFLUENZA VACCINE  12/08/2023   Medicare Annual Wellness (AWV)  08/30/2024   DTaP/Tdap/Td (5 - Td or Tdap) 06/30/2030   Pneumonia Vaccine 54+ Years old  Completed   Zoster Vaccines- Shingrix  Completed   HPV VACCINES  Aged Out   Meningococcal B Vaccine  Aged Out   Colonoscopy  Discontinued   Hepatitis C Screening  Discontinued  Health Maintenance  Health Maintenance Due  Topic Date Due   OPHTHALMOLOGY EXAM  Never done   Lung Cancer Screening  Never done   HEMOGLOBIN A1C  02/15/2023   Diabetic kidney evaluation - Urine ACR  04/28/2023   FOOT EXAM  04/28/2023   Diabetic kidney evaluation - eGFR measurement  08/16/2023    Colorectal cancer screening: No longer required.   Lung Cancer Screening: (Low Dose CT Chest recommended if Age 34-80 years, 20 pack-year currently smoking OR have quit w/in 15years.) does qualify.   Lung Cancer Screening Referral: referral placed.   Additional Screening:  Hepatitis C Screening: does not qualify; Completed 04/01/2019  Vision  Screening: Recommended annual ophthalmology exams for early detection of glaucoma and other disorders of the eye. Is the patient up to date with their annual eye exam?  No  Who is the provider or what is the name of the office in which the patient attends annual eye exams?  If pt is not established with a provider, would they like to be referred to a provider to establish care? Yes .   Dental Screening: Recommended annual dental exams for proper oral hygiene  Diabetic Foot Exam: Diabetic Foot Exam: Overdue, Pt has been advised about the importance in completing this exam. Pt is scheduled for diabetic foot exam on 09/05/2023.  Community Resource Referral / Chronic Care Management: CRR required this visit?  Yes   CCM required this visit?  No     Plan:     I have personally reviewed and noted the following in the patient's chart:   Medical and social history Use of alcohol, tobacco or illicit drugs  Current medications and supplements including opioid prescriptions. Patient is not currently taking opioid prescriptions. Functional ability and status Nutritional status Physical activity Advanced directives List of other physicians Hospitalizations # 1, surgeries, and ER # 2 visits in previous 12 months Vitals Screenings to include cognitive, depression, and falls Referrals and appointments  In addition, I have reviewed and discussed with patient certain preventive protocols, quality metrics, and best practice recommendations. A written personalized care plan for preventive services as well as general preventive health recommendations were provided to patient.     Aubrey Leaf, CMA   08/31/2023   After Visit Summary: (Mail) Due to this being a telephonic visit, the after visit summary with patients personalized plan was offered to patient via mail   Nurse Notes:   Raymond Hardin is a 83 y.o. male patient of Araceli Knight, PA-C who had a Medicare Annual Wellness Visit  today via telephone. Raymond Hardin is Retired and lives alone. He has 1 child. he reports that he is socially active and does interact with friends regularly. He is minimally physically active and enjoys watching television. He loves westerns.  Referral placed for DM eye exam and lung cancer screening.

## 2023-08-31 NOTE — Patient Instructions (Signed)
 Raymond Hardin , Thank you for taking time to come for your Medicare Wellness Visit. I appreciate your ongoing commitment to your health goals. Please review the following plan we discussed and let me know if I can assist you in the future.   These are the goals we discussed:  Goals       DIET - INCREASE WATER INTAKE      Goal is to drink at least 32 ounces of water a day. Watch salt intake daily.      DIET - INCREASE WATER INTAKE      Increase water intake to at least 3 bottles a day to start      Patient Stated (pt-stated)      06/08/2020 AWV Goal: Tobacco Cessation  Smoking cessation instruction/counseling given:  counseled patient on the dangers of tobacco use, advised patient to stop smoking, and reviewed strategies to maximize success  Patient will verbalize understanding of the health risks associated with smoking/tobacco use Lung cancer or lung disease, such as COPD Heart disease. Stroke. Heart attack Infertility Osteoporosis and bone fractures. Patient will create a plan to quit smoking/using tobacco Pick a date to quit.  Write down the reasons why you are quitting and put it where you will see it often. Identify the people, places, things, and activities that make you want to smoke (triggers) and avoid them. Make sure to take these actions: Throw away all cigarettes at home, at work, and in your car. Throw away smoking accessories, such as Set designer. Clean your car and make sure to empty the ashtray. Clean your home, including curtains and carpets. Tell your family, friends, and coworkers that you are quitting. Support from your loved ones can make quitting easier. Talk with your health care provider about your options for quitting smoking. Find out what treatment options are covered by your health insurance. Patient will be able to demonstrate knowledge of tobacco cessation strategies that may maximize success Quitting "cold [place]" is more successful than  gradually quitting. Attending in-person counseling to help you build problem-solving skills.  Finding resources and support systems that can help you to quit smoking and remain smoke-free after you quit. These resources are most helpful when you use them often. They can include: Online chats with a Veterinary surgeon. Telephone quitlines. Printed Materials engineer. Support groups or group counseling. Text messaging programs. Mobile phone applications. Taking medicines to help you quit smoking: Nicotine  patches, gum, or lozenges. Nicotine  inhalers or sprays. Non-nicotine  medicine that is taken by mouth. Patient will note get discouraged if the process is difficult Over the next year, patient will stop smoking or using other forms of tobacco  Smoking cessation instruction/counseling given:  counseled patient on the dangers of tobacco use, advised patient to stop smoking, and reviewed strategies to maximize success        Patient Stated (pt-stated)      07/14/2021 AWV Goal: Exercise for General Health  Patient will verbalize understanding of the benefits of increased physical activity: Exercising regularly is important. It will improve your overall fitness, flexibility, and endurance. Regular exercise also will improve your overall health. It can help you control your weight, reduce stress, and improve your bone density. Over the next year, patient will increase physical activity as tolerated with a goal of at least 150 minutes of moderate physical activity per week.  You can tell that you are exercising at a moderate intensity if your heart starts beating faster and you start breathing faster but can still  hold a conversation. Moderate-intensity exercise ideas include: Walking 1 mile (1.6 km) in about 15 minutes Biking Hiking Golfing Dancing Water aerobics Patient will verbalize understanding of everyday activities that increase physical activity by providing examples like the following: Yard  work, such as: Insurance underwriter Gardening Washing windows or floors Patient will be able to explain general safety guidelines for exercising:  Before you start a new exercise program, talk with your health care provider. Do not exercise so much that you hurt yourself, feel dizzy, or get very short of breath. Wear comfortable clothes and wear shoes with good support. Drink plenty of water while you exercise to prevent dehydration or heat stroke. Work out until your breathing and your heartbeat get faster.       Patient Stated (pt-stated)      Patient stated he would like to continue to stay healthy.      Patient Stated      Patient stated he would like to lose weight.         This is a list of the screening recommended for you and due dates:  Health Maintenance  Topic Date Due   Eye exam for diabetics  Never done   Screening for Lung Cancer  Never done   Hemoglobin A1C  02/15/2023   Yearly kidney health urinalysis for diabetes  04/28/2023   Complete foot exam   04/28/2023   Yearly kidney function blood test for diabetes  08/16/2023   COVID-19 Vaccine (7 - 2024-25 season) 10/20/2023   Flu Shot  12/08/2023   Medicare Annual Wellness Visit  08/30/2024   DTaP/Tdap/Td vaccine (5 - Td or Tdap) 06/30/2030   Pneumonia Vaccine  Completed   Zoster (Shingles) Vaccine  Completed   HPV Vaccine  Aged Out   Meningitis B Vaccine  Aged Out   Colon Cancer Screening  Discontinued   Hepatitis C Screening  Discontinued

## 2023-09-05 ENCOUNTER — Ambulatory Visit: Admitting: Physician Assistant

## 2023-09-05 VITALS — BP 138/88 | HR 80

## 2023-09-05 DIAGNOSIS — I872 Venous insufficiency (chronic) (peripheral): Secondary | ICD-10-CM

## 2023-09-05 DIAGNOSIS — E1122 Type 2 diabetes mellitus with diabetic chronic kidney disease: Secondary | ICD-10-CM | POA: Diagnosis not present

## 2023-09-05 DIAGNOSIS — I10 Essential (primary) hypertension: Secondary | ICD-10-CM | POA: Diagnosis not present

## 2023-09-05 DIAGNOSIS — N1831 Chronic kidney disease, stage 3a: Secondary | ICD-10-CM | POA: Diagnosis not present

## 2023-09-05 DIAGNOSIS — Z7984 Long term (current) use of oral hypoglycemic drugs: Secondary | ICD-10-CM

## 2023-09-05 DIAGNOSIS — Z7409 Other reduced mobility: Secondary | ICD-10-CM

## 2023-09-05 DIAGNOSIS — L03116 Cellulitis of left lower limb: Secondary | ICD-10-CM

## 2023-09-05 DIAGNOSIS — R600 Localized edema: Secondary | ICD-10-CM

## 2023-09-05 DIAGNOSIS — J4489 Other specified chronic obstructive pulmonary disease: Secondary | ICD-10-CM | POA: Diagnosis not present

## 2023-09-05 DIAGNOSIS — Z789 Other specified health status: Secondary | ICD-10-CM | POA: Diagnosis not present

## 2023-09-05 LAB — POCT GLYCOSYLATED HEMOGLOBIN (HGB A1C): Hemoglobin A1C: 6.2 % — AB (ref 4.0–5.6)

## 2023-09-05 LAB — POCT UA - MICROALBUMIN
Creatinine, POC: 200 mg/dL
Microalbumin Ur, POC: 80 mg/L

## 2023-09-05 MED ORDER — DOXYCYCLINE HYCLATE 100 MG PO TABS: 100.0000 mg | ORAL_TABLET | Freq: Two times a day (BID) | ORAL | 0 refills | Status: DC

## 2023-09-05 MED ORDER — HYDROCHLOROTHIAZIDE 12.5 MG PO TABS: 12.5000 mg | ORAL_TABLET | Freq: Every day | ORAL | 0 refills | Status: DC

## 2023-09-05 NOTE — Patient Instructions (Addendum)
 Avid personal care referral Start hydrochlorothiazide  Doxycycline for 10 days Unna boot and follow up next week

## 2023-09-05 NOTE — Progress Notes (Signed)
 Established Patient Office Visit  Subjective   Patient ID: Raymond Hardin, male    DOB: Sep 24, 1940  Age: 83 y.o. MRN: [id number]  Chief Complaint  Patient presents with   Medical Management of Chronic Issues    HPI Pt is a 83 yo male with HTN, COPD, chronic venous stasis, PAF, T2DM who presents to the clinic with redness, swelling, warmth of left leg. Pt admits he is haiving a hard time keeping himself bathed. He is unstable on his feet. He legs swell from time to time. Left worse than right. His left leg is tender, red, and swollen today. He does not wear compression. He is a smoker and generally out of breath. He does use his inhalers.   Patient Active Problem List   Diagnosis Date Noted   Impaired mobility and ADLs 09/11/2023   Leg edema, left 09/11/2023   Left leg cellulitis 09/05/2023   Cellulitis of left lower extremity 06/12/2023   Venous stasis dermatitis 06/12/2023   Traumatic rhabdomyolysis (HCC) 08/22/2022   Tobacco dependence 05/24/2022   History of influenza 05/24/2022   Toenail fungus 04/27/2022   Polyneuropathy associated with underlying disease (HCC) 04/27/2022   Type 2 diabetes mellitus with stage 3a chronic kidney disease, without long-term current use of insulin (HCC) 09/15/2020   Cough 11/04/2019   SOB (shortness of breath) on exertion 10/02/2019   History of COVID-19 06/11/2019   Mucopurulent chronic bronchitis (HCC) 06/11/2019   Hypotension 05/29/2019   Acute encephalopathy 05/16/2019   COPD (chronic obstructive pulmonary disease) with chronic bronchitis (HCC) 05/16/2019   PAF (paroxysmal atrial fibrillation) (HCC) 05/13/2019   Low blood potassium 05/13/2019   Acute bilateral low back pain without sciatica 01/28/2019   Palpitation 09/19/2018   Skipped heart beats 09/17/2018   Strong pulse 09/17/2018   Recurrent major depressive disorder, in partial remission (HCC) 06/19/2018   S/P bilateral cataract extraction 05/25/2018   Posterior vitreous  detachment of left eye 04/09/2018   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   Bilateral cataracts 01/05/2018   S/P total knee replacement, right 01/31/2017   CKD (chronic kidney disease), stage III (HCC) 12/07/2015   Hyperlipidemia 02/24/2015   Hypertriglyceridemia 02/24/2015   Status post left knee replacement 08/13/2014   Anxiety state 08/13/2014   Essential hypertension, benign 08/13/2014   Pre-diabetes 08/16/2013   Vitamin D  deficiency 08/13/2013   Primary osteoarthritis of right knee 04/24/2013   Tobacco abuse 12/12/2012   Depression 12/12/2012   Erectile dysfunction 11/04/2011   Past Medical History:  Diagnosis Date   COPD (chronic obstructive pulmonary disease) (HCC)    COVID-19 03/2019   Depression    Diabetes mellitus without complication (HCC)    Hyperlipidemia    Hypertension    Insomnia 2005   Osteoarthritis 2000   Paroxysmal A-fib (HCC)    Pneumonia    Past Surgical History:  Procedure Laterality Date   CATARACT EXTRACTION, BILATERAL  2020   EYE SURGERY     JOINT REPLACEMENT     left shoulder replacement  2011   REPLACEMENT TOTAL KNEE Bilateral    Family History  Problem Relation Age of Onset   Cancer Mother    Suicidality Father 27   Depression Father    COPD Sister    No Known Allergies    ROS See HPI.    Objective:     BP 138/88 (Cuff Size: Normal)   Pulse 80  BP Readings from Last 3 Encounters:  09/05/23 138/88  06/12/23 124/82  05/26/23 (!) 167/82  Wt Readings from Last 3 Encounters:  08/31/23 248 lb (112.5 kg)  05/26/23 242 lb (109.8 kg)  08/16/22 233 lb (105.7 kg)      Physical Exam Constitutional:      Appearance: He is obese.     Comments: Smelled of urine and not well groomed  HENT:     Head: Normocephalic.  Cardiovascular:     Rate and Rhythm: Normal rate and regular rhythm.  Pulmonary:     Effort: Pulmonary effort is normal.     Breath sounds: Normal breath sounds.  Musculoskeletal:     Right lower leg: No edema.      Comments: Left leg 2+pitting edema from foot to mid left leg with warmth, tenderness. No areas of drainage or weeping.   Neurological:     General: No focal deficit present.     Mental Status: He is alert and oriented to person, place, and time.  Psychiatric:        Mood and Affect: Mood normal.    .. Lab Results  Component Value Date   HGBA1C 6.2 (A) 09/05/2023         Assessment & Plan:  Aaron AasAaron AasDerrol was seen today for medical management of chronic issues.  Diagnoses and all orders for this visit:  Type 2 diabetes mellitus with stage 3a chronic kidney disease, without long-term current use of insulin (HCC) -     POCT UA - Microalbumin -     POCT glycosylated hemoglobin (Hb A1C) -     CMP14+EGFR  Left leg cellulitis -     doxycycline  (VIBRA -TABS) 100 MG tablet; Take 1 tablet (100 mg total) by mouth 2 (two) times daily.  Leg edema, left -     hydrochlorothiazide  (HYDRODIURIL ) 12.5 MG tablet; Take 1 tablet (12.5 mg total) by mouth daily.  Impaired mobility and ADLs -     Ambulatory referral to Home Health  Essential hypertension, benign  Venous stasis dermatitis  COPD (chronic obstructive pulmonary disease) with chronic bronchitis (HCC)   Left leg swollen, red, warm Start doxycycline  Wrapped in unna boot today and follow up in 1 week Keep legs elevated Start hydrochlorothiazide  daily Cmp ordered today  Pt is struggling with bathing and keeping clean AVID personal aid referral placed today  A1C to goal Continue same medications Cmp ordered today BP to goal On statin Vaccines UTD Needs eye exam Follow up in 3 months   Declines smoking cessation.    Return in about 1 week (around 09/12/2023) for leg swelling.    Addiel Mccardle, PA-C

## 2023-09-11 ENCOUNTER — Encounter: Payer: Self-pay | Admitting: Physician Assistant

## 2023-09-11 DIAGNOSIS — R600 Localized edema: Secondary | ICD-10-CM | POA: Insufficient documentation

## 2023-09-11 DIAGNOSIS — Z789 Other specified health status: Secondary | ICD-10-CM | POA: Insufficient documentation

## 2023-09-12 ENCOUNTER — Ambulatory Visit (INDEPENDENT_AMBULATORY_CARE_PROVIDER_SITE_OTHER): Admitting: Physician Assistant

## 2023-09-12 ENCOUNTER — Encounter: Payer: Self-pay | Admitting: Physician Assistant

## 2023-09-12 VITALS — BP 138/64 | HR 75 | Ht 70.0 in | Wt 247.0 lb

## 2023-09-12 DIAGNOSIS — L03116 Cellulitis of left lower limb: Secondary | ICD-10-CM

## 2023-09-12 DIAGNOSIS — I872 Venous insufficiency (chronic) (peripheral): Secondary | ICD-10-CM

## 2023-09-12 DIAGNOSIS — R600 Localized edema: Secondary | ICD-10-CM

## 2023-09-12 MED ORDER — HYDROCHLOROTHIAZIDE 12.5 MG PO TABS: 12.5000 mg | ORAL_TABLET | Freq: Every day | ORAL | 1 refills | Status: DC

## 2023-09-12 NOTE — Patient Instructions (Signed)
 Keep Compression socks on and feet elevated Continue to take hydrochlorothiazide  12.5mg  daily for swelling and BP

## 2023-09-17 ENCOUNTER — Encounter: Payer: Self-pay | Admitting: Physician Assistant

## 2023-09-17 NOTE — Progress Notes (Signed)
   Established Patient Office Visit  Subjective   Patient ID: Raymond Hardin, male    DOB: 1940/11/09  Age: 83 y.o. MRN: [id number]  Chief Complaint  Patient presents with   Medical Management of Chronic Issues    HPI Pt is a 83 yo male who presents to the clinic for 1 week follow up on left leg cellulitis. Unna boot was placed last visit.  He was also given doxycycline  to take. Pt has kept the unna boot on. He is taking HcTZ 12.5mg . not checking BP at home.    ROS See HPI.    Objective:     BP 138/64   Pulse 75   Ht 5\' 10"  (1.778 m)   Wt 247 lb (112 kg)   SpO2 90%   BMI 35.44 kg/m  BP Readings from Last 3 Encounters:  09/12/23 138/64  09/05/23 138/88  06/12/23 124/82   Wt Readings from Last 3 Encounters:  09/12/23 247 lb (112 kg)  08/31/23 248 lb (112.5 kg)  05/26/23 242 lb (109.8 kg)      Physical Exam Constitutional:      Appearance: Normal appearance.  HENT:     Head: Normocephalic.  Cardiovascular:     Rate and Rhythm: Normal rate and regular rhythm.  Pulmonary:     Effort: Pulmonary effort is normal.     Breath sounds: Normal breath sounds.  Musculoskeletal:     Right lower leg: Edema present.     Left lower leg: Edema present.     Comments: 1+ pitting edema both legs.  Left leg no open blisters or sores. Not warm to palpation.   Neurological:     General: No focal deficit present.     Mental Status: He is alert and oriented to person, place, and time.  Psychiatric:        Mood and Affect: Mood normal.         Assessment & Plan:  Aaron AasAaron AasBransyn was seen today for medical management of chronic issues.  Diagnoses and all orders for this visit:  Chronic venous insufficiency -     hydrochlorothiazide  (HYDRODIURIL ) 12.5 MG tablet; Take 1 tablet (12.5 mg total) by mouth daily.  Left leg cellulitis   Infection improving, finish doxycycline .  Continue to keep legs compressed and elevated, discussed importance of this with keeping infections and  open wounds of the legs from forming Stay on hydrochlorothiazide  BP improved Keep regular scheduled follow ups   Return if symptoms worsen or fail to improve.    Celisa Schoenberg, PA-C

## 2023-11-01 ENCOUNTER — Other Ambulatory Visit: Payer: Self-pay | Admitting: *Deleted

## 2023-11-01 DIAGNOSIS — J4489 Other specified chronic obstructive pulmonary disease: Secondary | ICD-10-CM

## 2023-11-01 MED ORDER — TRELEGY ELLIPTA 100-62.5-25 MCG/ACT IN AEPB: 1.0000 | INHALATION_SPRAY | Freq: Every day | RESPIRATORY_TRACT | 11 refills | Status: AC

## 2023-11-09 ENCOUNTER — Telehealth: Payer: Self-pay

## 2023-11-09 ENCOUNTER — Other Ambulatory Visit (HOSPITAL_COMMUNITY): Payer: Self-pay

## 2023-11-09 NOTE — Telephone Encounter (Signed)
 Pharmacy Patient Advocate Encounter   Received notification from CoverMyMeds that prior authorization for SILDENAFIL  20MG  is required/requested.   Insurance verification completed.   The patient is insured through [employer] .    Drugs used for the treatment of erectile dysfunction (ED) are excluded from Part D coverage.

## 2023-11-13 NOTE — Telephone Encounter (Signed)
Unable to reach patient. Phone rings without answer

## 2023-11-16 NOTE — Telephone Encounter (Signed)
 Attempted call to patient. Phone rang without answer. Could not leave a voice mail message. Patient does  not have Mychart access.

## 2023-11-20 ENCOUNTER — Encounter: Payer: Self-pay | Admitting: Physician Assistant

## 2023-11-20 NOTE — Telephone Encounter (Signed)
Attempted call again to patient. Phone rang without answer. Could not leave a voice mail message.

## 2023-11-20 NOTE — Telephone Encounter (Signed)
 Letter placed in mail to patient

## 2023-12-13 ENCOUNTER — Ambulatory Visit: Admitting: Physician Assistant

## 2023-12-13 DIAGNOSIS — N1831 Chronic kidney disease, stage 3a: Secondary | ICD-10-CM

## 2023-12-13 DIAGNOSIS — L03116 Cellulitis of left lower limb: Secondary | ICD-10-CM

## 2023-12-13 DIAGNOSIS — I872 Venous insufficiency (chronic) (peripheral): Secondary | ICD-10-CM

## 2023-12-13 DIAGNOSIS — J4489 Other specified chronic obstructive pulmonary disease: Secondary | ICD-10-CM

## 2024-01-22 ENCOUNTER — Ambulatory Visit: Payer: Self-pay

## 2024-01-22 ENCOUNTER — Encounter: Payer: Self-pay | Admitting: Medical-Surgical

## 2024-01-22 ENCOUNTER — Ambulatory Visit (INDEPENDENT_AMBULATORY_CARE_PROVIDER_SITE_OTHER): Admitting: Medical-Surgical

## 2024-01-22 VITALS — BP 177/77 | HR 84 | Resp 20 | Ht 70.0 in | Wt 251.0 lb

## 2024-01-22 DIAGNOSIS — L03116 Cellulitis of left lower limb: Secondary | ICD-10-CM | POA: Diagnosis not present

## 2024-01-22 DIAGNOSIS — I10 Essential (primary) hypertension: Secondary | ICD-10-CM

## 2024-01-22 DIAGNOSIS — I872 Venous insufficiency (chronic) (peripheral): Secondary | ICD-10-CM | POA: Diagnosis not present

## 2024-01-22 MED ORDER — DOXYCYCLINE HYCLATE 100 MG PO TABS: 100.0000 mg | ORAL_TABLET | Freq: Two times a day (BID) | ORAL | 0 refills | Status: AC

## 2024-01-22 NOTE — Telephone Encounter (Signed)
 FYI Only or Action Required?: FYI only for provider.  Patient was last seen in primary care on 09/12/2023 by Antoniette Vermell CROME, PA-C.  Called Nurse Triage reporting Leg Swelling and Rash.  Symptoms began several months ago.  Interventions attempted: Other: elevation and guaze.  Symptoms are: gradually worsening.  Triage Disposition: See HCP Within 4 Hours (Or PCP Triage)  Patient/caregiver understands and will follow disposition?: Yes                             Copied from CRM [id number]. Topic: Clinical - Red Word Triage >> Jan 22, 2024 12:30 PM Laurier C wrote: Red Word that prompted transfer to Nurse Triage: Patient has some swelling in  his left leg, the swelling started almost 2 months ago. Patient also has some type of rash and isnt sure if its infected. Reason for Disposition  [1] Thigh, calf, or ankle swelling AND [2] only 1 side  Answer Assessment - Initial Assessment Questions 1. ONSET: When did the swelling start? (e.g., minutes, hours, days)     Several months ago, worsening, OV in May for symptoms 2. LOCATION: What part of the leg is swollen?  Are both legs swollen or just one leg?     Left leg 3. SEVERITY: How bad is the swelling? (e.g., localized; mild, moderate, severe)     Ankle to halfway up to knee 4. REDNESS: Is there redness or signs of infection?     Just a tad of redness, estimates area that is draining to be the size of a penny, slight drainage- states he cannot see drainage  5. PAIN: Is the swelling painful to touch? If Yes, ask: How painful is it?   (Scale 1-10; mild, moderate or severe)     States leg is painful, states pain comes and goes 6. FEVER: Do you have a fever? If Yes, ask: What is it, how was it measured, and when did it start?      Denies 7. CAUSE: What do you think is causing the leg swelling?     Unsure 8. MEDICAL HISTORY: Do you have a history of blood clots (e.g., DVT), cancer, heart  failure, kidney disease, or liver failure?     Denies history of blood clots 9. RECURRENT SYMPTOM: Have you had leg swelling before? If Yes, ask: When was the last time? What happened that time?     Yes, states he was seen a couple of months ago by his PCP for these symptoms 10. OTHER SYMPTOMS: Do you have any other symptoms? (e.g., chest pain, difficulty breathing)      States there is a portion of the leg that looks infection, SOB at baseline due to COPD, denies chest pain    Patient mentioned that his rescue/albuterol  is no longer helping his symptoms. Patient inquired about a portable oxygen  machine to help symptoms. Scheduled with alternate provider in office.  Protocols used: Leg Swelling and Edema-A-AH

## 2024-01-22 NOTE — Telephone Encounter (Signed)
 Patient scheduled.

## 2024-01-22 NOTE — Progress Notes (Signed)
        Established patient visit   History of Present Illness   Discussed the use of AI scribe software for clinical note transcription with the patient, who gave verbal consent to proceed.  History of Present Illness   Raymond Hardin is an 83 year old male who presents with a swollen and infected left lower leg.  Left ankle swelling and infection - Swelling and infection of the left ankle/lower leg for the past 1-2 months - Has a wound just above the lateral left ankle that has been draining - A friend applied a bandage to the draining wound - Today is the first day he has been able to put on his shoe - History of similar swelling in the past, sometimes extending down the leg - Recurrent ankle swelling for at least one year  Use of compression therapy - Not wearing compression socks regularly due to poor fit with shoes  Hypertension monitoring - Has high blood pressure - Admits to missing his medication doses today - Monitors blood pressure regularly     Physical Exam   Physical Exam Vitals and nursing note reviewed.  Constitutional:      General: He is not in acute distress.    Appearance: Normal appearance.  HENT:     Head: Normocephalic and atraumatic.  Cardiovascular:     Rate and Rhythm: Normal rate and regular rhythm.     Pulses: Normal pulses.     Heart sounds: Normal heart sounds. No murmur heard.    No friction rub. No gallop.  Pulmonary:     Effort: Pulmonary effort is normal. No respiratory distress.     Breath sounds: Normal breath sounds.  Musculoskeletal:     Left lower leg: Edema present.  Skin:    General: Skin is warm and dry.     Findings: Erythema (from the left ankle up to just below the knee, swelling present) and wound (left lower extremity just proximal to the ankle, purulent drainage) present.  Neurological:     Mental Status: He is alert and oriented to person, place, and time.  Psychiatric:        Mood and Affect: Mood normal.         Behavior: Behavior normal.        Thought Content: Thought content normal.        Judgment: Judgment normal.    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 Swelling due to venous insufficiency and inflammation. Outpatient management appropriate. - Prescribed Doxycycline  100mg  BID x 7 days.   - Clean left leg with warm soapy water, rinse, dry, apply Unna boot. - Instructed to keep leg wrapped, return for follow-up in one week. - Discussed use of compression socks and potential need for special shoes from [place].  Essential Hypertension Hypertension with elevated readings, possibly due to missed medication dose. Emphasized importance of monitoring both systolic and diastolic pressures. - Monitor blood pressure regularly, focus on both systolic and diastolic values. - Take medications as soon as possible.     Follow up   Return in about 1 week (around 01/29/2024) for left leg cellulitis in 1 week with PCP. __________________________________ Raymond FREDRIK Palin, DNP, APRN, FNP-BC Primary Care and Sports Medicine [facility]

## 2024-01-29 ENCOUNTER — Ambulatory Visit (INDEPENDENT_AMBULATORY_CARE_PROVIDER_SITE_OTHER): Admitting: Physician Assistant

## 2024-01-29 ENCOUNTER — Encounter: Payer: Self-pay | Admitting: Physician Assistant

## 2024-01-29 VITALS — BP 138/90 | HR 98 | Ht 70.0 in | Wt 241.0 lb

## 2024-01-29 DIAGNOSIS — I10 Essential (primary) hypertension: Secondary | ICD-10-CM | POA: Diagnosis not present

## 2024-01-29 DIAGNOSIS — I872 Venous insufficiency (chronic) (peripheral): Secondary | ICD-10-CM

## 2024-01-29 DIAGNOSIS — Z79899 Other long term (current) drug therapy: Secondary | ICD-10-CM

## 2024-01-29 DIAGNOSIS — L03116 Cellulitis of left lower limb: Secondary | ICD-10-CM | POA: Diagnosis not present

## 2024-01-29 DIAGNOSIS — B351 Tinea unguium: Secondary | ICD-10-CM | POA: Diagnosis not present

## 2024-01-29 DIAGNOSIS — Z7984 Long term (current) use of oral hypoglycemic drugs: Secondary | ICD-10-CM

## 2024-01-29 DIAGNOSIS — E1122 Type 2 diabetes mellitus with diabetic chronic kidney disease: Secondary | ICD-10-CM

## 2024-01-29 DIAGNOSIS — M25551 Pain in right hip: Secondary | ICD-10-CM | POA: Diagnosis not present

## 2024-01-29 DIAGNOSIS — N1831 Chronic kidney disease, stage 3a: Secondary | ICD-10-CM | POA: Diagnosis not present

## 2024-01-29 MED ORDER — TRAMADOL HCL 50 MG PO TABS: ORAL_TABLET | ORAL | 0 refills | Status: DC

## 2024-01-29 NOTE — Progress Notes (Unsigned)
 Established Patient Office Visit  Subjective   Patient ID: Raymond Hardin, male    DOB: [birth year]  Age: 83 y.o. MRN: [id number]  Chief Complaint  Patient presents with   Medical Management of Chronic Issues    HPI Discussed the use of AI scribe software for clinical note transcription with the patient, who gave verbal consent to proceed.  History of Present Illness Raymond Hardin is an 83 year old male with chronic venous insufficiency and type 2 diabetes who presents for follow-up on left lower leg cellulitis.  Left lower leg cellulitis - Last evaluated on January 22, 2024 for left lower leg cellulitis - Prescribed doxycycline  100 mg BID for seven days, but unable to obtain medication due to cost issues - Unaboot applied to left lower leg at last visit - Improvement in symptoms and appearance of the leg compared to last week  Lower extremity edema and chronic venous insufficiency - Chronic bilateral leg swelling - Skin of lower extremities with poor perfusion, predisposing to injury and infection - Elevates legs to manage swelling  Type 2 diabetes mellitus - No recent hemoglobin A1c obtained; will be added to upcoming labs  Chronic kidney disease stage 3a - No assessment of kidney function since last year  Hypertension - Initial elevated blood pressure at visit, improved on recheck  Chronic hip pain - Significant hip pain - Unable to use anti-inflammatory medications due to anticoagulant therapy     ROS See HPI.    Objective:     BP (!) 185/98 (BP Location: Right Arm, Patient Position: Sitting, Cuff Size: Large)   Pulse 98   Ht 5' 10 (1.778 m)   Wt 241 lb (109.3 kg)   SpO2 99%   BMI 34.58 kg/m  BP Readings from Last 3 Encounters:  01/29/24 (!) 185/98  01/22/24 (!) 177/77  09/12/23 138/64   Wt Readings from Last 3 Encounters:  01/29/24 241 lb (109.3 kg)  01/22/24 251 lb (113.9 kg)  09/12/23 247 lb (112 kg)      Physical  Exam Constitutional:      Appearance: He is obese.  HENT:     Head: Normocephalic.  Cardiovascular:     Rate and Rhythm: Normal rate and regular rhythm.  Pulmonary:     Effort: Pulmonary effort is normal.     Breath sounds: Normal breath sounds.  Musculoskeletal:     Right lower leg: Edema present.     Left lower leg: Edema present.     Comments: Bilateral leg edema with left being worse than right at 1+ up into calf. No warmth but redness over anterior lower left leg. Area of open skin where top layer of skin is pulled off. No drainage. No overt tenderness to palpation.   Bilateral toenails of both feet are thick, yellow, dystrophic.   Neurological:     General: No focal deficit present.     Mental Status: He is alert and oriented to person, place, and time.  Psychiatric:        Mood and Affect: Mood normal.        Assessment & Plan:  .  Assessment & Plan Cellulitis of left lower leg in the setting of chronic venous insufficiency Improvement noted without antibiotics. - Wrap leg with Unaboot for compression. - Reassess in one week.  Onychomycosis - both feet - topical failed in past - pt not interested in trying oral medication at this time  Chronic venous insufficiency of lower extremities Contributing to  leg swelling and cellulitis risk. Compression therapy essential. - Continue use of compression socks. - Wrap leg with Unaboot for compression.  Type 2 diabetes mellitus with chronic kidney disease stage 3A Kidney function not assessed since last year. - Order CMP to assess kidney function. - Order A1c to monitor blood sugar levels.  Essential hypertension Blood pressure decreased to 138/90 on recheck. - Continue current antihypertensive medication.  Chronic pain of left lower extremity Pain possibly related to venous insufficiency and cellulitis. Management challenging due to anticoagulant use. - Prescribe tramadol  1-2 tablets twice a day, quantity 20. -  Reassess pain control in one week.  Discussed need for flu shot but we do not have in stock at this time.      Chandler Stofer, PA-C

## 2024-01-30 ENCOUNTER — Ambulatory Visit: Payer: Self-pay | Admitting: Physician Assistant

## 2024-01-30 LAB — CMP14+EGFR
ALT: 8 IU/L (ref 0–44)
AST: 10 IU/L (ref 0–40)
Albumin: 4 g/dL (ref 3.7–4.7)
Alkaline Phosphatase: 52 IU/L (ref 48–129)
BUN/Creatinine Ratio: 14 (ref 10–24)
BUN: 20 mg/dL (ref 8–27)
Bilirubin Total: 0.3 mg/dL (ref 0.0–1.2)
CO2: 20 mmol/L (ref 20–29)
Calcium: 8.3 mg/dL — ABNORMAL LOW (ref 8.6–10.2)
Chloride: 106 mmol/L (ref 96–106)
Creatinine, Ser: 1.4 mg/dL — ABNORMAL HIGH (ref 0.76–1.27)
Globulin, Total: 2.4 g/dL (ref 1.5–4.5)
Glucose: 93 mg/dL (ref 70–99)
Potassium: 4.4 mmol/L (ref 3.5–5.2)
Sodium: 141 mmol/L (ref 134–144)
Total Protein: 6.4 g/dL (ref 6.0–8.5)
eGFR: 50 mL/min/1.73 — ABNORMAL LOW (ref >59)

## 2024-01-30 LAB — HEMOGLOBIN A1C
Est. average glucose Bld gHb Est-mCnc: 137 mg/dL
Hgb A1c MFr Bld: 6.4 % — ABNORMAL HIGH (ref 4.8–5.6)

## 2024-01-30 NOTE — Progress Notes (Signed)
 Oda,   A1C is under 7 at 6.4 and to goal range.  Continue same diabetic medications.  Chronic kidney disease stable. Make sure to stay hydrated during the day. On protective medications. Keep BP under 130/80.

## 2024-02-05 ENCOUNTER — Ambulatory Visit: Admitting: Physician Assistant

## 2024-02-05 ENCOUNTER — Encounter: Payer: Self-pay | Admitting: Physician Assistant

## 2024-02-05 VITALS — BP 135/72 | HR 77

## 2024-02-05 DIAGNOSIS — Z23 Encounter for immunization: Secondary | ICD-10-CM

## 2024-02-05 DIAGNOSIS — J4489 Other specified chronic obstructive pulmonary disease: Secondary | ICD-10-CM | POA: Diagnosis not present

## 2024-02-05 DIAGNOSIS — L03116 Cellulitis of left lower limb: Secondary | ICD-10-CM

## 2024-02-05 DIAGNOSIS — I872 Venous insufficiency (chronic) (peripheral): Secondary | ICD-10-CM | POA: Diagnosis not present

## 2024-02-05 MED ORDER — BREZTRI AEROSPHERE 160-9-4.8 MCG/ACT IN AERO: 2.0000 | INHALATION_SPRAY | Freq: Two times a day (BID) | RESPIRATORY_TRACT | 5 refills | Status: DC

## 2024-02-05 NOTE — Telephone Encounter (Signed)
 Spoke with the pt and the inhaler he is referring to is his trelegy. States he can't tell any difference when using it and would like to know if there is something else that he can take that could help. Pt is coming in to see Vermell Gaunt, PA-C today 02/05/24

## 2024-02-05 NOTE — Progress Notes (Unsigned)
   Established Patient Office Visit  Subjective   Patient ID: Raymond Hardin, male    DOB: 1940/05/17  Age: 83 y.o. MRN: [id number]  Chief Complaint  Patient presents with   Medical Management of Chronic Issues    HPI Pt is here for 1 week follow up on left leg swelling and infection. He has kept his unna boot on.    ROS See HPI.    Objective:     BP 135/72 (Cuff Size: Normal)   Pulse 77  BP Readings from Last 3 Encounters:  02/07/24 135/72  01/29/24 (!) 138/90  01/22/24 (!) 177/77   Wt Readings from Last 3 Encounters:  01/29/24 241 lb (109.3 kg)  01/22/24 251 lb (113.9 kg)  09/12/23 247 lb (112 kg)      Physical Exam Musculoskeletal:     Comments: Scant edema after unna boot removed with no evidence of infection  Neurological:     Mental Status: He is alert and oriented to person, place, and time.  Psychiatric:        Mood and Affect: Mood normal.        Assessment & Plan:  Raymond Hardin was seen today for medical management of chronic issues.  Diagnoses and all orders for this visit:  Chronic venous insufficiency  COPD (chronic obstructive pulmonary disease) with chronic bronchitis (HCC) -     budesonide-glycopyrrolate-formoterol (BREZTRI AEROSPHERE) 160-9-4.8 MCG/ACT AERO inhaler; Inhale 2 puffs into the lungs 2 (two) times daily.  Left leg cellulitis  Need for influenza vaccination -     Flu vaccine HIGH DOSE PF(Fluzone Trivalent)   Resolved cellulitis Swelling much better in left leg Encouraged to continue to keep compressed and elevated to help with swelling and to prevent sores and infection Pt reports trelegy not working well, switched to [employer] Flu shot given today    Return in about 3 months (around 05/06/2024).    Mack Thurmon, PA-C

## 2024-02-05 NOTE — Patient Instructions (Addendum)
 Breztri to replace trelegy.  Continue to keep legs wrapped in compression stockings and keep elevated.

## 2024-02-07 ENCOUNTER — Encounter: Payer: Self-pay | Admitting: Physician Assistant

## 2024-02-07 NOTE — Telephone Encounter (Signed)
 Copied from CRM #[id number]. Topic: Clinical - Prescription Issue >> Feb 07, 2024  2:28 PM DeAngela L wrote:  Reason for CRM: [facility] pharmacy calling about the Breztri medication is $1600 and his insurance or medicare doesn't this and the pharmacist would like to ask if the provider would like to refill TRELEGY  the patient has refills and she understands the provider might have wanted to try something different but the cost is a lot.Zelda the pharmacist at [id number] - [place], [place] - [address] AT [facility] [address] [place] [address] Phone: [phone] Fax: [phone]

## 2024-02-14 ENCOUNTER — Ambulatory Visit: Payer: Self-pay

## 2024-02-14 NOTE — Telephone Encounter (Signed)
 FYI Only or Action Required?: Action required by provider: medication refill request.  Patient was last seen in primary care on 02/05/2024 by Raymond Vermell CROME, PA-C.  Called Nurse Triage reporting Leg Swelling.  Symptoms began today.  Interventions attempted: Nothing.  Symptoms are: unchanged.  Triage Disposition: See Physician Within 24 Hours  Patient/caregiver understands and will follow disposition?: No, wishes to speak with PCP      Copied from CRM #[id number]. Topic: Clinical - Red Word Triage >> Feb 14, 2024  3:59 PM Ivette P wrote: [facility] that prompted transfer to Nurse Triage: p0t has no medication and leg is swelling   Hydrochlorothiazide  (HYDRODIURIL ) 12.5 MG tablet       Reason for Disposition  [1] MODERATE leg swelling (e.g., swelling extends up to knees) AND [2] new-onset or getting worse  Answer Assessment - Initial Assessment Questions Patient declined an appointment stating that he just wants to get his Hydrochlorothiazide  refilled. Please advise.       1. ONSET: When did the swelling start? (e.g., minutes, hours, days)     Today  2. LOCATION: What part of the leg is swollen?  Are both legs swollen or just one leg?     Left leg  3. SEVERITY: How bad is the swelling? (e.g., localized; mild, moderate, severe)     From knee down  4. REDNESS: Is there redness or signs of infection?     No 5. PAIN: Is the swelling painful to touch? If Yes, ask: How painful is it?   (Scale 1-10; mild, moderate or severe)     No 6. FEVER: Do you have a fever? If Yes, ask: What is it, how was it measured, and when did it start?      No 7. CAUSE: What do you think is causing the leg swelling?     Out of his Hydrochlorothiazide   8. MEDICAL HISTORY: Do you have a history of blood clots (e.g., DVT), cancer, heart failure, kidney disease, or liver failure?    Chronic venous insufficiency  9. RECURRENT SYMPTOM: Have you had leg swelling before? If Yes,  ask: When was the last time? What happened that time?     Yes, takes Hydrochlorothiazide   10. OTHER SYMPTOMS: Do you have any other symptoms? (e.g., chest pain, difficulty breathing)       No  Protocols used: Leg Swelling and Edema-A-AH

## 2024-02-15 ENCOUNTER — Other Ambulatory Visit: Payer: Self-pay

## 2024-02-15 DIAGNOSIS — I872 Venous insufficiency (chronic) (peripheral): Secondary | ICD-10-CM

## 2024-02-15 MED ORDER — HYDROCHLOROTHIAZIDE 12.5 MG PO TABS: 12.5000 mg | ORAL_TABLET | Freq: Every day | ORAL | 1 refills | Status: AC

## 2024-02-21 ENCOUNTER — Ambulatory Visit: Admitting: Physician Assistant

## 2024-02-21 VITALS — BP 140/78 | HR 110 | Ht 70.0 in | Wt 241.0 lb

## 2024-02-21 DIAGNOSIS — I872 Venous insufficiency (chronic) (peripheral): Secondary | ICD-10-CM | POA: Diagnosis not present

## 2024-02-21 DIAGNOSIS — B9689 Other specified bacterial agents as the cause of diseases classified elsewhere: Secondary | ICD-10-CM | POA: Diagnosis not present

## 2024-02-21 DIAGNOSIS — R600 Localized edema: Secondary | ICD-10-CM

## 2024-02-21 DIAGNOSIS — L089 Local infection of the skin and subcutaneous tissue, unspecified: Secondary | ICD-10-CM | POA: Diagnosis not present

## 2024-02-21 NOTE — Patient Instructions (Signed)
 COMPRESSION and elevation.

## 2024-02-26 ENCOUNTER — Encounter: Payer: Self-pay | Admitting: Physician Assistant

## 2024-02-26 DIAGNOSIS — L089 Local infection of the skin and subcutaneous tissue, unspecified: Secondary | ICD-10-CM | POA: Insufficient documentation

## 2024-02-26 NOTE — Progress Notes (Signed)
   Acute Office Visit  Subjective:     Patient ID: Raymond Hardin, male    DOB: 05-18-1940, 83 y.o.   MRN: [id number]  Chief Complaint  Patient presents with   Medical Management of Chronic Issues    HPI Pt is a 83 yo male with chronic venous insuffiencey and recurrent left leg infections and swelling more than right who presents to the clinic with left leg symptoms. He has been wearing high socks but not compression socks. He is not keeping his legs elevated. He denies any fever, chills. He noticed a sore and some weeping of left lower leg and wanted it checked out. It is hard for him to clean his legs.   ROS See HPI.      Objective:    BP (!) 165/80   Pulse (!) 110   Ht 5' 10 (1.778 m)   Wt 241 lb (109.3 kg)   SpO2 99%   BMI 34.58 kg/m  BP Readings from Last 3 Encounters:  02/21/24 (!) 165/80  02/07/24 135/72  01/29/24 (!) 138/90   Wt Readings from Last 3 Encounters:  02/21/24 241 lb (109.3 kg)  01/29/24 241 lb (109.3 kg)  01/22/24 251 lb (113.9 kg)      Physical Exam Left lower leg: slightly more erythematous and swollen than right 1.5+ pitting edema with some lateral sided weeping and small superficial abrasion and light bleeding.       Assessment & Plan:  [name] was seen today for medical management of chronic issues.  Diagnoses and all orders for this visit:  Leg edema, left  Chronic venous insufficiency  Superficial bacterial skin infection   I do not think cellulitis but rather superficial infection that can be treated with unna boot, compression, and elevation.  Wear for 5 days then RTC Discussed what compression stockings are and how to get them Follow up sooner with any worsening pain, fever, chills.   Return in about 5 days (around 02/26/2024).  [name], PA-C

## 2024-02-27 ENCOUNTER — Encounter: Payer: Self-pay | Admitting: Physician Assistant

## 2024-02-27 ENCOUNTER — Ambulatory Visit (INDEPENDENT_AMBULATORY_CARE_PROVIDER_SITE_OTHER): Admitting: Physician Assistant

## 2024-02-27 VITALS — BP 142/82 | HR 112 | Ht 70.0 in | Wt 241.0 lb

## 2024-02-27 DIAGNOSIS — I480 Paroxysmal atrial fibrillation: Secondary | ICD-10-CM | POA: Diagnosis not present

## 2024-02-27 DIAGNOSIS — E785 Hyperlipidemia, unspecified: Secondary | ICD-10-CM

## 2024-02-27 DIAGNOSIS — E559 Vitamin D deficiency, unspecified: Secondary | ICD-10-CM | POA: Diagnosis not present

## 2024-02-27 DIAGNOSIS — I10 Essential (primary) hypertension: Secondary | ICD-10-CM

## 2024-02-27 DIAGNOSIS — R0989 Other specified symptoms and signs involving the circulatory and respiratory systems: Secondary | ICD-10-CM | POA: Diagnosis not present

## 2024-02-27 DIAGNOSIS — J4489 Other specified chronic obstructive pulmonary disease: Secondary | ICD-10-CM

## 2024-02-27 DIAGNOSIS — F3341 Major depressive disorder, recurrent, in partial remission: Secondary | ICD-10-CM

## 2024-02-27 DIAGNOSIS — Z7984 Long term (current) use of oral hypoglycemic drugs: Secondary | ICD-10-CM | POA: Diagnosis not present

## 2024-02-27 DIAGNOSIS — L089 Local infection of the skin and subcutaneous tissue, unspecified: Secondary | ICD-10-CM

## 2024-02-27 DIAGNOSIS — N1831 Chronic kidney disease, stage 3a: Secondary | ICD-10-CM | POA: Diagnosis not present

## 2024-02-27 DIAGNOSIS — E1122 Type 2 diabetes mellitus with diabetic chronic kidney disease: Secondary | ICD-10-CM | POA: Diagnosis not present

## 2024-02-27 DIAGNOSIS — E1169 Type 2 diabetes mellitus with other specified complication: Secondary | ICD-10-CM

## 2024-02-27 DIAGNOSIS — I872 Venous insufficiency (chronic) (peripheral): Secondary | ICD-10-CM

## 2024-02-27 MED ORDER — DAPAGLIFLOZIN PROPANEDIOL 10 MG PO TABS: 10.0000 mg | ORAL_TABLET | Freq: Every day | ORAL | 1 refills | Status: AC

## 2024-02-27 MED ORDER — VALSARTAN 320 MG PO TABS: 320.0000 mg | ORAL_TABLET | Freq: Every day | ORAL | 1 refills | Status: AC

## 2024-02-27 MED ORDER — VORTIOXETINE HBR 20 MG PO TABS: 20.0000 mg | ORAL_TABLET | Freq: Every day | ORAL | 1 refills | Status: AC

## 2024-02-27 MED ORDER — XARELTO 20 MG PO TABS: 20.0000 mg | ORAL_TABLET | Freq: Every day | ORAL | 3 refills | Status: AC

## 2024-02-27 MED ORDER — ALBUTEROL SULFATE HFA 108 (90 BASE) MCG/ACT IN AERS: 2.0000 | INHALATION_SPRAY | Freq: Four times a day (QID) | RESPIRATORY_TRACT | 1 refills | Status: DC | PRN

## 2024-02-27 MED ORDER — VITAMIN D3 25 MCG (1000 UT) PO CAPS: 1000.0000 [IU] | ORAL_CAPSULE | Freq: Every day | ORAL | 3 refills | Status: AC

## 2024-02-27 MED ORDER — ARIPIPRAZOLE 2 MG PO TABS: ORAL_TABLET | ORAL | 1 refills | Status: AC

## 2024-02-27 MED ORDER — AMLODIPINE BESYLATE 2.5 MG PO TABS: 2.5000 mg | ORAL_TABLET | Freq: Every day | ORAL | 0 refills | Status: DC

## 2024-02-27 NOTE — Patient Instructions (Addendum)
 ABI ordered to evaluate for PAD due to high risk.

## 2024-02-27 NOTE — Progress Notes (Deleted)
 Error

## 2024-02-27 NOTE — Progress Notes (Signed)
 Established Patient Office Visit  Subjective   Patient ID: Raymond Hardin, male    DOB: 05-May-1941  Age: 83 y.o. MRN: [id number]  Chief Complaint  Patient presents with   Medical Management of Chronic Issues    HPI .SABRADiscussed the use of AI scribe software for clinical note transcription with the patient, who gave verbal consent to proceed.  History of Present Illness Raymond Hardin is an 83 year old male who presents with swelling and itching in the lower extremity.  Lower extremity edema and discoloration - Swelling primarily located around the ankle of the lower extremity - Skin in the affected area appears dark - Concern for poor circulation in the leg - Difficulty palpating pulse in the affected area - Concern about potential need for referral if blood flow not intact  Pruritus - Itching present in the area where swelling is subsiding  Self-care measures - Elevating the leg and using a wrap since last Wednesday  Antihypertensive medication tolerance - Takes two blood pressure medications, described as large pills that sometimes get stuck in his throat  Sleep disturbance and blood pressure - Blood pressure elevated, possibly related to poor sleep - Sleep interrupted: went to bed at 11 PM, woke at 2 AM and again at 4 AM    ROS See HPI.    Objective:     BP (!) 142/82   Pulse (!) 112   Ht 5' 10 (1.778 m)   Wt 241 lb (109.3 kg)   SpO2 99%   BMI 34.58 kg/m  BP Readings from Last 3 Encounters:  02/27/24 (!) 142/82  02/21/24 (!) 140/78  02/07/24 135/72   Wt Readings from Last 3 Encounters:  02/27/24 241 lb (109.3 kg)  02/21/24 241 lb (109.3 kg)  01/29/24 241 lb (109.3 kg)    .    01/22/2024    3:41 PM 08/31/2023    2:01 PM 06/14/2023    9:59 AM 08/22/2022    2:10 PM 04/27/2022   10:45 AM  Depression screen PHQ 2/9  Decreased Interest 1 0 0 0 0  Down, Depressed, Hopeless 1 1 0 0 0  PHQ - 2 Score 2 1 0 0 0  Altered sleeping 1  0    Tired,  decreased energy 1  0    Change in appetite 1  0    Feeling bad or failure about yourself  1  0    Trouble concentrating 1  0    Moving slowly or fidgety/restless 1  0    Suicidal thoughts 1  0    PHQ-9 Score 9  0    Difficult doing work/chores Somewhat difficult  Not difficult at all     .SABRA    01/22/2024    3:41 PM 08/24/2021    3:22 PM 07/19/2021    2:08 PM 07/06/2020   10:56 AM  GAD 7 : Generalized Anxiety Score  Nervous, Anxious, on Edge 1 0 2 0  Control/stop worrying 1 1 2 2   Worry too much - different things 1 1 2 2   Trouble relaxing 1 0 2 0  Restless 0 0 2 0  Easily annoyed or irritable 1 0 2 0  Afraid - awful might happen 1 0 0 0  Total GAD 7 Score 6 2 12 4   Anxiety Difficulty Somewhat difficult Not difficult at all Somewhat difficult Not difficult at all      Physical Exam HENT:     Head: Normocephalic.  Cardiovascular:  Rate and Rhythm: Normal rate and regular rhythm.  Pulmonary:     Effort: Pulmonary effort is normal.     Breath sounds: Normal breath sounds.  Musculoskeletal:     Right lower leg: Edema present.     Left lower leg: Edema present.     Comments: Improving edema of left leg due to compression wrap with healing superficial sore approximately 2cm by 1.5cm of left lateral leg. No warmth or tenderness. Hyperpigmentation over bilateral lower legs left worse than right.   Scant edema of right lower leg  Not able to palpate pulse in left foot.   Neurological:     General: No focal deficit present.     Mental Status: He is alert and oriented to person, place, and time.  Psychiatric:        Mood and Affect: Mood normal.         Assessment & Plan:  .Dennise was seen today for medical management of chronic issues.  Diagnoses and all orders for this visit:  Chronic venous insufficiency  COPD (chronic obstructive pulmonary disease) with chronic bronchitis (HCC) -     albuterol  (VENTOLIN  HFA) 108 (90 Base) MCG/ACT inhaler; Inhale 2 puffs into  the lungs every 6 (six) hours as needed.  Type 2 diabetes mellitus with stage 3a chronic kidney disease, without long-term current use of insulin (HCC) -     dapagliflozin  propanediol (FARXIGA ) 10 MG TABS tablet; Take 1 tablet (10 mg total) by mouth daily.  Recurrent major depressive disorder, in partial remission -     vortioxetine  HBr (TRINTELLIX ) 20 MG TABS tablet; Take 1 tablet (20 mg total) by mouth daily. -     ARIPiprazole  (ABILIFY ) 2 MG tablet; Take one tablet daily.  PAF (paroxysmal atrial fibrillation) (HCC) -     XARELTO  20 MG TABS tablet; Take 1 tablet (20 mg total) by mouth daily.  Essential hypertension, benign -     valsartan  (DIOVAN ) 320 MG tablet; Take 1 tablet (320 mg total) by mouth daily. For blood pressure. -     amLODipine  (NORVASC ) 2.5 MG tablet; Take 1 tablet (2.5 mg total) by mouth daily. For blood pressure.  Local skin infection  Vitamin D  deficiency -     Cholecalciferol (VITAMIN D3) 25 MCG (1000 UT) CAPS; Take 1 capsule (1,000 Units total) by mouth daily.   Assessment & Plan Chronic venous insufficiency with left leg edema and superficial bacterial skin infection Swelling reducing, itching due to healing. Persistent swelling and discoloration indicate circulation issues. Pulse difficult to palpate due to thick skin. Discussed potential stenting if blockage identified. - Rewrap leg for another week with unna boot.  - ABI testing ordered due to not being able to palpate pedal pulses.  Hypertension Recent elevated blood pressure. Poor sleep may have contributed. Emphasized medication adherence. - Refilled norvasc  and diovan . - Ensure adherence to blood pressure medication regimen.  Depression/Anxiety PHQ/GAD to goal - Refilled Trintellix  and abilify    PAF In rhythm today - Refilled Xarelto   COPD - Refilled albuterol       Return in about 1 week (around 03/05/2024).    Derrick Orris, PA-C

## 2024-03-06 ENCOUNTER — Encounter: Payer: Self-pay | Admitting: Physician Assistant

## 2024-03-06 ENCOUNTER — Ambulatory Visit (INDEPENDENT_AMBULATORY_CARE_PROVIDER_SITE_OTHER): Admitting: Physician Assistant

## 2024-03-06 VITALS — BP 132/80 | HR 107 | Ht 70.0 in | Wt 246.0 lb

## 2024-03-06 DIAGNOSIS — L089 Local infection of the skin and subcutaneous tissue, unspecified: Secondary | ICD-10-CM

## 2024-03-06 DIAGNOSIS — I872 Venous insufficiency (chronic) (peripheral): Secondary | ICD-10-CM | POA: Diagnosis not present

## 2024-03-06 NOTE — Patient Instructions (Addendum)
 Keep COMPRESSION SOCKS on!! Keep feet ELEVATED.

## 2024-03-11 ENCOUNTER — Encounter: Payer: Self-pay | Admitting: Physician Assistant

## 2024-03-11 NOTE — Progress Notes (Signed)
   Established Patient Office Visit  Subjective   Patient ID: Raymond Hardin, male    DOB: Jan 18, 1941  Age: 83 y.o. MRN: [id number]  Chief Complaint  Patient presents with   Medical Management of Chronic Issues    Unna boot check     HPI .SABRADiscussed the use of AI scribe software for clinical note transcription with the patient, who gave verbal consent to proceed.  History of Present Illness An 83 year old male presents with lower extremity swelling and skin integrity issues.  Lower extremity edema - Persistent swelling of the lower extremities - Requires compression to prevent skin breakdown - Swelling increases risk of skin breakdown, particularly with trauma or friction  Skin integrity impairment - Swelling can cause skin to break open, especially if rubbed or hit against objects - Difficulty with wound healing when skin is broken  Compression therapy - Currently using compression stockings - Has several pairs of compression stockings - Compression stockings are not very tight    ROS See HPI.    Objective:     BP 132/80   Pulse (!) 107   Ht 5' 10 (1.778 m)   Wt 246 lb (111.6 kg)   SpO2 93%   BMI 35.30 kg/m  BP Readings from Last 3 Encounters:  03/06/24 132/80  02/27/24 (!) 142/82  02/21/24 (!) 140/78   Wt Readings from Last 3 Encounters:  03/06/24 246 lb (111.6 kg)  02/27/24 241 lb (109.3 kg)  02/21/24 241 lb (109.3 kg)      Physical Exam Constitutional:      Appearance: Normal appearance. He is obese.  Cardiovascular:     Rate and Rhythm: Normal rate.  Pulmonary:     Effort: Pulmonary effort is normal.  Musculoskeletal:     Right lower leg: Edema present.     Left lower leg: Edema present.     Comments: Edema improved in left leg after unna boot taken off. Skin intact and no signs of infection. Chronic hyperpigmentation of left lower extremity.   Neurological:     General: No focal deficit present.     Mental Status: He is alert and oriented  to person, place, and time.  Psychiatric:        Mood and Affect: Mood normal.       Assessment & Plan:  SABRASABRAReuel was seen today for medical management of chronic issues.  Diagnoses and all orders for this visit:  Chronic venous insufficiency   Assessment & Plan Chronic venous insufficiency with local skin and subcutaneous tissue infection - Ensure use of adequately tight compression stockings. - Elevate legs to reduce swelling. - Maintain a low-salt diet to prevent fluid retention. - Avoid applying saline to the area today. - Consider using lotion for skin care if needed.    Return if symptoms worsen or fail to improve.    Jannifer Fischler, PA-C

## 2024-03-27 ENCOUNTER — Other Ambulatory Visit: Payer: Self-pay | Admitting: Physician Assistant

## 2024-03-27 DIAGNOSIS — R0989 Other specified symptoms and signs involving the circulatory and respiratory systems: Secondary | ICD-10-CM

## 2024-03-27 DIAGNOSIS — J4489 Other specified chronic obstructive pulmonary disease: Secondary | ICD-10-CM

## 2024-03-27 DIAGNOSIS — N529 Male erectile dysfunction, unspecified: Secondary | ICD-10-CM

## 2024-03-27 DIAGNOSIS — I872 Venous insufficiency (chronic) (peripheral): Secondary | ICD-10-CM

## 2024-03-27 DIAGNOSIS — F3341 Major depressive disorder, recurrent, in partial remission: Secondary | ICD-10-CM

## 2024-03-27 DIAGNOSIS — I480 Paroxysmal atrial fibrillation: Secondary | ICD-10-CM

## 2024-03-27 DIAGNOSIS — E559 Vitamin D deficiency, unspecified: Secondary | ICD-10-CM

## 2024-03-27 DIAGNOSIS — E1169 Type 2 diabetes mellitus with other specified complication: Secondary | ICD-10-CM

## 2024-03-27 DIAGNOSIS — I10 Essential (primary) hypertension: Secondary | ICD-10-CM

## 2024-03-27 DIAGNOSIS — L089 Local infection of the skin and subcutaneous tissue, unspecified: Secondary | ICD-10-CM

## 2024-03-27 DIAGNOSIS — N1831 Chronic kidney disease, stage 3a: Secondary | ICD-10-CM

## 2024-03-28 ENCOUNTER — Other Ambulatory Visit (HOSPITAL_COMMUNITY): Payer: Self-pay

## 2024-03-28 ENCOUNTER — Telehealth: Payer: Self-pay

## 2024-03-28 NOTE — Telephone Encounter (Signed)
 Pharmacy Patient Advocate Encounter   Received notification from CoverMyMeds that prior authorization for Sildenafil  20mg  tabs is required/requested.   Insurance verification completed.   The patient is insured through [employer].   Per test claim: Sildenafil  is not covered under Medicare Part D for erectile dysfunction.   PA not submitted.

## 2024-03-29 NOTE — Telephone Encounter (Signed)
 Patient informed  of denial and the cost of using good rx to fill the prescription . He will use this.

## 2024-04-03 ENCOUNTER — Other Ambulatory Visit (HOSPITAL_COMMUNITY): Payer: Self-pay

## 2024-04-03 ENCOUNTER — Telehealth: Payer: Self-pay

## 2024-04-03 NOTE — Telephone Encounter (Signed)
 Pharmacy Patient Advocate Encounter   Received notification from CoverMyMeds that prior authorization for Sildenafil  20mg  tabs is required/requested.   Insurance verification completed.   The patient is insured through [employer].   Per test claim: Per test claim, medication is not covered due to plan/benefit exclusion, PA not submitted at this time  Sildenafil  is not covered by Medicare Part D for ED.

## 2024-04-08 ENCOUNTER — Ambulatory Visit (HOSPITAL_BASED_OUTPATIENT_CLINIC_OR_DEPARTMENT_OTHER): Attending: Physician Assistant

## 2024-04-08 NOTE — Telephone Encounter (Signed)
 Not covered through insurance.

## 2024-04-08 NOTE — Telephone Encounter (Signed)
 Patient advised.

## 2024-04-16 ENCOUNTER — Other Ambulatory Visit: Payer: Self-pay | Admitting: Physician Assistant

## 2024-04-16 NOTE — Telephone Encounter (Unsigned)
 Copied from CRM #[id number]. Topic: Clinical - Medication Refill >> Apr 16, 2024  2:45 PM Joesph B wrote: Medication: traMADol  (ULTRAM ) 50 MG tablet [[id number]]  Has the patient contacted their pharmacy? Yes (Agent: If no, request that the patient contact the pharmacy for the refill. If patient does not wish to contact the pharmacy document the reason why and proceed with request.) (Agent: If yes, when and what did the pharmacy advise?)  This is the patient's preferred pharmacy:  [id number] - [place], [place] - [address] [facility] [address] [place] [address] Phone: [phone] Fax: [phone]    Is this the correct pharmacy for this prescription? Yes If no, delete pharmacy and type the correct one.   Has the prescription been filled recently? Yes  Is the patient out of the medication? Yes  Has the patient been seen for an appointment in the last year OR does the patient have an upcoming appointment? Yes  Can we respond through MyChart? No  Agent: Please be advised that Rx refills may take up to 3 business days. We ask that you follow-up with your pharmacy.

## 2024-04-17 MED ORDER — TRAMADOL HCL 50 MG PO TABS: ORAL_TABLET | ORAL | 0 refills | Status: DC

## 2024-05-06 ENCOUNTER — Ambulatory Visit: Admitting: Physician Assistant

## 2024-05-10 ENCOUNTER — Encounter: Payer: Self-pay | Admitting: Physician Assistant

## 2024-05-10 ENCOUNTER — Ambulatory Visit (INDEPENDENT_AMBULATORY_CARE_PROVIDER_SITE_OTHER): Admitting: Physician Assistant

## 2024-05-10 VITALS — BP 180/100 | HR 107 | Ht 70.0 in | Wt 252.0 lb

## 2024-05-10 DIAGNOSIS — E1122 Type 2 diabetes mellitus with diabetic chronic kidney disease: Secondary | ICD-10-CM | POA: Diagnosis not present

## 2024-05-10 DIAGNOSIS — I480 Paroxysmal atrial fibrillation: Secondary | ICD-10-CM | POA: Diagnosis not present

## 2024-05-10 DIAGNOSIS — I872 Venous insufficiency (chronic) (peripheral): Secondary | ICD-10-CM | POA: Diagnosis not present

## 2024-05-10 DIAGNOSIS — M2550 Pain in unspecified joint: Secondary | ICD-10-CM | POA: Diagnosis not present

## 2024-05-10 DIAGNOSIS — I10 Essential (primary) hypertension: Secondary | ICD-10-CM

## 2024-05-10 DIAGNOSIS — G8929 Other chronic pain: Secondary | ICD-10-CM | POA: Diagnosis not present

## 2024-05-10 DIAGNOSIS — F3341 Major depressive disorder, recurrent, in partial remission: Secondary | ICD-10-CM | POA: Diagnosis not present

## 2024-05-10 DIAGNOSIS — N1831 Chronic kidney disease, stage 3a: Secondary | ICD-10-CM | POA: Diagnosis not present

## 2024-05-10 DIAGNOSIS — M545 Low back pain, unspecified: Secondary | ICD-10-CM

## 2024-05-10 LAB — POCT GLYCOSYLATED HEMOGLOBIN (HGB A1C): Hemoglobin A1C: 6.5 % — AB (ref 4.0–5.6)

## 2024-05-10 MED ORDER — LANCET DEVICE MISC: 1.0000 | Freq: Three times a day (TID) | 0 refills | Status: AC

## 2024-05-10 MED ORDER — LANCETS MISC: 1.0000 | 0 refills | Status: AC

## 2024-05-10 MED ORDER — BLOOD GLUCOSE MONITORING SUPPL DEVI: 1.0000 | Freq: Three times a day (TID) | 0 refills | Status: AC

## 2024-05-10 MED ORDER — BLOOD GLUCOSE TEST VI STRP: 1.0000 | ORAL_STRIP | Freq: Three times a day (TID) | 0 refills | Status: AC

## 2024-05-10 MED ORDER — TRAMADOL HCL 50 MG PO TABS: ORAL_TABLET | ORAL | 0 refills | Status: AC

## 2024-05-10 MED ORDER — AMLODIPINE BESYLATE 2.5 MG PO TABS: 2.5000 mg | ORAL_TABLET | Freq: Every day | ORAL | 0 refills | Status: AC

## 2024-05-13 ENCOUNTER — Encounter: Payer: Self-pay | Admitting: Physician Assistant

## 2024-05-13 DIAGNOSIS — M545 Low back pain, unspecified: Secondary | ICD-10-CM | POA: Insufficient documentation

## 2024-05-13 DIAGNOSIS — M2550 Pain in unspecified joint: Secondary | ICD-10-CM | POA: Insufficient documentation

## 2024-05-13 NOTE — Progress Notes (Signed)
 "  Established Patient Office Visit  Subjective   Patient ID: Raymond Hardin, male    DOB: 05-12-1940  Age: 84 y.o. MRN: [id number]  Chief Complaint  Patient presents with   Medical Management of Chronic Issues    Back pain,been feeling off balanced     HPI .SABRADiscussed the use of AI scribe software for clinical note transcription with the patient, who gave verbal consent to proceed.  History of Present Illness DEAVEON SCHOEN is an 84 year old male with hypertension and diabetes who presents for medication management and follow-up.  Hypertension management - Difficulty remembering to take blood pressure medications, including hydrochlorothiazide  and possibly bosentan, at night and in the morning - Uses a medication case for organization but often forgets to use it - No chest pain  PAF - No recent episodes.  - Denies any palpitations, chest pain or tightness  Diabetes mellitus management - Blood sugar levels stable at 6.5, but has not checked recently at home - Uses Accu-Chek device for glucose monitoring but he has lost his meter - Currently taking metformin  and Farxiga   Gait instability and balance issues - Experiences balance difficulties, attributed to inadequate footwear and sometimes walking without shoes - No recent falls - Uses a cane for support - Emergency alert device incident occurred when it fell, triggering a response team - Needs to pick up custom shoes from a company in [place], [place]  Tobacco use - Continues to smoke with no changes in smoking habits  Psychosocial stressors - Estranged relationship with son, not seen in five years - Emotional impact from family estrangement    ROS See HPI.    Objective:     BP (!) 180/100   Pulse (!) 107   Ht 5' 10 (1.778 m)   Wt 252 lb (114.3 kg)   SpO2 99%   BMI 36.16 kg/m  BP Readings from Last 3 Encounters:  05/10/24 (!) 180/100  03/06/24 132/80  02/27/24 (!) 142/82   Wt Readings from Last 3  Encounters:  05/10/24 252 lb (114.3 kg)  03/06/24 246 lb (111.6 kg)  02/27/24 241 lb (109.3 kg)      Physical Exam Constitutional:      Appearance: Normal appearance. He is obese.  HENT:     Head: Normocephalic.  Cardiovascular:     Rate and Rhythm: Normal rate and regular rhythm.  Pulmonary:     Effort: Pulmonary effort is normal.     Breath sounds: Normal breath sounds.  Musculoskeletal:     Cervical back: Normal range of motion and neck supple.     Right lower leg: Edema present.     Left lower leg: Edema present.     Comments: Improving with scant bilateral edema.   Neurological:     General: No focal deficit present.     Mental Status: He is alert and oriented to person, place, and time.  Psychiatric:        Mood and Affect: Mood normal.      Results for orders placed or performed in visit on 05/10/24  POCT HgB A1C  Result Value Ref Range   Hemoglobin A1C 6.5 (A) 4.0 - 5.6 %   HbA1c POC (<> result, manual entry)     HbA1c, POC (prediabetic range)     HbA1c, POC (controlled diabetic range)        Assessment & Plan:  .Aser was seen today for medical management of chronic issues.  Diagnoses and all orders for this  visit:  Type 2 diabetes mellitus with stage 3a chronic kidney disease, without long-term current use of insulin (HCC) -     POCT HgB A1C -     Blood Glucose Monitoring Suppl DEVI; 1 each by Does not apply route in the morning, at noon, and at bedtime. May substitute to any manufacturer covered by patient's insurance. -     Glucose Blood (BLOOD GLUCOSE TEST STRIPS) STRP; 1 each by In Vitro route in the morning, at noon, and at bedtime. May substitute to any manufacturer covered by patient's insurance. -     Lancet Device MISC; 1 each by Does not apply route in the morning, at noon, and at bedtime. May substitute to any manufacturer covered by patient's insurance. -     Lancets MISC; 1 each by Does not apply route as directed. Dispense based on patient  and insurance preference. Use up to four times daily as directed. (FOR ICD-10 E10.9, E11.9).  PAF (paroxysmal atrial fibrillation) (HCC)  Essential hypertension, benign -     amLODipine  (NORVASC ) 2.5 MG tablet; Take 1 tablet (2.5 mg total) by mouth daily. For blood pressure.  Recurrent major depressive disorder, in partial remission  Arthralgia, unspecified joint -     traMADol  (ULTRAM ) 50 MG tablet; Take one to two tablets as needed every 12 hours for pain.   Assessment & Plan Essential hypertension Blood pressure elevated due to missed doses of hydrochlorothiazide  and bosentan. Emphasized daily medication adherence to prevent complications. - Rechecked blood pressure with minimal improvement - Encouraged use of a medication case to organize pills. - Reinforced the importance of taking blood pressure medication daily.  PAF Controlled with no recent episodes - Continue Xarelto  for blood clot prevention - Continue metoprolol  for rate control  Type 2 diabetes mellitus with diabetic chronic kidney disease Blood sugar levels well-controlled with HbA1c of 6.5%. On metformin  and Farxiga . No recent home blood sugar monitoring reported. - Encouraged regular home blood sugar monitoring. New machine ordered.  - On STATIN.  - BP not to goal - Needs to schedule eye doctor appt - Flu/pneumonia UTD  Chronic venous insufficiency  Leg condition improved with new socks from the [place]. Current socks are loose and could be tighter for better compression. - Ensure socks are tight enough to provide proper compression.  Chronic low back pain - Tramadol  as needed  Tobacco use Continues to smoke despite known health risks.     Return in about 2 weeks (around 05/24/2024) for nurse visit BP check .    Kaien Pezzullo, PA-C  "

## 2024-05-15 MED ORDER — METOPROLOL TARTRATE 50 MG PO TABS: ORAL_TABLET | ORAL | 3 refills | Status: AC

## 2024-05-15 NOTE — Addendum Note (Signed)
 Addended by: ANTONIETTE VERMELL CROME on: 05/15/2024 06:24 AM   Modules accepted: Orders

## 2024-05-24 ENCOUNTER — Ambulatory Visit (INDEPENDENT_AMBULATORY_CARE_PROVIDER_SITE_OTHER)

## 2024-05-24 ENCOUNTER — Other Ambulatory Visit: Payer: Self-pay

## 2024-05-24 VITALS — BP 129/62 | HR 73 | Ht 70.0 in

## 2024-05-24 DIAGNOSIS — J4489 Other specified chronic obstructive pulmonary disease: Secondary | ICD-10-CM

## 2024-05-24 DIAGNOSIS — I10 Essential (primary) hypertension: Secondary | ICD-10-CM | POA: Diagnosis not present

## 2024-05-24 MED ORDER — ALBUTEROL SULFATE HFA 108 (90 BASE) MCG/ACT IN AERS: 2.0000 | INHALATION_SPRAY | Freq: Four times a day (QID) | RESPIRATORY_TRACT | 1 refills | Status: AC | PRN

## 2024-05-24 NOTE — Progress Notes (Signed)
" ° °  Established Patient Office Visit  Subjective   Patient ID: Raymond Hardin, male    DOB: December 27, 1940  Age: 84 y.o. MRN: [id number]  Chief Complaint  Patient presents with   Hypertension    BP check nurse visit.     HPI  Hypertension- BP check nurse visit. Patient denies chest pain , palpitations, headaches,  dizziness, vision changes or medicaiton problems. Patient states has been slightly short of breath but feels this is related to cold symptoms. Patient does states blood pressure will fluctuate throughout the day and varies in each arm . Patient states he has restarted the  hydrochlorothiazide  and Bosentan ( Bosentan not showing in current med list and patient is not aware of strength of medication)   ROS    Objective:     BP 129/62 (BP Location: Left Arm, Patient Position: Sitting, Cuff Size: Normal)   Pulse 73   Ht 5' 10 (1.778 m)   SpO2 96%   BMI 36.16 kg/m    Physical Exam   No results found for any visits on 05/24/24.    The ASCVD Risk score (Arnett DK, et al., 2019) failed to calculate for the following reasons:   The 2019 ASCVD risk score is only valid for ages 1 to 18   * - Cholesterol units were assumed    Assessment & Plan:  BP check 129/62.  Patient asked to keep a home BP Log -checking BP once per day and bring to upcoming appt with Jade Breeback in 3 months. Prescription refill of Albuterol  HFA sent to pharmacy for patient per protocol ( patient states Trelegy was stopped and the Ventolin  was sent in to replace this ) Per Dr. Alvan  will need to scheduled with Jade Breeback for when due next for DM check up ( was last seen for this on 05/10/2024-patient scheduled for 08/23/2024 for this appt.  Problem List Items Addressed This Visit       Cardiovascular and Mediastinum   Essential hypertension, benign - Primary    Return in about 13 weeks (around 08/23/2024) for DM and HTN follow up with Jade Breeback,PA.    Suzen SHAUNNA Plenty, LPN  "

## 2024-05-24 NOTE — Patient Instructions (Signed)
 Patient to return when next DM check is due - patient has been scheduled for 08/23/2024. He will keep a home BP log and bring to this appointment as he states he BP fluctuates throughout the day and varies in each arm. Patient will contact the office if any of these numbers should be concerning .

## 2024-08-23 ENCOUNTER — Ambulatory Visit: Admitting: Physician Assistant
